# Patient Record
Sex: Male | Born: 1960 | Race: White | Hispanic: No | Marital: Married | Smoking: Former smoker
Health system: Southern US, Community
[De-identification: ages and names within clinical notes are randomized; demographics above are authoritative.]

## PROBLEM LIST (undated history)

## (undated) DIAGNOSIS — C3490 Malignant neoplasm of unspecified part of unspecified bronchus or lung: Secondary | ICD-10-CM

## (undated) DIAGNOSIS — J449 Chronic obstructive pulmonary disease, unspecified: Secondary | ICD-10-CM

## (undated) DIAGNOSIS — E785 Hyperlipidemia, unspecified: Secondary | ICD-10-CM

## (undated) DIAGNOSIS — M2550 Pain in unspecified joint: Secondary | ICD-10-CM

## (undated) DIAGNOSIS — E119 Type 2 diabetes mellitus without complications: Secondary | ICD-10-CM

## (undated) DIAGNOSIS — G4730 Sleep apnea, unspecified: Secondary | ICD-10-CM

## (undated) DIAGNOSIS — Z8709 Personal history of other diseases of the respiratory system: Secondary | ICD-10-CM

## (undated) HISTORY — PX: WISDOM TOOTH EXTRACTION: SHX21

## (undated) HISTORY — DX: Type 2 diabetes mellitus without complications: E11.9

## (undated) HISTORY — PX: BRONCHOSCOPY: SUR163

## (undated) HISTORY — DX: Chronic obstructive pulmonary disease, unspecified: J44.9

---
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
## 1999-08-24 HISTORY — PX: VASECTOMY: SHX75

## 2014-11-27 ENCOUNTER — Ambulatory Visit
Admit: 2014-11-27 | Disposition: A | Discharge status: Home / Self Care | Payer: Self-pay | Attending: Unknown Physician Specialty | Admitting: Unknown Physician Specialty

## 2014-12-20 ENCOUNTER — Ambulatory Visit
Admit: 2014-12-20 | Disposition: A | Discharge status: Home / Self Care | Payer: Self-pay | Attending: Unknown Physician Specialty | Admitting: Unknown Physician Specialty

## 2014-12-26 ENCOUNTER — Inpatient Hospital Stay
Payer: No Typology Code available for payment source | Attending: Cardiothoracic Surgery | Admitting: Cardiothoracic Surgery

## 2014-12-26 ENCOUNTER — Encounter (INDEPENDENT_AMBULATORY_CARE_PROVIDER_SITE_OTHER): Payer: Self-pay

## 2014-12-26 ENCOUNTER — Encounter: Payer: Self-pay | Admitting: Cardiothoracic Surgery

## 2014-12-26 VITALS — BP 138/82 | HR 63 | Temp 97.8°F | Resp 18 | Ht 71.0 in | Wt 208.3 lb

## 2014-12-26 DIAGNOSIS — R918 Other nonspecific abnormal finding of lung field: Secondary | ICD-10-CM | POA: Diagnosis not present

## 2014-12-26 NOTE — Progress Notes (Signed)
Patient ID: Timothy Benitez, male   DOB: [birth year], 54 y.o.   MRN: [id number]  Chief Complaint  Patient presents with  . PT Initial Evaluation    Abnormal Chest CT    HPI Location, Quality, Duration, Severity, Timing, Context, Modifying Factors, Associated Signs and Symptoms.  Timothy Benitez is a 54 y.o. male.  He presents today for evaluation of a left lung mass. The patient's primary care physician is Dr. Janae Benitez and miss Timothy Benitez. Patient carries a diagnosis of COPD for the last several years. He states that he saw Timothy Benitez for evaluation of a cough. The cause of his history of COPD a chest x-ray was obtained. The chest x-ray was abnormal which led to a CT scan. The CT scan showed a left upper lobe mass with atelectasis of the left upper lobe. The patient is sent here for further evaluation. This has been going on for the last several weeks. There are no associated symptoms. He denied any significant hemoptysis weight loss fever or chills. He denies any chest pain. The cough has now abated.  He does have a history of smoking 1-2 packs cigarettes a day. He quit smoking about a year ago but currently smokes cigars. He drinks 1-2 alcoholic drinks per week. He is a Administrator. HPI  Past Medical History  Diagnosis Date  . COPD (chronic obstructive pulmonary disease)   . Heart murmur     Past Surgical History  Procedure Laterality Date  . Tonsilectomy 1969    . Wisdom teeth extraction 1989      Family History  Problem Relation Age of Onset  . Leukemia Father     Social History History  Substance Use Topics  . Smoking status: Former Smoker -- 1.50 packs/day for 34 years    Types: Cigarettes    Quit date: 09/27/2013  . Smokeless tobacco: Never Used  . Alcohol Use: 0.6 oz/week    1 Cans of beer per week     Comment: 2-3 every couple weeks    No Known Allergies  Current Outpatient Prescriptions  Medication Sig Dispense Refill  . atorvastatin (LIPITOR) 20 MG tablet  Take 20 mg by mouth daily.    . budesonide-formoterol (SYMBICORT) 160-4.5 MCG/ACT inhaler Inhale 2 puffs into the lungs 2 (two) times daily.     No current facility-administered medications for this visit.      Review of Systems A 10 point review of systems was asked and was negative except for the following positive findings cough, wheezing. All other review of systems were negative  Blood pressure 138/82, pulse 63, temperature 97.8 F (36.6 C), temperature source Oral, resp. rate 18, height '5\' 11"'$  (1.803 m), weight 208 lb 5.4 oz (94.5 kg), SpO2 98 %.  Physical Exam CONSTITUTIONAL:  Pleasant, well-developed, well-nourished, and in no acute distress. EYES: Pupils equal and reactive to light, Sclera non-icteric EARS, NOSE, MOUTH AND THROAT:  The oropharynx was clear.  Dentition is poor repair.  Oral mucosa pink and moist. LYMPH NODES:  Lymph nodes in the neck and axillae were normal RESPIRATORY:  Lungs were clear with the exception of rhonchi present on the left posteriorly.  Normal respiratory effort without pathologic use of accessory muscles of respiration CARDIOVASCULAR: Heart was regular without murmurs.  There were no carotid bruits. GI: The abdomen was soft, nontender, and nondistended. There were no palpable masses. There was no hepatosplenomegaly. There were normal bowel sounds in all quadrants. GU:  Rectal deferred.   MUSCULOSKELETAL:  Normal muscle strength  and tone.  No clubbing or cyanosis.   SKIN:  There were no pathologic skin lesions.  There were no nodules on palpation with the exception of a 2-3 cm sebaceous cyst just off the midline at approximately the third intercostal space on the right. NEUROLOGIC:  Sensation is normal.  Cranial nerves are grossly intact. PSYCH:  Oriented to person, place and time.  Mood and affect are normal.  Data Reviewed I have personally reviewed the patient's imaging.    Assessment    I have independently reviewed the patient's chest CT.  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    Plan    At the present time I would like to obtain a bronchoscopy by Dr. Louie Bun. I would also like to get a complete set of pulmonary function studies. The patient will return in one week to discuss these findings.        [name] 12/26/2014, 12:50 PM

## 2014-12-26 NOTE — Progress Notes (Signed)
Met with patient at thoracic surgery consultation. Reviewed plan of care and introduced navigation program. Will assist in coordination of care, including arranging for bronchoscopy vs EBUS by Dr. Mortimer Fries.

## 2014-12-30 ENCOUNTER — Other Ambulatory Visit: Payer: No Typology Code available for payment source

## 2014-12-30 ENCOUNTER — Ambulatory Visit: Payer: No Typology Code available for payment source | Attending: Cardiothoracic Surgery

## 2014-12-30 ENCOUNTER — Telehealth: Payer: Self-pay | Admitting: *Deleted

## 2014-12-30 DIAGNOSIS — R918 Other nonspecific abnormal finding of lung field: Secondary | ICD-10-CM | POA: Diagnosis not present

## 2014-12-30 NOTE — Telephone Encounter (Signed)
Notified of preadmission call planned for 5/11 from 1-5pm and procedure on 5/12. Verbalized understanding.

## 2015-01-01 ENCOUNTER — Inpatient Hospital Stay: Admission: RE | Admit: 2015-01-01 | Payer: No Typology Code available for payment source | Source: Ambulatory Visit

## 2015-01-01 ENCOUNTER — Encounter: Payer: Self-pay | Admitting: *Deleted

## 2015-01-01 DIAGNOSIS — D3A090 Benign carcinoid tumor of the bronchus and lung: Secondary | ICD-10-CM | POA: Diagnosis not present

## 2015-01-01 DIAGNOSIS — Z79899 Other long term (current) drug therapy: Secondary | ICD-10-CM | POA: Diagnosis not present

## 2015-01-01 DIAGNOSIS — R918 Other nonspecific abnormal finding of lung field: Secondary | ICD-10-CM | POA: Diagnosis present

## 2015-01-01 DIAGNOSIS — Z9889 Other specified postprocedural states: Secondary | ICD-10-CM | POA: Diagnosis not present

## 2015-01-01 DIAGNOSIS — J449 Chronic obstructive pulmonary disease, unspecified: Secondary | ICD-10-CM | POA: Diagnosis not present

## 2015-01-01 DIAGNOSIS — Z806 Family history of leukemia: Secondary | ICD-10-CM | POA: Diagnosis not present

## 2015-01-01 DIAGNOSIS — F17290 Nicotine dependence, other tobacco product, uncomplicated: Secondary | ICD-10-CM | POA: Diagnosis not present

## 2015-01-01 DIAGNOSIS — R011 Cardiac murmur, unspecified: Secondary | ICD-10-CM | POA: Diagnosis not present

## 2015-01-01 NOTE — Patient Instructions (Signed)
  Your procedure is scheduled on: 01-01-15 Report to [facility] 2nd floor To find out your arrival time please call [phone] between 1PM - 3PM on  01-01-15  Remember: Instructions that are not followed completely may result in serious medical risk, up to and including death, or upon the discretion of your surgeon and anesthesiologist your surgery may need to be rescheduled.    __x__ 1. Do not eat food or drink liquids after midnight. No gum chewing or hard candies.     __x__ 2. No Alcohol for 24 hours before or after surgery.   ____ 3. Bring all medications with you on the day of surgery if instructed.    ____ 4. Notify your doctor if there is any change in your medical condition     (cold, fever, infections).     Do not wear jewelry, make-up, hairpins, clips or nail polish.  Do not wear lotions, powders, or perfumes. You may wear deodorant.  Do not shave 48 hours prior to surgery. Men may shave face and neck.  Do not bring valuables to the hospital.    [facility] is not responsible for any belongings or valuables.               Contacts, dentures or bridgework may not be worn into surgery.  Leave your suitcase in the car. After surgery it may be brought to your room.  For patients admitted to the hospital, discharge time is determined by your treatment team.   Patients discharged the day of surgery will not be allowed to drive home.   Please read over the following fact sheets that you were given:     __x__ Take these medicines the morning of surgery with A SIP OF WATER:    1.Symbicort  2.   3.   4.  5.  6.  ____ Fleet Enema (as directed)   ____ Use CHG Soap as directed  ____ Use inhalers on the day of surgery  ____ Stop metformin 2 days prior to surgery    ____ Take 1/2 of usual insulin dose the night before surgery and none on the morning of surgery.   ____ Stop Coumadin/Plavix/aspirin on   ____ Stop Anti-inflammatories on    ____ Stop  supplements until after surgery.    ____ Bring C-Pap to the hospital.

## 2015-01-02 ENCOUNTER — Ambulatory Visit
Admission: RE | Admit: 2015-01-02 | Discharge: 2015-01-02 | Disposition: A | Discharge status: Home / Self Care | Payer: No Typology Code available for payment source | Source: Ambulatory Visit | Attending: Internal Medicine | Admitting: Internal Medicine

## 2015-01-02 ENCOUNTER — Inpatient Hospital Stay: Payer: No Typology Code available for payment source | Admitting: Anesthesiology

## 2015-01-02 ENCOUNTER — Encounter: Payer: Self-pay | Admitting: *Deleted

## 2015-01-02 ENCOUNTER — Encounter
Admission: RE | Disposition: A | Discharge status: Home / Self Care | Payer: Self-pay | Source: Ambulatory Visit | Attending: Internal Medicine

## 2015-01-02 ENCOUNTER — Ambulatory Visit: Payer: Self-pay | Admitting: Cardiothoracic Surgery

## 2015-01-02 DIAGNOSIS — R011 Cardiac murmur, unspecified: Secondary | ICD-10-CM | POA: Insufficient documentation

## 2015-01-02 DIAGNOSIS — J449 Chronic obstructive pulmonary disease, unspecified: Secondary | ICD-10-CM | POA: Insufficient documentation

## 2015-01-02 DIAGNOSIS — J432 Centrilobular emphysema: Secondary | ICD-10-CM

## 2015-01-02 DIAGNOSIS — Z79899 Other long term (current) drug therapy: Secondary | ICD-10-CM | POA: Insufficient documentation

## 2015-01-02 DIAGNOSIS — D3A090 Benign carcinoid tumor of the bronchus and lung: Secondary | ICD-10-CM | POA: Diagnosis not present

## 2015-01-02 DIAGNOSIS — Z806 Family history of leukemia: Secondary | ICD-10-CM | POA: Insufficient documentation

## 2015-01-02 DIAGNOSIS — Z9889 Other specified postprocedural states: Secondary | ICD-10-CM | POA: Insufficient documentation

## 2015-01-02 DIAGNOSIS — F17290 Nicotine dependence, other tobacco product, uncomplicated: Secondary | ICD-10-CM | POA: Insufficient documentation

## 2015-01-02 DIAGNOSIS — R918 Other nonspecific abnormal finding of lung field: Secondary | ICD-10-CM

## 2015-01-02 HISTORY — DX: Sleep apnea, unspecified: G47.30

## 2015-01-02 HISTORY — PX: ENDOBRONCHIAL ULTRASOUND: SHX5096

## 2015-01-02 SURGERY — ENDOBRONCHIAL ULTRASOUND (EBUS)
Anesthesia: General

## 2015-01-02 MED ORDER — FENTANYL CITRATE (PF) 100 MCG/2ML IJ SOLN: 25.0000 ug | INTRAMUSCULAR | Status: DC | PRN

## 2015-01-02 MED ORDER — FAMOTIDINE 20 MG PO TABS
20.0000 mg | ORAL_TABLET | Freq: Once | ORAL | Status: AC
  Administered 2015-01-02: 20 mg via ORAL

## 2015-01-02 MED ORDER — FAMOTIDINE 20 MG PO TABS
ORAL_TABLET | ORAL | Status: AC
  Administered 2015-01-02: 20 mg via ORAL
  Filled 2015-01-02: qty 1

## 2015-01-02 MED ORDER — LACTATED RINGERS IV SOLN: INTRAVENOUS | Status: DC

## 2015-01-02 MED ORDER — LIDOCAINE HCL (CARDIAC) 20 MG/ML IV SOLN
INTRAVENOUS | Status: DC | PRN
  Administered 2015-01-02 (×2): 60 mg via INTRAVENOUS

## 2015-01-02 MED ORDER — SUCCINYLCHOLINE CHLORIDE 20 MG/ML IJ SOLN
INTRAMUSCULAR | Status: DC | PRN
  Administered 2015-01-02: 100 mg via INTRAVENOUS

## 2015-01-02 MED ORDER — LACTATED RINGERS IV SOLN
INTRAVENOUS | Status: DC
  Administered 2015-01-02: 12:00:00 via INTRAVENOUS

## 2015-01-02 MED ORDER — FAMOTIDINE 20 MG PO TABS: 20.0000 mg | ORAL_TABLET | Freq: Once | ORAL | Status: DC

## 2015-01-02 MED ORDER — MIDAZOLAM HCL 2 MG/2ML IJ SOLN
INTRAMUSCULAR | Status: DC | PRN
  Administered 2015-01-02: 2 mg via INTRAVENOUS

## 2015-01-02 MED ORDER — FENTANYL CITRATE (PF) 100 MCG/2ML IJ SOLN
INTRAMUSCULAR | Status: DC | PRN
  Administered 2015-01-02: 50 ug via INTRAVENOUS
  Administered 2015-01-02: 100 ug via INTRAVENOUS

## 2015-01-02 MED ORDER — ONDANSETRON HCL 4 MG/2ML IJ SOLN
INTRAMUSCULAR | Status: DC | PRN
  Administered 2015-01-02: 4 mg via INTRAVENOUS

## 2015-01-02 MED ORDER — ONDANSETRON HCL 4 MG/2ML IJ SOLN: 4.0000 mg | Freq: Once | INTRAMUSCULAR | Status: DC | PRN

## 2015-01-02 MED ORDER — ROCURONIUM BROMIDE 100 MG/10ML IV SOLN
INTRAVENOUS | Status: DC | PRN
  Administered 2015-01-02: 10 mg via INTRAVENOUS

## 2015-01-02 MED ORDER — PROPOFOL 10 MG/ML IV BOLUS
INTRAVENOUS | Status: DC | PRN
  Administered 2015-01-02: 200 mg via INTRAVENOUS

## 2015-01-02 NOTE — Anesthesia Postprocedure Evaluation (Signed)
  Anesthesia Post-op Note  Patient: Timothy Benitez  Procedure(s) Performed: Procedure(s): ENDOBRONCHIAL ULTRASOUND (N/A)  Anesthesia type:General  Patient location: PACU  Post pain: Pain level controlled  Post assessment: Post-op Vital signs reviewed, Patient's Cardiovascular Status Stable, Respiratory Function Stable, Patent Airway and No signs of Nausea or vomiting  Post vital signs: Reviewed and stable  Last Vitals:  Filed Vitals:   01/02/15 1354  BP:   Pulse:   Temp: 36.5 C  Resp:     Level of consciousness: awake, alert  and patient cooperative  Complications: No apparent anesthesia complications

## 2015-01-02 NOTE — Transfer of Care (Signed)
Immediate Anesthesia Transfer of Care Note  Patient: Timothy Benitez  Procedure(s) Performed: Procedure(s): ENDOBRONCHIAL ULTRASOUND (N/A)  Patient Location: PACU  Anesthesia Type:General  Level of Consciousness: awake, alert  and oriented  Airway & Oxygen Therapy: Patient Spontanous Breathing and Patient connected to face mask oxygen  Post-op Assessment: Report given to RN, Post -op Vital signs reviewed and stable and Patient moving all extremities X 4  Post vital signs: Reviewed and stable  Last Vitals:  Filed Vitals:   01/02/15 1102  BP: 128/72  Pulse: 69  Temp: 36.3 C  Resp: 16    Complications: No apparent anesthesia complications

## 2015-01-02 NOTE — Anesthesia Preprocedure Evaluation (Signed)
Anesthesia Evaluation  Patient identified by MRN, date of birth, ID band Patient awake    Reviewed: Allergy & Precautions, NPO status , Patient's Chart, lab work & pertinent test results, reviewed documented beta blocker date and time   Airway Mallampati: III  TM Distance: >3 FB Neck ROM: Full    Dental  (+) Chipped, Missing   Pulmonary sleep apnea , COPDformer smoker,          Cardiovascular + Valvular Problems/Murmurs     Neuro/Psych    GI/Hepatic   Endo/Other    Renal/GU      Musculoskeletal   Abdominal   Peds  Hematology   Anesthesia Other Findings Very poor teeth  Reproductive/Obstetrics                             Anesthesia Physical Anesthesia Plan  ASA: III  Anesthesia Plan: General   Post-op Pain Management:    Induction: Intravenous  Airway Management Planned: LMA and Oral ETT  Additional Equipment:   Intra-op Plan:   Post-operative Plan:   Informed Consent: I have reviewed the patients History and Physical, chart, labs and discussed the procedure including the risks, benefits and alternatives for the proposed anesthesia with the patient or authorized representative who has indicated his/her understanding and acceptance.     Plan Discussed with: CRNA and Surgeon  Anesthesia Plan Comments:         Anesthesia Quick Evaluation

## 2015-01-02 NOTE — Discharge Instructions (Signed)
Flexible Bronchoscopy, Care After Refer to this sheet in the next few weeks. These instructions provide you with information on caring for yourself after your procedure. Your health care provider may also give you more specific instructions. Your treatment has been planned according to current medical practices, but problems sometimes occur. Call your health care provider if you have any problems or questions after your procedure.  WHAT TO EXPECT AFTER THE PROCEDURE It is normal to have the following symptoms for 24-48 hours after the procedure:   Increased cough.  Low-grade fever.  Sore throat or hoarse voice.  Small streaks of blood in your thick spit (sputum) if tissue samples were taken (biopsy). HOME CARE INSTRUCTIONS   Do not eat or drink anything for 2 hours after your procedure. Your nose and throat were numbed by medicine. If you try to eat or drink before the medicine wears off, food or drink could go into your lungs or you could burn yourself. After the numbness is gone and your cough and gag reflexes have returned, you may eat soft food and drink liquids slowly.   The day after the procedure, you can go back to your normal diet.   You may resume normal activities.   Keep all follow-up visits as directed by your health care provider. It is important to keep all your appointments, especially if tissue samples were taken for testing (biopsy). SEEK IMMEDIATE MEDICAL CARE IF:   You have increasing shortness of breath.   You become light-headed or faint.   You have chest pain.   You have any new concerning symptoms.  You cough up more than a small amount of blood.  The amount of blood you cough up increases. MAKE SURE YOU:  Understand these instructions.  Will watch your condition.  Will get help right away if you are not doing well or get worse. Document Released: 02/26/2005 Document Revised: 12/24/2013 Document Reviewed: 04/13/2013 [facility] Patient Information  2015 [place], [place]. This information is not intended to replace advice given to you by your health care provider. Make sure you discuss any questions you have with your health care provider. Flexible Bronchoscopy, Care After Refer to this sheet in the next few weeks. These instructions provide you with information on caring for yourself after your procedure. Your health care provider may also give you more specific instructions. Your treatment has been planned according to current medical practices, but problems sometimes occur. Call your health care provider if you have any problems or questions after your procedure.  WHAT TO EXPECT AFTER THE PROCEDURE It is normal to have the following symptoms for 24-48 hours after the procedure:   Increased cough.  Low-grade fever.  Sore throat or hoarse voice.  Small streaks of blood in your thick spit (sputum) if tissue samples were taken (biopsy). HOME CARE INSTRUCTIONS   Do not eat or drink anything for 2 hours after your procedure. Your nose and throat were numbed by medicine. If you try to eat or drink before the medicine wears off, food or drink could go into your lungs or you could burn yourself. After the numbness is gone and your cough and gag reflexes have returned, you may eat soft food and drink liquids slowly.   The day after the procedure, you can go back to your normal diet.   You may resume normal activities.   Keep all follow-up visits as directed by your health care provider. It is important to keep all your appointments, especially if tissue  samples were taken for testing (biopsy). SEEK IMMEDIATE MEDICAL CARE IF:   You have increasing shortness of breath.   You become light-headed or faint.   You have chest pain.   You have any new concerning symptoms.  You cough up more than a small amount of blood.  The amount of blood you cough up increases. MAKE SURE YOU:  Understand these instructions.  Will watch your  condition.  Will get help right away if you are not doing well or get worse. Document Released: 02/26/2005 Document Revised: 12/24/2013 Document Reviewed: 04/13/2013 [facility] Patient Information 2015 [place], [place]. This information is not intended to replace advice given to you by your health care provider. Make sure you discuss any questions you have with your health care provider. General Anesthesia, Care After Refer to this sheet in the next few weeks. These instructions provide you with information on caring for yourself after your procedure. Your health care provider may also give you more specific instructions. Your treatment has been planned according to current medical practices, but problems sometimes occur. Call your health care provider if you have any problems or questions after your procedure. WHAT TO EXPECT AFTER THE PROCEDURE After the procedure, it is typical to experience:  Sleepiness.  Nausea and vomiting. HOME CARE INSTRUCTIONS  For the first 24 hours after general anesthesia:  Have a responsible person with you.  Do not drive a car. If you are alone, do not take public transportation.  Do not drink alcohol.  Do not take medicine that has not been prescribed by your health care provider.  Do not sign important papers or make important decisions.  You may resume a normal diet and activities as directed by your health care provider.  Change bandages (dressings) as directed.  If you have questions or problems that seem related to general anesthesia, call the hospital and ask for the anesthetist or anesthesiologist on call. SEEK MEDICAL CARE IF:  You have nausea and vomiting that continue the day after anesthesia.  You develop a rash. SEEK IMMEDIATE MEDICAL CARE IF:   You have difficulty breathing.  You have chest pain.  You have any allergic problems. Document Released: 11/15/2000 Document Revised: 08/14/2013 Document Reviewed: 02/22/2013 [facility]  Patient Information 2015 [place], [place]. This information is not intended to replace advice given to you by your health care provider. Make sure you discuss any questions you have with your health care provider.

## 2015-01-02 NOTE — H&P (View-Only) (Signed)
Patient ID: Timothy Benitez, male   DOB: 1961/03/07, 54 y.o.   MRN: [id number]  Chief Complaint  Patient presents with  . PT Initial Evaluation    Abnormal Chest CT    HPI Location, Quality, Duration, Severity, Timing, Context, Modifying Factors, Associated Signs and Symptoms.  Timothy Benitez is a 54 y.o. male.  He presents today for evaluation of a left lung mass. The patient's primary care physician is Dr. Janae Bridgeman and miss [name]. Patient carries a diagnosis of COPD for the last several years. He states that he saw [name] for evaluation of a cough. The cause of his history of COPD a chest x-ray was obtained. The chest x-ray was abnormal which led to a CT scan. The CT scan showed a left upper lobe mass with atelectasis of the left upper lobe. The patient is sent here for further evaluation. This has been going on for the last several weeks. There are no associated symptoms. He denied any significant hemoptysis weight loss fever or chills. He denies any chest pain. The cough has now abated.  He does have a history of smoking 1-2 packs cigarettes a day. He quit smoking about a year ago but currently smokes cigars. He drinks 1-2 alcoholic drinks per week. He is a Administrator. HPI  Past Medical History  Diagnosis Date  . COPD (chronic obstructive pulmonary disease)   . Heart murmur     Past Surgical History  Procedure Laterality Date  . Tonsilectomy 1969    . Wisdom teeth extraction 1989      Family History  Problem Relation Age of Onset  . Leukemia Father     Social History History  Substance Use Topics  . Smoking status: Former Smoker -- 1.50 packs/day for 34 years    Types: Cigarettes    Quit date: 09/27/2013  . Smokeless tobacco: Never Used  . Alcohol Use: 0.6 oz/week    1 Cans of beer per week     Comment: 2-3 every couple weeks    No Known Allergies  Current Outpatient Prescriptions  Medication Sig Dispense Refill  . atorvastatin (LIPITOR) 20 MG tablet  Take 20 mg by mouth daily.    . budesonide-formoterol (SYMBICORT) 160-4.5 MCG/ACT inhaler Inhale 2 puffs into the lungs 2 (two) times daily.     No current facility-administered medications for this visit.      Review of Systems A 10 point review of systems was asked and was negative except for the following positive findings cough, wheezing. All other review of systems were negative  Blood pressure 138/82, pulse 63, temperature 97.8 F (36.6 C), temperature source Oral, resp. rate 18, height '5\' 11"'$  (1.803 m), weight 208 lb 5.4 oz (94.5 kg), SpO2 98 %.  Physical Exam CONSTITUTIONAL:  Pleasant, well-developed, well-nourished, and in no acute distress. EYES: Pupils equal and reactive to light, Sclera non-icteric EARS, NOSE, MOUTH AND THROAT:  The oropharynx was clear.  Dentition is poor repair.  Oral mucosa pink and moist. LYMPH NODES:  Lymph nodes in the neck and axillae were normal RESPIRATORY:  Lungs were clear with the exception of rhonchi present on the left posteriorly.  Normal respiratory effort without pathologic use of accessory muscles of respiration CARDIOVASCULAR: Heart was regular without murmurs.  There were no carotid bruits. GI: The abdomen was soft, nontender, and nondistended. There were no palpable masses. There was no hepatosplenomegaly. There were normal bowel sounds in all quadrants. GU:  Rectal deferred.   MUSCULOSKELETAL:  Normal muscle strength  and tone.  No clubbing or cyanosis.   SKIN:  There were no pathologic skin lesions.  There were no nodules on palpation with the exception of a 2-3 cm sebaceous cyst just off the midline at approximately the third intercostal space on the right. NEUROLOGIC:  Sensation is normal.  Cranial nerves are grossly intact. PSYCH:  Oriented to person, place and time.  Mood and affect are normal.  Data Reviewed I have personally reviewed the patient's imaging.    Assessment    I have independently reviewed the patient's chest CT.  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    Plan    At the present time I would like to obtain a bronchoscopy by Dr. Louie Bun. I would also like to get a complete set of pulmonary function studies. The patient will return in one week to discuss these findings.        Timothy Benitez 12/26/2014, 12:50 PM

## 2015-01-02 NOTE — Interval H&P Note (Signed)
History and Physical Interval Note:  01/02/2015 11:36 AM  Timothy Benitez  has presented today for surgery, with the diagnosis of LEFT LUNG MASS  The various methods of treatment have been discussed with the patient. After consideration of risks, benefits and other options for treatment, the patient has consented to  Procedure(s): ENDOBRONCHIAL ULTRASOUND (N/A)/Bronchoscopy  as a surgical intervention .  The patient's history has been reviewed, patient examined, no change in status, stable for surgery.  I have reviewed the patient's chart and labs.  Questions were answered to the patient's satisfaction.     Flora Lipps

## 2015-01-02 NOTE — Anesthesia Procedure Notes (Signed)
Procedure Name: Intubation Date/Time: 01/02/2015 12:32 PM Performed by: Delaney Meigs Pre-anesthesia Checklist: Patient identified, Emergency Drugs available, Suction available, Patient being monitored and Timeout performed Patient Re-evaluated:Patient Re-evaluated prior to inductionOxygen Delivery Method: Circle system utilized Preoxygenation: Pre-oxygenation with 100% oxygen Intubation Type: IV induction Ventilation: Mask ventilation without difficulty Laryngoscope Size: Mac and 3 Grade View: Grade II Tube type: Oral Tube size: 8.5 mm Number of attempts: 1 Placement Confirmation: ETT inserted through vocal cords under direct vision,  positive ETCO2 and breath sounds checked- equal and bilateral Secured at: 23 cm Tube secured with: Tape Dental Injury: Teeth and Oropharynx as per pre-operative assessment  Comments: Teeth in extremely poor conditions

## 2015-01-02 NOTE — Op Note (Signed)
  PROCEDURE: BRONCHOSCOPY Therapeutic Aspiration of Tracheobronchial Tree  PROCEDURE DATE: 01/02/2015    NAME:  Timothy Benitez  DOB:09-05-1960  MRN: [id number] LOC:  ARPO/None    HOSP DAY: '@LENGTHOFSTAYDAYS'$ @ CODE STATUS:        Indications/Preliminary Diagnosis:   Consent: (Place X beside choice/s below)  The benefits, risks and possible complications of the procedure were        explained to:  _x__ patient  ___ patient's family  ___ other:___________  who verbalized understanding and gave:  ___ verbal  ___ written  _x__ verbal and written  ___ telephone  ___ other:________ consent.      Unable to obtain consent; procedure performed on emergent basis.     Other:       PRESEDATION ASSESSMENT: History and Physical has been performed. Patient meds and allergies have been reviewed. Presedation airway examination has been performed and documented. Baseline vital signs, sedation score, oxygenation status, and cardiac rhythm were reviewed. Patient was deemed to be in satisfactory condition to undergo the procedure.   PREMEDICATIONS: SEE ANESTHESIOLOGY RECORDS  Sedative/Narcotic Amt Dose   Versed  mg   Fentanyl  mcg  Diprivan  mg        Airway Prep (Place X beside choice below)   1% Transtracheal Lidocaine Anesthetization 7 cc   Patient prepped per Bronchoscopy Lab Policy       Insertion Route (Place X beside choice below)   Nasal   Oral   Endotracheal Tube   Tracheostomy   INTRAPROCEDURE MEDICATIONS:  Sedative/Narcotic Amt Dose   Versed  mg   Fentanyl  mcg  Diprivan  mg       Medication Amt Dose  Medication Amt Dose  Lidocaine 1%  cc  Epinephrine 1:10,000 sol  cc  Xylocaine 4%  cc  Cocaine  cc   TECHNICAL PROCEDURES: (Place X beside choice below)   Procedures  Description    None     Electrocautery     Cryotherapy     Balloon Dilatation     Bronchography     Stent Placement     Therapeutic Aspiration     Laser/Argon Plasma    Brachytherapy Catheter  Placement    Foreign Body Removal     PROCEDURE DETAILS: Timeout performed and correct patient, name, & ID confirmed. Following prep per Pulmonary policy, appropriate sedation was administered. The Bronchoscope was inserted in to endotracheal tube. The Airway was inspected(Rt lung and Left Lung). There Was a large Endobronchial Tumor/mass located at proximal orifice of Left upper Lobe with t 100% occlusion of airway. The lesion was friable and bleeds easily. Airway exam proceeded with findings, technical procedures, and specimen collection as noted below. At the end of exam the scope was withdrawn without incident. Impression and Plan as noted below.       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 Pulmonary & [place] Director Intensive Care Unit

## 2015-01-06 LAB — SURGICAL PATHOLOGY

## 2015-01-09 ENCOUNTER — Encounter: Payer: Self-pay | Admitting: Cardiothoracic Surgery

## 2015-01-09 ENCOUNTER — Inpatient Hospital Stay: Payer: No Typology Code available for payment source | Admitting: Cardiothoracic Surgery

## 2015-01-09 VITALS — BP 136/78 | HR 71 | Temp 97.0°F | Resp 20 | Ht 71.0 in | Wt 205.0 lb

## 2015-01-09 DIAGNOSIS — R918 Other nonspecific abnormal finding of lung field: Secondary | ICD-10-CM | POA: Diagnosis not present

## 2015-01-09 DIAGNOSIS — C3490 Malignant neoplasm of unspecified part of unspecified bronchus or lung: Secondary | ICD-10-CM

## 2015-01-09 DIAGNOSIS — C3412 Malignant neoplasm of upper lobe, left bronchus or lung: Secondary | ICD-10-CM

## 2015-01-09 NOTE — Addendum Note (Signed)
Addended by: Nestor Lewandowsky E on: 01/09/2015 10:54 AM   Modules accepted: Orders

## 2015-01-09 NOTE — Progress Notes (Signed)
Patient ID: Timothy Benitez, male   DOB: [birth year], 54 y.o.   MRN: [id number]  HISTORY: He states that he feels better today and he has a long time. He feels better after his bronchoscopy with more respiratory reserve. He was able to perform activities that he's not been able to perform in quite a while. Denied any fevers or chills or hemoptysis.   PERTINENT REVIEW OF SYSTEMS: ROS He does have more functional capacity and he has in the past. He denied any fevers chills. He does have some shortness of breath but it is not as severe. He has had no cough. He does describe a smooth strange feeling anteriorly along his left anterior chest wall after the bronchoscopy with this too has improved.  Filed Vitals:   01/09/15 0829  BP: 136/78  Pulse: 71  Temp: 97 F (36.1 C)  Resp: 20   Wt Readings from Last 3 Encounters:  01/09/15 92.987 kg (205 lb)  01/02/15 95.255 kg (210 lb)  12/26/14 94.5 kg (208 lb 5.4 oz)    EXAM: Head: Normocephalic and atraumatic.  Eyes:  Conjunctivae are normal. Pupils are equal, round, and reactive to light. No scleral icterus.  Neck:  Normal range of motion. Neck supple. No tracheal deviation present. No thyromegaly present.  Resp: Lungs are clear bilaterally with bibasilar rhonchi.  No respiratory distress, normal effort. Heart:  Regular without murmurs Abd:  Abdomen is soft, non distended and non tender. No masses are palpable.  There is no rebound and no guarding.  Neurological: Alert and oriented to person, place, and time. Coordination normal.  Skin: Skin is warm and dry. No rash noted. No diaphoretic. No erythema. No pallor.  Psychiatric: Normal mood and affect. Normal behavior. Judgment and thought content normal.      ASSESSMENT: I had a long discussion with the patient today. I did share with him the bronchoscopy results. 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 I was present for his bronchoscopy last week. The tumor is just within the  orifice of the left upper lobe. We attempted to pass a subsequent round the tumor to see if it was attached more distally in the bronchus but we could not do so. This would imply that the tumor is actually right at the orifice to the left upper lobe. It may not be possible to perform a standard lobectomy and may require a sleeve resection and/or a pneumonectomy. I told the patient that I would like to refer him to Dr. Ceasar Mons at [facility]. I explained to the patient that they have excellent facilities at [facility]. I did review with him the options. I would like to continue his follow-up care here at [facility]. We discussed the operative morbidity and mortality of the various options. I told him that Dr. Servando Snare would need to determine some of these intraoperatively as to how he will proceed. I also discussed with him the nonsurgical options which are poor.  PLAN:   We will obtain a PET scan today. We will also refer the patient to Dr. Ceasar Mons for surgical consultation. I would like to continue his follow-up. [facility] postoperatively if Dr. Servando Snare is agreeable.    Nestor Lewandowsky, [place] at [facility] Follow Up Visit

## 2015-01-10 ENCOUNTER — Telehealth: Payer: Self-pay | Admitting: *Deleted

## 2015-01-10 NOTE — Telephone Encounter (Signed)
Oncology Nurse Navigator Documentation  Oncology Nurse Navigator Flowsheets 01/10/2015  Navigator Encounter Type Telephone  Patient Visit Type Surgery  Time Spent with Patient 30   Per patient request discussed plan of care with sister.

## 2015-01-14 ENCOUNTER — Other Ambulatory Visit: Payer: Self-pay | Admitting: Cardiothoracic Surgery

## 2015-01-14 ENCOUNTER — Ambulatory Visit
Admission: RE | Admit: 2015-01-14 | Discharge: 2015-01-14 | Disposition: A | Discharge status: Home / Self Care | Payer: No Typology Code available for payment source | Source: Ambulatory Visit | Attending: Cardiothoracic Surgery | Admitting: Cardiothoracic Surgery

## 2015-01-14 ENCOUNTER — Other Ambulatory Visit: Payer: Self-pay | Admitting: *Deleted

## 2015-01-14 DIAGNOSIS — C3490 Malignant neoplasm of unspecified part of unspecified bronchus or lung: Secondary | ICD-10-CM | POA: Diagnosis not present

## 2015-01-14 DIAGNOSIS — R918 Other nonspecific abnormal finding of lung field: Secondary | ICD-10-CM

## 2015-01-14 DIAGNOSIS — M5134 Other intervertebral disc degeneration, thoracic region: Secondary | ICD-10-CM | POA: Diagnosis not present

## 2015-01-14 DIAGNOSIS — M5136 Other intervertebral disc degeneration, lumbar region: Secondary | ICD-10-CM | POA: Diagnosis not present

## 2015-01-14 DIAGNOSIS — C3412 Malignant neoplasm of upper lobe, left bronchus or lung: Secondary | ICD-10-CM

## 2015-01-14 DIAGNOSIS — J9811 Atelectasis: Secondary | ICD-10-CM | POA: Insufficient documentation

## 2015-01-14 LAB — CYTOLOGY - NON PAP

## 2015-01-14 LAB — GLUCOSE, CAPILLARY: Glucose-Capillary: 106 mg/dL — ABNORMAL HIGH (ref 65–99)

## 2015-01-14 MED ORDER — FLUDEOXYGLUCOSE F - 18 (FDG) INJECTION
12.3800 | Freq: Once | INTRAVENOUS | Status: AC | PRN
  Administered 2015-01-14: 12.38 via INTRAVENOUS

## 2015-01-17 ENCOUNTER — Encounter: Payer: Self-pay | Admitting: Cardiothoracic Surgery

## 2015-01-17 ENCOUNTER — Institutional Professional Consult (permissible substitution) (INDEPENDENT_AMBULATORY_CARE_PROVIDER_SITE_OTHER): Payer: No Typology Code available for payment source | Admitting: Cardiothoracic Surgery

## 2015-01-17 DIAGNOSIS — R918 Other nonspecific abnormal finding of lung field: Secondary | ICD-10-CM | POA: Diagnosis not present

## 2015-01-17 DIAGNOSIS — J984 Other disorders of lung: Secondary | ICD-10-CM

## 2015-01-17 NOTE — Progress Notes (Signed)
[address]       [place] [address]             [phone]                    Inocencio F Munyan [place] Medical Record #[id number] Date of Birth: Feb 03, 1961  Referring: Nestor Lewandowsky, MD Primary Care: [name], NP  Chief Complaint:    Chief Complaint  Patient presents with  . Lung Mass    Surgical evel, PET Scan 01/13/18, Chest CT 12/20/14, PFT's 12/30/14    History of Present Illness:    Timothy Benitez 54 y.o. male is seen in the office  today for recent diagnosis of carcinoid  of the left upper lobe. He was seen by primary care  for evaluation of a cough.  cause  A chest x-ray was obtained. The chest x-ray was abnormal which led to a CT scan. The CT scan showed a left upper lobe mass with atelectasis of the left upper lobe. The patient is sent here for further evaluation.  There are no associated symptoms. He denied any significant hemoptysis weight loss fever or chills. He denies any chest pain. The cough has now abated. He does have a history of smoking 1-2 packs cigarettes a day. He quit smoking  a year ago but currently smokes cigars.  1-2 alcoholic drinks per week. 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 Recent bronchoscopy done   From Bronch:  No images in EPIC :The Bronchoscope was inserted in to endotracheal tube. The Airway was inspected(Rt lung and Left Lung). There Was a large Endobronchial Tumor/mass located at proximal orifice of Left upper Lobe with t 100% occlusion of airway. The lesion was friable and bleeds easily. Airway exam proceeded with findings, technical procedures, and specimen collection as noted below. At the end of exam the scope was withdrawn without incident. Impression and Plan as noted below.       From [facility] at [place]:  A. LUNG, LEFT UPPER LOBE; BRONCHOSCOPY WITH BIOPSY:  - CARCINOID TUMOR.   Comment:  The neoplastic cells  demonstrate diffuse and strong membranous staining  with CD 56. Ki-67 labels less than 5% of neoplastic cells. Less than 2  mitoses identified in 2 mm area. Necrosis is not identified.   Current Activity/ Functional Status:  Patient is independent with mobility/ambulation, transfers, ADL's, IADL's.   Zubrod Score: At the time of surgery this patient's most appropriate activity status/level should be described as: []     0    Normal activity, no symptoms []     1    Restricted in physical strenuous activity but ambulatory, able to do out light work []     2    Ambulatory and capable of self care, unable to do work activities, up and about               >50 % of waking hours                              []     3    Only limited self care, in bed greater than 50% of waking hours []     4    Completely disabled, no self care, confined to bed or chair []     5    Moribund  Past Medical History  Diagnosis Date  . COPD (chronic obstructive pulmonary disease)   . Heart murmur   . Sleep apnea     "mild" never needed CPAP  . Carcinoid tumor of lung   . Cancer     Lung Cancer    Past Surgical History  Procedure Laterality Date  . Tonsilectomy 1969    . Wisdom teeth extraction 1989    . Vasectomy      Family History  Problem Relation Age of Onset  . Leukemia Father       History  Smoking status  . Former Smoker -- 1.50 packs/day for 34 years  . Types: Cigarettes  . Quit date: 09/27/2013  Smokeless tobacco  . Never Used    History  Alcohol Use  . 0.6 oz/week  . 1 Cans of beer per week    Comment: 2-3 every couple weeks     No Known Allergies  Current Outpatient Prescriptions  Medication Sig Dispense Refill  . atorvastatin (LIPITOR) 20 MG tablet Take 20 mg by mouth daily.    Marland Kitchen b complex vitamins tablet Take 1 tablet by mouth daily.    . budesonide-formoterol (SYMBICORT) 160-4.5 MCG/ACT inhaler Inhale 2 puffs into the lungs 2 (two) times daily.    . vitamin A 10000  UNIT capsule Take 10,000 Units by mouth daily.     No current facility-administered medications for this visit.     Review of Systems:     Cardiac Review of Systems: Y or N  Chest Pain [  N  ]  Resting SOB [ N  ] Exertional SOB  [Y  ]  Orthopnea Aqua.Slicker  ]   Pedal Edema [ N  ]    Palpitations [ N ] Syncope  [ N ]   Presyncope [N   ]  General Review of Systems: [Y] = yes [  ]=no Constitional: recent weight change [ N ];  Wt loss over the last 3 months [   ] anorexia [  ]; fatigue [  ]; nausea [  ]; night sweats Aqua.Slicker  ]; fever [  ]; or chills [  ];          Dental: poor dentition[ [place] ]; Last Dentist visit: does not know   Eye : blurred vision Aqua.Slicker  ]; diplopia [   ]; vision changes [  ];  Amaurosis fugax[  ]; Resp: cough [Y  ];  wheezing[N  ];  hemoptysis[ N ]; shortness of breath[ N ]; paroxysmal nocturnal dyspnea[  ]; dyspnea on exertion[  ]; or orthopnea[  ];  GI:  gallstones[  ], vomiting[  ];  dysphagia[  ]; melena[  ];  hematochezia [  ]; heartburn[  ];   Hx of  Colonoscopy[  N]; GU: kidney stones [  ]; hematuria[  ];   dysuria [  ];  nocturia[  ];  history of     obstruction [ N ]; urinary frequency [N  ]             Skin: rash, swelling[  ];, hair loss[  ];  peripheral edema[  ];  or itching[  ]; Musculosketetal: myalgias[  ];  joint swelling[  ];  joint erythema[  ];  joint pain[  ];  back pain[  ];  Heme/Lymph: bruising[  ];  bleeding[  ];  anemia[  ];  Neuro: TIA[  ];  headaches[  ];  stroke[  ];  vertigo[  ];  seizures[ N ];   paresthesias[  ];  difficulty walking[ N ];  Psych:depression[  ]; anxiety[  ];  Endocrine: diabetes[N  ];  thyroid dysfunction[ N ];  Immunizations: Flu up to date Aqua.Slicker  ]; Pneumococcal up to date [facility]  ];  Other:  Physical Exam: BP 135/72 mmHg  Pulse 68  Resp 20  Ht 5' 11"  (1.803 m)  Wt 207 lb (93.895 kg)  BMI 28.88 kg/m2  SpO2 93%  PHYSICAL EXAMINATION: General appearance: alert, cooperative, appears stated age and no distress Head:  Normocephalic, without obvious abnormality, atraumatic Neck: no adenopathy, no carotid bruit, no JVD, supple, symmetrical, trachea midline and thyroid not enlarged, symmetric, no tenderness/mass/nodules Lymph nodes: Cervical, supraclavicular, and axillary nodes normal. Resp: clear to auscultation bilaterally Back: symmetric, no curvature. ROM normal. No CVA tenderness. Cardio: regular rate and rhythm, S1, S2 normal, no murmur, click, rub or gallop, History of heart murmur, but I do not hear one GI: soft, non-tender; bowel sounds normal; no masses,  no organomegaly Extremities: extremities normal, atraumatic, no cyanosis or edema Neurologic: Grossly normal palpable dp and pt pulses Almost all teeth are broken or loose, several lower teeth are very loose and would be risk of dislodging with intubation and post op respiratory care.   Diagnostic Studies & Laboratory data:     Recent Radiology Findings:   Ct Chest W Contrast  12/20/2014   CLINICAL DATA:  Left lung infiltrate.  EXAM: CT CHEST WITH CONTRAST  TECHNIQUE: Multidetector CT imaging of the chest was performed during intravenous contrast administration.  CONTRAST:  75 mL of Omnipaque 350 intravenously.  COMPARISON:  None.  FINDINGS: No pneumothorax or pleural effusion is noted. There is no evidence of thoracic aortic dissection or aneurysm. Visualized portion of upper abdomen appears normal. No significant osseous abnormality is noted.  4.4 x 2.8 cm subcutaneous cyst is seen in right breast region. There is postobstructive atelectasis of the left upper lobe secondary to enhancing probable endobronchial mass at the origin of the left upper lobe bronchus which measures 1.8 x 1.5 x 1.2 cm. No other significant mediastinal mass or adenopathy is noted.  IMPRESSION: Postobstructive atelectasis of left upper lobe secondary to 1.8 cm probable endobronchial mass at the origin of the left upper lobe bronchus. This is highly concerning for neoplasm or  malignancy. Bronchoscopy is recommended for further evaluation. These results will be called to the ordering clinician or representative by the Radiologist Assistant, and communication documented in the PACS or zVision Dashboard.   Electronically Signed   By: Marijo Conception, M.D.   On: 12/20/2014 09:56   Nm Pet Image Initial (pi) Skull Base To Thigh  01/14/2015   CLINICAL DATA:  Initial treatment strategy for Lung cancer.  EXAM: NUCLEAR MEDICINE PET SKULL BASE TO THIGH  TECHNIQUE: 12.38 mCi F-18 FDG was injected intravenously. Full-ring PET imaging was performed from the skull base to thigh after the radiotracer. CT data was obtained and used for attenuation correction and anatomic localization.  FASTING BLOOD GLUCOSE:  Value: 106 mg/dl  COMPARISON:  12/20/2014  FINDINGS: NECK  No hypermetabolic lymph nodes in the neck.  CHEST  There is expected low level FDG uptake associated with the left upper lobe, biopsy proved endobronchial carcinoid. The SUV max is equal to 2.9. Associated postobstructive pneumonitis/atelectasis involving the left upper lobe is again noted.  ABDOMEN/PELVIS  No abnormal hypermetabolic activity within the liver, pancreas, adrenal glands, or spleen. No hypermetabolic lymph nodes in the abdomen or pelvis.Aortic atherosclerosis  noted.  SKELETON  There is a sclerotic lesion within the left iliac wing measuring 11 mm, image 219 of series 3. No corresponding FDG uptake noted on the PET images. Degenerative disc disease noted within the thoracic and lumbar spine.  IMPRESSION: 1. Expected low level FDG uptake is associated with the left upper lobe endobronchial carcinoid. 2. Postobstructive atelectasis involves the left upper lobe. 3. No evidence for hypermetabolic metastasis.   Electronically Signed   By: Kerby Moors M.D.   On: 01/14/2015 13:19    I have independently reviewed the above radiology studies  and reviewed the findings with the patient.    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  I have discussed with the patient the need for resection , without will result in continued obstruction of left upper lobe and potentially lower lobe.  Patient has had his questions answered and is will to proceed with resection.  The patient dental condition makes him at high risk to aspirated loose broken teeth with manipulations of airway intraop and post op and st increased risk of pulmonary infection postop. I have referred him to Dr Enrique Sack to evaluate his dental situation and proceed with necessary extractions to make pulmonary resection safer. Will proceed with lung resection in the next 2 weeks depending on dental treatment      I  spent 45 minutes counseling the patient face to face and 50% or more the  time was spent in counseling and coordination of care. The total time spent in the appointment was 60 minutes.  [name] MD      [address] ,[place] [address] Office [phone]   Beeper [phone]  01/17/2015 12:57 PM

## 2015-01-22 ENCOUNTER — Ambulatory Visit (HOSPITAL_COMMUNITY): Payer: Self-pay | Admitting: Dentistry

## 2015-01-22 ENCOUNTER — Other Ambulatory Visit (HOSPITAL_COMMUNITY): Payer: Self-pay | Admitting: Dentistry

## 2015-01-22 ENCOUNTER — Encounter (HOSPITAL_COMMUNITY): Payer: Self-pay | Admitting: Dentistry

## 2015-01-22 VITALS — BP 132/52 | HR 63 | Temp 98.0°F

## 2015-01-22 DIAGNOSIS — K08409 Partial loss of teeth, unspecified cause, unspecified class: Secondary | ICD-10-CM

## 2015-01-22 DIAGNOSIS — K045 Chronic apical periodontitis: Secondary | ICD-10-CM | POA: Insufficient documentation

## 2015-01-22 DIAGNOSIS — K029 Dental caries, unspecified: Secondary | ICD-10-CM | POA: Insufficient documentation

## 2015-01-22 DIAGNOSIS — K083 Retained dental root: Secondary | ICD-10-CM | POA: Insufficient documentation

## 2015-01-22 DIAGNOSIS — K0530 Chronic periodontitis, unspecified: Secondary | ICD-10-CM

## 2015-01-22 DIAGNOSIS — D3A090 Benign carcinoid tumor of the bronchus and lung: Secondary | ICD-10-CM | POA: Insufficient documentation

## 2015-01-22 DIAGNOSIS — Z85118 Personal history of other malignant neoplasm of bronchus and lung: Secondary | ICD-10-CM | POA: Insufficient documentation

## 2015-01-22 DIAGNOSIS — Z01818 Encounter for other preprocedural examination: Secondary | ICD-10-CM | POA: Diagnosis not present

## 2015-01-22 DIAGNOSIS — K036 Deposits [accretions] on teeth: Secondary | ICD-10-CM

## 2015-01-22 DIAGNOSIS — K0889 Other specified disorders of teeth and supporting structures: Secondary | ICD-10-CM | POA: Insufficient documentation

## 2015-01-22 DIAGNOSIS — D1430 Benign neoplasm of unspecified bronchus and lung: Secondary | ICD-10-CM

## 2015-01-22 DIAGNOSIS — M264 Malocclusion, unspecified: Secondary | ICD-10-CM

## 2015-01-22 DIAGNOSIS — IMO0002 Reserved for concepts with insufficient information to code with codable children: Secondary | ICD-10-CM

## 2015-01-22 NOTE — Progress Notes (Signed)
DENTAL CONSULTATION  Date of Consultation:  01/22/2015 Patient Name:   Timothy Benitez Date of Birth:   November 19, 1960 Medical Record Number: [id number]  VITALS: BP 132/52 mmHg  Pulse 63  Temp(Src) 98 F (36.7 C) (Oral)  CHIEF COMPLAINT: Patient referred by Dr. Servando Snare for a medically necessary pre-lung surgery dental protocol examination.  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 Patient with anticipated surgical resection with Dr. Servando Snare. Patient referred by Dr. Servando Snare for a pre-lung surgery dental protocol examination of poor dentition to rule out dental infection that may affect the patient's systemic health and the anticipated lung surgery.  The patient currently denies acute toothaches, swellings, or abscesses. Patient indicates that multiple teeth have hurt off and on for many years. Patient has not seen a dentist since approximately 1992. Patient had a "checkup" at that time. Patient did have two lower impacted wisdom teeth that were extracted by an oral surgeon. Patient denies significant complications from those extractions. Patient does not seek regular dental care. Patient is interested in having all teeth removed at this time.  PROBLEM LIST: Patient Active Problem List   Diagnosis Date Noted  . Carcinoid tumor of lung 01/22/2015    Priority: Medium  . Chronic apical periodontitis 01/22/2015  . Retained dental root 01/22/2015  . Dental caries 01/22/2015  . Chronic periodontitis 01/22/2015  . Loose, teeth 01/22/2015  . COPD (chronic obstructive pulmonary disease) 01/02/2015  . Lung mass 01/02/2015    PMH: Past Medical History  Diagnosis Date  . COPD (chronic obstructive pulmonary disease)   . Heart murmur   . Sleep apnea     "mild" never needed CPAP  . Carcinoid tumor of lung   . Cancer     Lung Cancer    PSH: Past Surgical History  Procedure Laterality Date  . Tonsilectomy 1969    . Wisdom teeth  extraction 1989    . Vasectomy      ALLERGIES: No Known Allergies  MEDICATIONS: Current Outpatient Prescriptions  Medication Sig Dispense Refill  . albuterol (PROVENTIL HFA;VENTOLIN HFA) 108 (90 BASE) MCG/ACT inhaler Inhale 1-2 puffs into the lungs every 6 (six) hours as needed for wheezing or shortness of breath.    Marland Kitchen atorvastatin (LIPITOR) 20 MG tablet Take 20 mg by mouth daily.    Marland Kitchen b complex vitamins tablet Take 1 tablet by mouth daily.    . budesonide-formoterol (SYMBICORT) 160-4.5 MCG/ACT inhaler Inhale 2 puffs into the lungs 2 (two) times daily.    . Cholecalciferol (VITAMIN D) 2000 UNITS CAPS Take 1 capsule by mouth daily.     No current facility-administered medications for this visit.    LABS: No results found for: WBC, HGB, HCT, MCV, PLT No results found for: NA, K, CL, CO2, GLUCOSE, BUN, CREATININE, CALCIUM, GFRNONAA, GFRAA No results found for: INR, PROTIME No results found for: PTT  SOCIAL HISTORY: History   Social History  . Marital Status: Married    Spouse Name: N/A  . Number of Children: 5  . Years of Education: N/A   Occupational History  . Not on file.   Social History Main Topics  . Smoking status: Former Smoker -- 1.50 packs/day for 34 years    Types: Cigarettes    Quit date: 09/27/2013  . Smokeless tobacco: Never Used  . Alcohol Use: 0.6 oz/week    1 Cans of beer per week     Comment: 2-3 beers every couple weeks  .  Drug Use: No     Comment: in 1980's and 1990's   . Sexual Activity: Not on file   Other Topics Concern  . Not on file   Social History Narrative    FAMILY HISTORY: Family History  Problem Relation Age of Onset  . Leukemia Father     REVIEW OF SYSTEMS: Reviewed with the patient and is included in the dental record.  DENTAL HISTORY: CHIEF COMPLAINT: Patient referred by Dr. Servando Snare for a medically necessary pre-lung surgery dental protocol examination.  HPI: Timothy Benitez is a 54 year old male recently diagnosed  with carcinoid tumor of the left lung. Patient with anticipated surgical resection with Dr. Servando Snare. Patient referred by Dr. Servando Snare for a pre-lung surgery dental protocol examination of poor dentition to rule out dental infection that may affect the patient's systemic health and the anticipated lung surgery.  The patient currently denies acute toothaches, swellings, or abscesses. Patient indicates that multiple teeth have hurt off and on for many years. Patient has not seen a dentist since approximately 1992. Patient had a "checkup" at that time. Patient did have two lower impacted wisdom teeth that were extracted by an oral surgeon. Patient denies significant complications from those extractions. Patient does not seek regular dental care. Patient is interested in having all teeth removed at this time.   DENTAL EXAMINATION: GENERAL: The patient is a well-developed, well-nourished male in no acute distress. HEAD AND NECK: There is no submandibular lymphadenopathy. The patient denies acute TMJ symptoms. INTRAORAL EXAM: The patient has normal saliva. There is no evidence of oral abscess formation. The patient has mandibular right lingual exostoses in the area of the premolars and area #32. DENTITION: The patient is missing tooth numbers 1, 16, 17, 19, 24, 31, and 32. There are retained roots in the area of tooth numbers 6, 7, 8, 10, 11, 14, 15, 18, 20, 29, and 30. PERIODONTAL: The patient has chronic periodontitis with plaque and calculus accumulations, generalized gingival recession, and generalized tooth mobility as per dental charting form. DENTAL CARIES/SUBOPTIMAL RESTORATIONS: Patient has rampant dental caries affecting the remaining dentition as per dental charting form. ENDODONTIC: Patient currently denies acute pulpitis symptoms. The patient does have periapical pathology associated with tooth numbers 2, 7, 15, CROWN AND BRIDGE: There are no crown or bridge restorations noted. PROSTHODONTIC:  Patient denies having partial dentures. OCCLUSION: Patient has a poor occlusal scheme secondary to multiple missing teeth, multiple retained root segments, supra-eruption and drifting of the unopposed teeth into the edentulous areas, and lack replacement of missing teeth with dental prostheses.  RADIOGRAPHIC INTERPRETATION: Orthopantogram was taken and supplemented with a full series of dental radiographs. There are multiple missing teeth. There are multiple retained root segments. There are multiple areas of periapical pathology and radiolucency. There is moderate to severe bone loss. There is supra-eruption and drifting of the unopposed teeth into the edentulous areas. There are multiple dental caries noted.  ASSESSMENTS: 1. Carcinoid tumor of the left long 2. Pre-lung surgery dental protocol examination  3. Chronic apical periodontitis 4. Rampant dental caries 5. Multiple retained root segments 6. Chronic periodontitis with bone loss 7. Gingival recession 8. Tooth mobility 9. Accretions 10. Multiple missing teeth 11. Supra-eruption and drifting of the unopposed teeth into the edentulous areas 12. Malocclusion   PLAN/RECOMMENDATIONS: 1. I discussed the risks, benefits, and complications of various treatment options with the patient in relationship to his medical and dental conditions, anticipated lung surgery, and risk for infection. We discussed various treatment options to  include no treatment, multiple extractions with alveoloplasty, pre-prosthetic surgery as indicated, periodontal therapy, dental restorations, root canal therapy, crown and bridge therapy, implant therapy, and replacement of missing teeth as indicated. The patient currently wishes to proceed with extraction of remaining teeth with alveoloplasty and pre-prosthetic surgery as needed in the operating room with general anesthesia. This has been scheduled for 01/29/2015 at 8:30 AM at [facility]. The patient will then follow-up  with the dentist of his choice for fabrication of upper lower complete dentures after adequate healing and once medically stable from the anticipated lung surgery with Dr. Servando Snare. Patient will proceed with lung surgery with Dr. Servando Snare per his discretion after the dental extractions. The patient is aware of the potential complications for bleeding, bruising, swelling, infection, nerve damage, soft tissue damage, mandible fracture, sinus involvement, and complications of general anesthesia. Patient is aware of the other potential complications not necessarily mentioned above.  2. Discussion of findings with medical team and coordination of future medical and dental care as needed.  I spent in excess of  120 minutes during the conduct of this consultation and >50% of this time involved direct face-to-face encounter for counseling and/or coordination of the patient's care.    Lenn Cal, DDS

## 2015-01-22 NOTE — Patient Instructions (Signed)
Patient is scheduled for surgery next Wednesday, 01/29/2015 8:30 AM at [facility]. Patient to call if he has problems before then. Patient to follow the dentist of his choice for fabrication of upper and lower complete dentures after adequate healing and once medically stable from anticipated lung surgery. Dr. Enrique Sack

## 2015-01-28 MED ORDER — CEFAZOLIN SODIUM-DEXTROSE 2-3 GM-% IV SOLR
2.0000 g | INTRAVENOUS | Status: AC
  Administered 2015-01-29: 2 g via INTRAVENOUS
  Filled 2015-01-28: qty 50

## 2015-01-29 ENCOUNTER — Ambulatory Visit (HOSPITAL_COMMUNITY)
Admission: RE | Admit: 2015-01-29 | Discharge: 2015-01-29 | Disposition: A | Discharge status: Home / Self Care | Payer: No Typology Code available for payment source | Source: Ambulatory Visit | Attending: Dentistry | Admitting: Dentistry

## 2015-01-29 ENCOUNTER — Ambulatory Visit (HOSPITAL_COMMUNITY): Payer: No Typology Code available for payment source | Admitting: Anesthesiology

## 2015-01-29 ENCOUNTER — Encounter (HOSPITAL_COMMUNITY)
Admission: RE | Disposition: A | Discharge status: Home / Self Care | Payer: Self-pay | Source: Ambulatory Visit | Attending: Dentistry

## 2015-01-29 DIAGNOSIS — K083 Retained dental root: Secondary | ICD-10-CM | POA: Insufficient documentation

## 2015-01-29 DIAGNOSIS — Z7951 Long term (current) use of inhaled steroids: Secondary | ICD-10-CM | POA: Insufficient documentation

## 2015-01-29 DIAGNOSIS — K029 Dental caries, unspecified: Secondary | ICD-10-CM | POA: Diagnosis not present

## 2015-01-29 DIAGNOSIS — I4510 Unspecified right bundle-branch block: Secondary | ICD-10-CM | POA: Insufficient documentation

## 2015-01-29 DIAGNOSIS — Z87891 Personal history of nicotine dependence: Secondary | ICD-10-CM | POA: Insufficient documentation

## 2015-01-29 DIAGNOSIS — C7A090 Malignant carcinoid tumor of the bronchus and lung: Secondary | ICD-10-CM | POA: Diagnosis present

## 2015-01-29 DIAGNOSIS — K088 Other specified disorders of teeth and supporting structures: Secondary | ICD-10-CM | POA: Diagnosis not present

## 2015-01-29 DIAGNOSIS — K045 Chronic apical periodontitis: Secondary | ICD-10-CM | POA: Insufficient documentation

## 2015-01-29 DIAGNOSIS — R9431 Abnormal electrocardiogram [ECG] [EKG]: Secondary | ICD-10-CM | POA: Insufficient documentation

## 2015-01-29 DIAGNOSIS — G4730 Sleep apnea, unspecified: Secondary | ICD-10-CM | POA: Insufficient documentation

## 2015-01-29 DIAGNOSIS — K0530 Chronic periodontitis, unspecified: Secondary | ICD-10-CM

## 2015-01-29 DIAGNOSIS — Z79899 Other long term (current) drug therapy: Secondary | ICD-10-CM | POA: Insufficient documentation

## 2015-01-29 DIAGNOSIS — D1430 Benign neoplasm of unspecified bronchus and lung: Secondary | ICD-10-CM

## 2015-01-29 DIAGNOSIS — M278 Other specified diseases of jaws: Secondary | ICD-10-CM | POA: Insufficient documentation

## 2015-01-29 DIAGNOSIS — J449 Chronic obstructive pulmonary disease, unspecified: Secondary | ICD-10-CM | POA: Insufficient documentation

## 2015-01-29 DIAGNOSIS — K0889 Other specified disorders of teeth and supporting structures: Secondary | ICD-10-CM

## 2015-01-29 HISTORY — PX: MULTIPLE EXTRACTIONS WITH ALVEOLOPLASTY: SHX5342

## 2015-01-29 LAB — POCT I-STAT, CHEM 8
BUN: 15 mg/dL (ref 6–20)
CALCIUM ION: 1.23 mmol/L (ref 1.12–1.23)
Chloride: 103 mmol/L (ref 101–111)
Creatinine, Ser: 0.9 mg/dL (ref 0.61–1.24)
GLUCOSE: 131 mg/dL — AB (ref 65–99)
HCT: 49 % (ref 39.0–52.0)
Hemoglobin: 16.7 g/dL (ref 13.0–17.0)
Potassium: 4.3 mmol/L (ref 3.5–5.1)
SODIUM: 140 mmol/L (ref 135–145)
TCO2: 23 mmol/L (ref 0–100)

## 2015-01-29 LAB — PLATELET COUNT: Platelets: 186 10*3/uL (ref 150–400)

## 2015-01-29 SURGERY — MULTIPLE EXTRACTION WITH ALVEOLOPLASTY
Anesthesia: General | Site: Mouth

## 2015-01-29 MED ORDER — MIDAZOLAM HCL 2 MG/2ML IJ SOLN
INTRAMUSCULAR | Status: AC
  Filled 2015-01-29: qty 2

## 2015-01-29 MED ORDER — HYDROMORPHONE HCL 1 MG/ML IJ SOLN
INTRAMUSCULAR | Status: AC
  Filled 2015-01-29: qty 1

## 2015-01-29 MED ORDER — FENTANYL CITRATE (PF) 250 MCG/5ML IJ SOLN
INTRAMUSCULAR | Status: AC
  Filled 2015-01-29: qty 5

## 2015-01-29 MED ORDER — BUPIVACAINE-EPINEPHRINE 0.5% -1:200000 IJ SOLN
INTRAMUSCULAR | Status: DC | PRN
  Administered 2015-01-29: 3.6 mL

## 2015-01-29 MED ORDER — ONDANSETRON HCL 4 MG/2ML IJ SOLN
INTRAMUSCULAR | Status: AC
  Filled 2015-01-29: qty 2

## 2015-01-29 MED ORDER — NEOSTIGMINE METHYLSULFATE 10 MG/10ML IV SOLN
INTRAVENOUS | Status: AC
  Filled 2015-01-29: qty 1

## 2015-01-29 MED ORDER — OXYMETAZOLINE HCL 0.05 % NA SOLN
NASAL | Status: DC | PRN
  Administered 2015-01-29: 1 via TOPICAL

## 2015-01-29 MED ORDER — PROMETHAZINE HCL 25 MG/ML IJ SOLN: 6.2500 mg | INTRAMUSCULAR | Status: DC | PRN

## 2015-01-29 MED ORDER — LIDOCAINE HCL (CARDIAC) 20 MG/ML IV SOLN
INTRAVENOUS | Status: AC
  Filled 2015-01-29: qty 5

## 2015-01-29 MED ORDER — MIDAZOLAM HCL 5 MG/5ML IJ SOLN
INTRAMUSCULAR | Status: DC | PRN
Start: 2015-01-29 — End: 2015-01-29
  Administered 2015-01-29: 2 mg via INTRAVENOUS

## 2015-01-29 MED ORDER — GLYCOPYRROLATE 0.2 MG/ML IJ SOLN
INTRAMUSCULAR | Status: DC | PRN
  Administered 2015-01-29: 0.4 mg via INTRAVENOUS

## 2015-01-29 MED ORDER — ROCURONIUM BROMIDE 50 MG/5ML IV SOLN
INTRAVENOUS | Status: AC
  Filled 2015-01-29: qty 1

## 2015-01-29 MED ORDER — PROPOFOL 10 MG/ML IV BOLUS
INTRAVENOUS | Status: DC | PRN
  Administered 2015-01-29: 200 mg via INTRAVENOUS

## 2015-01-29 MED ORDER — HYDROMORPHONE HCL 1 MG/ML IJ SOLN
0.2500 mg | INTRAMUSCULAR | Status: DC | PRN
  Administered 2015-01-29 (×2): 0.5 mg via INTRAVENOUS

## 2015-01-29 MED ORDER — PROPOFOL 10 MG/ML IV BOLUS
INTRAVENOUS | Status: AC
  Filled 2015-01-29: qty 20

## 2015-01-29 MED ORDER — BUPIVACAINE-EPINEPHRINE (PF) 0.5% -1:200000 IJ SOLN
INTRAMUSCULAR | Status: AC
  Filled 2015-01-29: qty 3.6

## 2015-01-29 MED ORDER — NEOSTIGMINE METHYLSULFATE 10 MG/10ML IV SOLN
INTRAVENOUS | Status: DC | PRN
  Administered 2015-01-29: 3 mg via INTRAVENOUS

## 2015-01-29 MED ORDER — SODIUM CHLORIDE 0.9 % IJ SOLN
INTRAMUSCULAR | Status: AC
  Filled 2015-01-29: qty 10

## 2015-01-29 MED ORDER — FENTANYL CITRATE (PF) 100 MCG/2ML IJ SOLN
INTRAMUSCULAR | Status: DC | PRN
  Administered 2015-01-29: 50 ug via INTRAVENOUS
  Administered 2015-01-29: 100 ug via INTRAVENOUS
  Administered 2015-01-29 (×2): 50 ug via INTRAVENOUS

## 2015-01-29 MED ORDER — LACTATED RINGERS IV SOLN
INTRAVENOUS | Status: DC | PRN
  Administered 2015-01-29: 08:00:00 via INTRAVENOUS

## 2015-01-29 MED ORDER — OXYMETAZOLINE HCL 0.05 % NA SOLN
NASAL | Status: AC
  Filled 2015-01-29: qty 15

## 2015-01-29 MED ORDER — OXYCODONE-ACETAMINOPHEN 5-325 MG PO TABS: ORAL_TABLET | ORAL | Status: DC

## 2015-01-29 MED ORDER — SUCCINYLCHOLINE CHLORIDE 20 MG/ML IJ SOLN
INTRAMUSCULAR | Status: AC
  Filled 2015-01-29: qty 1

## 2015-01-29 MED ORDER — EPHEDRINE SULFATE 50 MG/ML IJ SOLN
INTRAMUSCULAR | Status: AC
  Filled 2015-01-29: qty 1

## 2015-01-29 MED ORDER — ONDANSETRON HCL 4 MG/2ML IJ SOLN
INTRAMUSCULAR | Status: DC | PRN
  Administered 2015-01-29: 4 mg via INTRAVENOUS

## 2015-01-29 MED ORDER — ROCURONIUM BROMIDE 100 MG/10ML IV SOLN
INTRAVENOUS | Status: DC | PRN
  Administered 2015-01-29: 40 mg via INTRAVENOUS

## 2015-01-29 MED ORDER — LIDOCAINE-EPINEPHRINE 2 %-1:100000 IJ SOLN
INTRAMUSCULAR | Status: DC | PRN
  Administered 2015-01-29: 10.8 mL

## 2015-01-29 MED ORDER — MEPERIDINE HCL 25 MG/ML IJ SOLN: 6.2500 mg | INTRAMUSCULAR | Status: DC | PRN

## 2015-01-29 MED ORDER — 0.9 % SODIUM CHLORIDE (POUR BTL) OPTIME
TOPICAL | Status: DC | PRN
  Administered 2015-01-29: 1000 mL

## 2015-01-29 MED ORDER — GLYCOPYRROLATE 0.2 MG/ML IJ SOLN
INTRAMUSCULAR | Status: AC
  Filled 2015-01-29: qty 2

## 2015-01-29 MED ORDER — LIDOCAINE-EPINEPHRINE 2 %-1:100000 IJ SOLN
INTRAMUSCULAR | Status: AC
  Filled 2015-01-29: qty 10.2

## 2015-01-29 MED ORDER — LIDOCAINE HCL (CARDIAC) 20 MG/ML IV SOLN
INTRAVENOUS | Status: DC | PRN
  Administered 2015-01-29: 60 mg via INTRAVENOUS

## 2015-01-29 SURGICAL SUPPLY — 33 items
ALCOHOL 70% 16 OZ (MISCELLANEOUS) ×2 IMPLANT
ATTRACTOMAT 16X20 MAGNETIC DRP (DRAPES) ×2 IMPLANT
BLADE SURG 15 STRL LF DISP TIS (BLADE) ×2 IMPLANT
BLADE SURG 15 STRL SS (BLADE) ×2
COVER SURGICAL LIGHT HANDLE (MISCELLANEOUS) ×2 IMPLANT
GAUZE PACKING FOLDED 2  STR (GAUZE/BANDAGES/DRESSINGS) ×1
GAUZE PACKING FOLDED 2 STR (GAUZE/BANDAGES/DRESSINGS) ×1 IMPLANT
GAUZE SPONGE 4X4 16PLY XRAY LF (GAUZE/BANDAGES/DRESSINGS) ×4 IMPLANT
GLOVE BIOGEL PI IND STRL 6 (GLOVE) ×1 IMPLANT
GLOVE BIOGEL PI INDICATOR 6 (GLOVE) ×1
GLOVE SURG ORTHO 8.0 STRL STRW (GLOVE) ×2 IMPLANT
GLOVE SURG SS PI 6.0 STRL IVOR (GLOVE) ×2 IMPLANT
GOWN STRL REUS W/ TWL LRG LVL3 (GOWN DISPOSABLE) ×1 IMPLANT
GOWN STRL REUS W/TWL 2XL LVL3 (GOWN DISPOSABLE) ×2 IMPLANT
GOWN STRL REUS W/TWL LRG LVL3 (GOWN DISPOSABLE) ×1
HEMOSTAT SURGICEL 2X14 (HEMOSTASIS) ×2 IMPLANT
KIT BASIN OR (CUSTOM PROCEDURE TRAY) ×2 IMPLANT
KIT ROOM TURNOVER OR (KITS) ×2 IMPLANT
MANIFOLD NEPTUNE WASTE (CANNULA) ×2 IMPLANT
NEEDLE BLUNT 16X1.5 OR ONLY (NEEDLE) ×2 IMPLANT
NS IRRIG 1000ML POUR BTL (IV SOLUTION) ×2 IMPLANT
PACK EENT II TURBAN DRAPE (CUSTOM PROCEDURE TRAY) ×2 IMPLANT
PAD ARMBOARD 7.5X6 YLW CONV (MISCELLANEOUS) ×4 IMPLANT
SPONGE GAUZE 4X4 12PLY STER LF (GAUZE/BANDAGES/DRESSINGS) ×2 IMPLANT
SPONGE SURGIFOAM ABS GEL 100 (HEMOSTASIS) ×2 IMPLANT
SPONGE SURGIFOAM ABS GEL 12-7 (HEMOSTASIS) IMPLANT
SPONGE SURGIFOAM ABS GEL SZ50 (HEMOSTASIS) IMPLANT
SUCTION FRAZIER TIP 10 FR DISP (SUCTIONS) ×2 IMPLANT
SUT CHROMIC 3 0 PS 2 (SUTURE) ×8 IMPLANT
SYR 50ML SLIP (SYRINGE) ×2 IMPLANT
TOWEL OR 17X26 10 PK STRL BLUE (TOWEL DISPOSABLE) ×2 IMPLANT
TUBE CONNECTING 12X1/4 (SUCTIONS) ×2 IMPLANT
YANKAUER SUCT BULB TIP NO VENT (SUCTIONS) ×2 IMPLANT

## 2015-01-29 NOTE — Op Note (Signed)
OPERATIVE REPORT  Patient:            Timothy Benitez Date of Birth:  [birth year] MRN:                [id number]   DATE OF PROCEDURE:  01/29/2015  PREOPERATIVE DIAGNOSES: 1. Carcinoid tumor of the lung 2. Pre-lung surgery dental protocol 3. Chronic apical periodontitis 4. Chronic periodontitis 5. Dental caries 6. Multiple retained root segments 7. Loose teeth  POSTOPERATIVE DIAGNOSES: 1. Carcinoid tumor of the lung 2. Pre-lung surgery dental protocol 3. Chronic apical periodontitis 4. Chronic periodontitis 5. Dental caries 6. Multiple retained root segments 7. Loose teeth  OPERATIONS: 1. Multiple extraction of tooth numbers 2, 3, 4, 5, 6, 7, 8, 9, 10, 11, 12, 13, 14, 15, 18, 20, 21, 22, 23, 25, 26, 27, 28, 29, and 30 2. 4 Quadrants of alveoloplasty 3. Mandibular right lateral exostoses reductions   SURGEON: Lenn Cal, DDS  ASSISTANT: Camie Patience, (dental assistant)  ANESTHESIA: General anesthesia via nasoendotracheal tube.  MEDICATIONS: 1. Ancef 2 g IV prior to invasive dental procedures. 2. Local anesthesia with a total utilization of 6 carpules each containing 34 mg of lidocaine with 0.017 mg of epinephrine as well as 2 carpules each containing 9 mg of bupivacaine with 0.009 mg of epinephrine.  SPECIMENS: There are 25 teeth that were discarded.  DRAINS: None  CULTURES: None  COMPLICATIONS: None   ESTIMATED BLOOD LOSS: 150 mLs.  INTRAVENOUS FLUIDS: 800 mLs of Lactated ringers solution.  INDICATIONS: The patient was recently diagnosed with carcinoid tumor of the lung.  A medically necessary dental consultation was then requested to evaluate poor dentition and to rule out etiology for aspiration of teeth and risk for dental infection.  The patient was examined and treatment planned for extraction of all remaining teeth with alveoloplasty and pre-prosthetic surgery as indicated in the operating room with general anesthesia.  This treatment plan was  formulated to decrease the risks and complications associated with dental infection from affecting the patient's systemic health and the anticipated lung surgery.  OPERATIVE FINDINGS: Patient was examined operating room number 11.  The teeth were identified for extraction. The patient was noted be affected by chronic periodontitis, chronic apical periodontitis, multiple retained root segments, dental caries, and loose teeth.   DESCRIPTION OF PROCEDURE: Patient was brought to the main operating room number 11. Patient was then placed in the supine position on the operating table. General anesthesia was then induced per the anesthesia team. The patient was then prepped and draped in the usual manner for dental medicine procedure. A timeout was performed. The patient was identified and procedures were verified. A throat pack was placed at this time. The oral cavity was then thoroughly examined with the findings noted above. The patient was then ready for dental medicine procedure as follows:  Local anesthesia was then administered sequentially with a total utilization of 6 carpules each containing 34 mg of lidocaine with 0.017 mg of epinephrine as well as 2 carpules  each containing 9 mg bupivacaine with 0.009 mg of epinephrine.  The Maxillary left and right quadrants first approached. Anesthesia was then delivered utilizing infiltration with lidocaine with epinephrine. A #15 blade incision was then made from the maxillary right tuberosity and extended to the maxillary left tuberosity.  A  surgical flap was then carefully reflected. Appropriate amounts of buccal and interseptal bone were then removed utilizing a surgical handpiece and bur and copious amounts of sterile water.  The maxillary  teeth were then subluxated with a series of straight elevators. Tooth numbers 2, 3, 4, 5, 6, 7, 8, 9, 10, 11, 12, 13, 14, 15, were then removed with a 150 forceps without complications. Alveoloplasty was then performed  utilizing a ronguers and bone file. Excessive granulation tissue was removed from the area of #7 as needed. The surgical site was then irrigated with copious amounts of sterile saline. The tissues were approximated and trimmed appropriately. A piece of Surgifoam was then placed in the extraction sockets as needed. The surgical site was then closed from the maxillary right tuberosity and extended the mesial #8 utilizing 3-0 chromic gut suture in a continuous interrupted suture technique 1. The maxillary left surgical site was then closed from the maxillary left tuberosity and extended the mesial #9 utilizing 3-0 chromic gut suture in a continuous interrupted suture technique 1.  At this point time, the mandibular quadrants were approached. The patient was given bilateral inferior alveolar nerve blocks and long buccal nerve blocks utilizing the bupivacaine with epinephrine. Further infiltration was then achieved utilizing the lidocaine with epinephrine. A 15 blade incision was then made from the distal of number of #17 and extended to the distal of #32.  A surgical flap was then carefully reflected. The lower teeth were then subluxated with a series of straight elevators.  Tooth numbers 18, 20, 21, 22, 23, 25, 26, 27, 28, 29, and 30 were then removed with a 151 forceps and rongeurs as indicated.  Alveoloplasty was then performed utilizing a rongeurs and bone file. The mandibular right quadrant then had the lingual flap further reflected to expose the lateral exostoses. The exostoses were then reduced utilizing a surgical handpiece and bur and copious amounts sterile saline. Further alveoloplasty was then performed utilizing a rongeur and bone file. The tissues were approximated and trimmed appropriately. The surgical sites were then irrigated with copious amounts of sterile saline. A piece of Surgifoam was then placed in the extraction socket as needed. The mandibular left surgical site was then closed from the  distal of  17 and extended to the mesial of #24.  The mandibular right surgical site was then closed from the distal of 32 and extended to the mesial of #25 utilizing 3-0 chromic gut suture in a continuous interrupted suture technique x1.   At this point time, the entire mouth was irrigated with copious amounts of sterile saline. The patient was examined for complications, seeing none, the dental medicine procedure was deemed to be complete. The throat pack was removed at this time. An oral airway was then placed at the request of the anesthesia team. A series of 4 x 4 gauze were placed in the mouth to aid hemostasis. The patient was then handed over to the anesthesia team for final disposition. After an appropriate amount of time, the patient was extubated and taken to the postanesthsia care unit condition. All counts were correct for the dental medicine procedure. The patient was then given a prescription for Percocet 5/325. Patient is to use one to 2 tablets every 4-6 hours as needed for pain. Patient is to return to dental medicine in 7-10 days for evaluation for suture removal. Patient will be seen by Dr. Servando Snare next week and scheduled for his lung surgery as indicated at that time.   Lenn Cal, DDS.

## 2015-01-29 NOTE — Anesthesia Postprocedure Evaluation (Signed)
Anesthesia Post Note  Patient: Timothy Benitez  Procedure(s) Performed: Procedure(s) (LRB): Extraction of tooth #'s 2,3,4,5,6,7,8,9,10,11,12,13,14,15,18,20,21,22,23, 25,26,27,28,29,30 with alveoloplasty and mandibular right lateral exostoses reductions (N/A)  Anesthesia type: General  Patient location: PACU  Post pain: Pain level controlled  Post assessment: Post-op Vital signs reviewed  Last Vitals: BP 141/67 mmHg  Pulse 54  Temp(Src) 36.8 C  Resp 12  SpO2 98%  Post vital signs: Reviewed  Level of consciousness: sedated  Complications: No apparent anesthesia complications

## 2015-01-29 NOTE — Transfer of Care (Signed)
Immediate Anesthesia Transfer of Care Note  Patient: Timothy Benitez  Procedure(s) Performed: Procedure(s): Extraction of tooth #'s 2,3,4,5,6,7,8,9,10,11,12,13,14,15,18,20,21,22,23, 25,26,27,28,29,30 with alveoloplasty and mandibular right lateral exostoses reductions (N/A)  Patient Location: PACU  Anesthesia Type:General  Level of Consciousness: awake, alert  and oriented  Airway & Oxygen Therapy: Patient Spontanous Breathing and Patient connected to face mask oxygen  Post-op Assessment: Report given to RN, Post -op Vital signs reviewed and stable and Patient moving all extremities X 4  Post vital signs: Reviewed and stable  Last Vitals:  Filed Vitals:   01/29/15 0818  BP: 139/72  Pulse: 67  Temp: 36.7 C  Resp: 20    Complications: No apparent anesthesia complications

## 2015-01-29 NOTE — H&P (Signed)
01/29/2015   Patient Name:   Timothy Benitez Date of Birth:   [birth year] Medical Record Number: [id number]   [name] is a 54 year old male with carcinoid tumor of the lung. Patient presents for extraction of remaining teeth with alveoloplasty and pre-prosthetic surgery as indicated in the operating with general anesthesia as part of a pre-lung surgery dental protocol.  Patient denies any acute medical or dental changes. Please use note from Dr. Servando Snare for use as the H&P with operating room procedure today.  Lenn Cal, DDS  Progress Notes    Expand All Collapse All       [address]  [place] [address]  [phone]   [place] F Pritchard [employer] Medical Record #[id number] Date of Birth: 12/17/1960  Referring: Nestor Lewandowsky, MD Primary Care: [name], NP  Chief Complaint:  Chief Complaint  Patient presents with  . Lung Mass    Surgical evel, PET Scan 01/13/18, Chest CT 12/20/14, PFT's 12/30/14    History of Present Illness:  Timothy Benitez 54 y.o. male is seen in the office today for recent diagnosis of carcinoid of the left upper lobe. He was seen by primary care for evaluation of a cough. cause A chest x-ray was obtained. The chest x-ray was abnormal which led to a CT scan. The CT scan showed a left upper lobe mass with atelectasis of the left upper lobe. The patient is sent here for further evaluation. There are no associated symptoms. He denied any significant hemoptysis weight loss fever or chills. He denies any chest pain. The cough has now abated. He does have a history of smoking 1-2 packs cigarettes a day. He quit smoking a year ago but currently smokes cigars. 1-2 alcoholic drinks per week. 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 Recent bronchoscopy done  From  Bronch: No images in EPIC :The Bronchoscope was inserted in to endotracheal tube. The Airway was inspected(Rt lung and Left Lung). There Was a large Endobronchial Tumor/mass located at proximal orifice of Left upper Lobe with t 100% occlusion of airway. The lesion was friable and bleeds easily. Airway exam proceeded with findings, technical procedures, and specimen collection as noted below. At the end of exam the scope was withdrawn without incident. Impression and Plan as noted below.       From [facility]:  A. LUNG, LEFT UPPER LOBE; BRONCHOSCOPY WITH BIOPSY:  - CARCINOID TUMOR.   Comment:  The neoplastic cells demonstrate diffuse and strong membranous staining  with CD 56. Ki-67 labels less than 5% of neoplastic cells. Less than 2  mitoses identified in 2 mm area. Necrosis is not identified.   Current Activity/ Functional Status:  Patient is independent with mobility/ambulation, transfers, ADL's, IADL's.   Zubrod Score: At the time of surgery this patient's most appropriate activity status/level should be described as: [ ]  0 Normal activity, no symptoms [ ]  1 Restricted in physical strenuous activity but ambulatory, able to do out light work [ ]  2 Ambulatory and capable of self care, unable to do work activities, up and about  >50 % of waking hours  [ ]  3 Only limited self care, in bed greater than 50% of waking hours [ ]  4 Completely disabled, no self care, confined to bed or chair [ ]  5 Moribund   Past Medical History  Diagnosis Date  . COPD (chronic obstructive pulmonary  disease)   . Heart murmur   . Sleep apnea     "mild" never needed CPAP  . Carcinoid tumor of lung   . Cancer     Lung Cancer    Past Surgical History  Procedure Laterality Date  . Tonsilectomy 1969    . Wisdom teeth extraction 1989    . Vasectomy       Family History  Problem Relation Age of Onset  . Leukemia Father       History  Smoking status  . Former Smoker -- 1.50 packs/day for 34 years  . Types: Cigarettes  . Quit date: 09/27/2013  Smokeless tobacco  . Never Used    History  Alcohol Use  . 0.6 oz/week  . 1 Cans of beer per week    Comment: 2-3 every couple weeks     No Known Allergies  Current Outpatient Prescriptions  Medication Sig Dispense Refill  . atorvastatin (LIPITOR) 20 MG tablet Take 20 mg by mouth daily.    Marland Kitchen b complex vitamins tablet Take 1 tablet by mouth daily.    . budesonide-formoterol (SYMBICORT) 160-4.5 MCG/ACT inhaler Inhale 2 puffs into the lungs 2 (two) times daily.    . vitamin A 10000 UNIT capsule Take 10,000 Units by mouth daily.     No current facility-administered medications for this visit.     Review of Systems:   Cardiac Review of Systems: Y or N Chest Pain [ N ] Resting SOB [ N ]Exertional SOB [Y ] Orthopnea Aqua.Slicker ] Pedal Edema [ N ] Palpitations [ N ]Syncope [ N ] Presyncope [N ] General Review of Systems: [Y] = yes [ ] =no Constitional: recent weight change [ N ]; Wt loss over the last 3 months [ ]  anorexia [ ] ; fatigue [ ] ; nausea [ ] ; night sweats [N ]; fever [ ] ; or chills [ ] ;  Dental: poor dentition[ VERY POOR WITH LOOSE TEETH ]; Last Dentist visit: does not know  Eye : blurred vision [N ]; diplopia [ ] ; vision changes [ ] ; Amaurosis fugax[ ] ; Resp: cough [Y ]; wheezing[N ]; hemoptysis[ N ]; shortness of breath[ N ]; paroxysmal nocturnal dyspnea[ ] ; dyspnea on exertion[ ] ; or orthopnea[ ] ;  GI: gallstones[ ] , vomiting[ ] ; dysphagia[ ] ; melena[ ] ; hematochezia [ ] ; heartburn[ ] ; Hx of Colonoscopy[ N]; GU: kidney stones [ ] ; hematuria[ ] ; dysuria [ ] ;  nocturia[ ] ; history of obstruction [ N ]; urinary frequency [N ]  Skin: rash, swelling[ ] ;, hair loss[ ] ; peripheral edema[ ] ; or itching[ ] ; Musculosketetal: myalgias[ ] ; joint swelling[ ] ; joint erythema[ ] ; joint pain[ ] ; back pain[ ] ; Heme/Lymph: bruising[ ] ; bleeding[ ] ; anemia[ ] ;  Neuro: TIA[ ] ; headaches[ ] ; stroke[ ] ; vertigo[ ] ; seizures[ N ]; paresthesias[ ] ; difficulty walking[ N ]; Psych:depression[ ] ; anxiety[ ] ; Endocrine: diabetes[N ]; thyroid dysfunction[ N ]; Immunizations: Flu up to date Aqua.Slicker ]; Pneumococcal up to date [facility] ]; Other:  Physical Exam: BP 135/72 mmHg  Pulse 68  Resp 20  Ht 5' 11"  (1.803 m)  Wt 207 lb (93.895 kg)  BMI 28.88 kg/m2  SpO2 93%  PHYSICAL EXAMINATION: General appearance: alert, cooperative, appears stated age and no distress Head: Normocephalic, without obvious abnormality, atraumatic Neck: no adenopathy, no carotid bruit, no JVD, supple, symmetrical, trachea midline and thyroid not enlarged, symmetric, no tenderness/mass/nodules Lymph nodes: Cervical, supraclavicular, and axillary nodes normal. Resp: clear to auscultation bilaterally Back: symmetric, no curvature. ROM normal. No CVA tenderness. Cardio: regular rate and rhythm, S1,  S2 normal, no murmur, click, rub or gallop, History of heart murmur, but I do not hear one GI: soft, non-tender; bowel sounds normal; no masses, no organomegaly Extremities: extremities normal, atraumatic, no cyanosis or edema Neurologic: Grossly normal palpable dp and pt pulses Almost all teeth are broken or loose, several lower teeth are very loose and would be risk of dislodging with intubation and post op respiratory care.   Diagnostic Studies & Laboratory data:   Recent Radiology Findings:   Imaging Results    Ct Chest W Contrast  12/20/2014 CLINICAL DATA: Left lung infiltrate. EXAM:  CT CHEST WITH CONTRAST TECHNIQUE: Multidetector CT imaging of the chest was performed during intravenous contrast administration. CONTRAST: 75 mL of Omnipaque 350 intravenously. COMPARISON: None. FINDINGS: No pneumothorax or pleural effusion is noted. There is no evidence of thoracic aortic dissection or aneurysm. Visualized portion of upper abdomen appears normal. No significant osseous abnormality is noted. 4.4 x 2.8 cm subcutaneous cyst is seen in right breast region. There is postobstructive atelectasis of the left upper lobe secondary to enhancing probable endobronchial mass at the origin of the left upper lobe bronchus which measures 1.8 x 1.5 x 1.2 cm. No other significant mediastinal mass or adenopathy is noted. IMPRESSION: Postobstructive atelectasis of left upper lobe secondary to 1.8 cm probable endobronchial mass at the origin of the left upper lobe bronchus. This is highly concerning for neoplasm or malignancy. Bronchoscopy is recommended for further evaluation. These results will be called to the ordering clinician or representative by the Radiologist Assistant, and communication documented in the PACS or zVision Dashboard. Electronically Signed By: Marijo Conception, M.D. On: 12/20/2014 09:56   Nm Pet Image Initial (pi) Skull Base To Thigh  01/14/2015 CLINICAL DATA: Initial treatment strategy for Lung cancer. EXAM: NUCLEAR MEDICINE PET SKULL BASE TO THIGH TECHNIQUE: 12.38 mCi F-18 FDG was injected intravenously. Full-ring PET imaging was performed from the skull base to thigh after the radiotracer. CT data was obtained and used for attenuation correction and anatomic localization. FASTING BLOOD GLUCOSE: Value: 106 mg/dl COMPARISON: 12/20/2014 FINDINGS: NECK No hypermetabolic lymph nodes in the neck. CHEST There is expected low level FDG uptake associated with the left upper lobe, biopsy proved endobronchial carcinoid. The SUV max is equal to 2.9. Associated postobstructive  pneumonitis/atelectasis involving the left upper lobe is again noted. ABDOMEN/PELVIS No abnormal hypermetabolic activity within the liver, pancreas, adrenal glands, or spleen. No hypermetabolic lymph nodes in the abdomen or pelvis.Aortic atherosclerosis noted. SKELETON There is a sclerotic lesion within the left iliac wing measuring 11 mm, image 219 of series 3. No corresponding FDG uptake noted on the PET images. Degenerative disc disease noted within the thoracic and lumbar spine. IMPRESSION: 1. Expected low level FDG uptake is associated with the left upper lobe endobronchial carcinoid. 2. Postobstructive atelectasis involves the left upper lobe. 3. No evidence for hypermetabolic metastasis. Electronically Signed By: Kerby Moors M.D. On: 01/14/2015 13:19    I have independently reviewed the above radiology studies and reviewed the findings with the patient.    Recent Lab Findings:  Recent Labs    No results found for: WBC, HGB, HCT, PLT, GLUCOSE, CHOL, TRIG, HDL, LDLDIRECT, LDLCALC, ALT, AST, NA, K, CL, CREATININE, BUN, CO2, TSH, INR, GLUF, HGBA1C    PFT's:12/30/2014 FEV1 2.16 58 % DLCO 27.9 96%  Assessment / Plan:  Carcinoid tumor of the left upper lobe, likely will require sleeve resection or poss pneumonectomy. I have discussed with the patient the need for resection , without  will result in continued obstruction of left upper lobe and potentially lower lobe. Patient has had his questions answered and is will to proceed with resection.  The patient dental condition makes him at high risk to aspirated loose broken teeth with manipulations of airway intraop and post op and st increased risk of pulmonary infection postop. I have referred him to Dr Enrique Sack to evaluate his dental situation and proceed with necessary extractions to make pulmonary resection safer. Will proceed with lung resection in the next 2 weeks depending on dental treatment     I spent 45 minutes  counseling the patient face to face and 50% or more the time was spent in counseling and coordination of care. The total time spent in the appointment was 60 minutes.  [name] MD     [address] [place] [address] Office [phone] Beeper [phone]  01/17/2015 12:57 PM

## 2015-01-29 NOTE — Anesthesia Procedure Notes (Signed)
Procedure Name: Intubation Date/Time: 01/29/2015 8:39 AM Performed by: Kyung Rudd Pre-anesthesia Checklist: Patient identified, Emergency Drugs available, Suction available, Patient being monitored and Timeout performed Patient Re-evaluated:Patient Re-evaluated prior to inductionOxygen Delivery Method: Circle system utilized Preoxygenation: Pre-oxygenation with 100% oxygen Intubation Type: IV induction Ventilation: Mask ventilation without difficulty and Oral airway inserted - appropriate to patient size Laryngoscope Size: Mac and 4 Grade View: Grade I Nasal Tubes: Right, Nasal prep performed, Magill forceps- large, utilized and Nasal Rae Tube size: 7.0 mm Number of attempts: 1 Placement Confirmation: ETT inserted through vocal cords under direct vision,  positive ETCO2 and breath sounds checked- equal and bilateral Tube secured with: Tape Dental Injury: Teeth and Oropharynx as per pre-operative assessment

## 2015-01-29 NOTE — Progress Notes (Signed)
PRE-OPERATIVE NOTE:  01/29/2015   Timothy Benitez [id number]    Timothy Benitez presents for multiple dental extractions with alveoloplasty and pre-prosthetic surgery as indicated in the operating room with general anesthesia.    SUBJECTIVE: The patient denies any acute medical or dental changes and agrees to proceed with treatment as planned.  EXAM: No sign of acute dental changes.  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   PLAN: Patient agrees to proceed with treatment as planned in the operating room as previously discussed and accepts the risks, benefits, and complications of the proposed treatment. Patient is aware of the risk for bleeding, bruising, swelling, infection, pain, nerve damage, soft tissue damage, sinus involvement, root tip fracture, mandible fracture, and the risks of complications associated with the anesthesia. Patient is aware of the potential for complications up to and including death due to his overall medical compromise and respiratory compromise. Patient also is aware of the potential for other complications not mentioned above.   Lenn Cal, DDS

## 2015-01-29 NOTE — Anesthesia Preprocedure Evaluation (Addendum)
Anesthesia Evaluation  Patient identified by MRN, date of birth, ID band Patient awake    Reviewed: Allergy & Precautions, NPO status , Patient's Chart, lab work & pertinent test results, reviewed documented beta blocker date and time   Airway Mallampati: II  TM Distance: >3 FB Neck ROM: Full    Dental  (+) Chipped, Missing, Poor Dentition   Pulmonary sleep apnea , COPDformer smoker,  - rhonchi  + decreased breath sounds      Cardiovascular + Valvular Problems/Murmurs Rhythm:Regular     Neuro/Psych negative neurological ROS  negative psych ROS   GI/Hepatic negative GI ROS, Neg liver ROS,   Endo/Other  negative endocrine ROS  Renal/GU negative Renal ROS     Musculoskeletal negative musculoskeletal ROS (+)   Abdominal   Peds  Hematology negative hematology ROS (+)   Anesthesia Other Findings Very poor teeth  Reproductive/Obstetrics negative OB ROS                           Anesthesia Physical  Anesthesia Plan  ASA: III  Anesthesia Plan: General   Post-op Pain Management:    Induction: Intravenous  Airway Management Planned: Oral ETT  Additional Equipment:   Intra-op Plan:   Post-operative Plan: Extubation in OR  Informed Consent: I have reviewed the patients History and Physical, chart, labs and discussed the procedure including the risks, benefits and alternatives for the proposed anesthesia with the patient or authorized representative who has indicated his/her understanding and acceptance.   Dental advisory given  Plan Discussed with: CRNA  Anesthesia Plan Comments:         Anesthesia Quick Evaluation

## 2015-01-29 NOTE — Discharge Instructions (Signed)

## 2015-01-30 ENCOUNTER — Encounter (HOSPITAL_COMMUNITY): Payer: Self-pay | Admitting: Dentistry

## 2015-02-10 ENCOUNTER — Encounter (HOSPITAL_COMMUNITY): Payer: Self-pay | Admitting: Dentistry

## 2015-02-10 ENCOUNTER — Ambulatory Visit (INDEPENDENT_AMBULATORY_CARE_PROVIDER_SITE_OTHER): Payer: No Typology Code available for payment source | Admitting: Cardiothoracic Surgery

## 2015-02-10 ENCOUNTER — Ambulatory Visit (HOSPITAL_COMMUNITY): Payer: Self-pay | Admitting: Dentistry

## 2015-02-10 ENCOUNTER — Encounter: Payer: Self-pay | Admitting: Cardiothoracic Surgery

## 2015-02-10 ENCOUNTER — Other Ambulatory Visit: Payer: Self-pay | Admitting: *Deleted

## 2015-02-10 VITALS — BP 130/62 | HR 64 | Temp 98.1°F

## 2015-02-10 VITALS — BP 136/70 | HR 65 | Resp 20 | Ht 71.0 in | Wt 205.0 lb

## 2015-02-10 DIAGNOSIS — R918 Other nonspecific abnormal finding of lung field: Secondary | ICD-10-CM

## 2015-02-10 DIAGNOSIS — J984 Other disorders of lung: Secondary | ICD-10-CM

## 2015-02-10 DIAGNOSIS — K08109 Complete loss of teeth, unspecified cause, unspecified class: Secondary | ICD-10-CM

## 2015-02-10 DIAGNOSIS — T148XXD Other injury of unspecified body region, subsequent encounter: Secondary | ICD-10-CM

## 2015-02-10 DIAGNOSIS — D1430 Benign neoplasm of unspecified bronchus and lung: Secondary | ICD-10-CM

## 2015-02-10 DIAGNOSIS — Z01818 Encounter for other preprocedural examination: Secondary | ICD-10-CM

## 2015-02-10 DIAGNOSIS — K0820 Unspecified atrophy of edentulous alveolar ridge: Secondary | ICD-10-CM

## 2015-02-10 MED ORDER — CHLORHEXIDINE GLUCONATE 0.12 % MT SOLN: OROMUCOSAL | Status: DC

## 2015-02-10 NOTE — Progress Notes (Signed)
[address]       [place] [address]             [phone]                    [name] [facility] Medical Record #[id number] Date of Birth: 10/27/1960  Referring: [name], NP Primary Care: [name], NP  Chief Complaint:    Chief Complaint  Patient presents with  . Lung Mass    Further discuss surgery    History of Present Illness:    Timothy Benitez 54 y.o. male is seen in the office last week for recent diagnosis of carcinoid  of the left upper lobe. He was seen by primary care  for evaluation of a cough.  cause  A chest x-ray was obtained. The chest x-ray was abnormal which led to a CT scan. The CT scan showed a left upper lobe mass with atelectasis of the left upper lobe. The patient is sent here for further evaluation.  There are no associated symptoms. He denied any significant hemoptysis weight loss fever or chills. He denies any chest pain. The cough has now abated. He does have a history of smoking 1-2 packs cigarettes a day. He quit smoking  a year ago but currently smokes cigars.  1-2 alcoholic drinks per week. 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 Recent bronchoscopy done   From Bronch:  No images in EPIC :The Bronchoscope was inserted in to endotracheal tube. The Airway was inspected(Rt lung and Left Lung). There Was a large Endobronchial Tumor/mass located at proximal orifice of Left upper Lobe with t 100% occlusion of airway. The lesion was friable and bleeds easily. Airway exam proceeded with findings, technical procedures, and specimen collection as noted below. At the end of exam the scope was withdrawn without incident. Impression and Plan as noted below.       From [facility] at [facility]:  A. LUNG, LEFT UPPER LOBE; BRONCHOSCOPY WITH BIOPSY:  - CARCINOID TUMOR.   Comment:  The neoplastic cells demonstrate diffuse and strong  membranous staining  with CD 56. Ki-67 labels less than 5% of neoplastic cells. Less than 2  mitoses identified in 2 mm area. Necrosis is not identified.   Patient returns today after dental extraction. Dr Enrique Sack reports gums healing well.   Current Activity/ Functional Status:  Patient is independent with mobility/ambulation, transfers, ADL's, IADL's.   Zubrod Score: At the time of surgery this patient's most appropriate activity status/level should be described as: [x]     0    Normal activity, no symptoms []     1    Restricted in physical strenuous activity but ambulatory, able to do out light work []     2    Ambulatory and capable of self care, unable to do work activities, up and about               >50 % of waking hours                              []     3    Only limited self care, in bed greater than 50% of waking hours []     4    Completely disabled, no self care, confined to bed or chair []   5    Moribund   Past Medical History  Diagnosis Date  . COPD (chronic obstructive pulmonary disease)   . Heart murmur   . Sleep apnea     "mild" never needed CPAP  . Carcinoid tumor of lung   . Cancer     Lung Cancer    Past Surgical History  Procedure Laterality Date  . Tonsilectomy 1969    . Wisdom teeth extraction 1989    . Vasectomy    . Multiple extractions with alveoloplasty N/A 01/29/2015    Procedure: Extraction of tooth #'s 2,3,4,5,6,7,8,9,10,11,12,13,14,15,18,20,21,22,23, 25,26,27,28,29,30 with alveoloplasty and mandibular right lateral exostoses reductions;  Surgeon: Lenn Cal, DDS;  Location: [facility];  Service: Oral Surgery;  Laterality: N/A;    Family History  Problem Relation Age of Onset  . Leukemia Father       History  Smoking status  . Former Smoker -- 1.50 packs/day for 34 years  . Types: Cigarettes  . Quit date: 09/27/2013  Smokeless tobacco  . Never Used    History  Alcohol Use  . 0.6 oz/week  . 1 Cans of beer per week    Comment:  2-3 beers every couple weeks     No Known Allergies  Current Outpatient Prescriptions  Medication Sig Dispense Refill  . albuterol (PROVENTIL HFA;VENTOLIN HFA) 108 (90 BASE) MCG/ACT inhaler Inhale 1-2 puffs into the lungs every 6 (six) hours as needed for wheezing or shortness of breath.    Marland Kitchen atorvastatin (LIPITOR) 20 MG tablet Take 20 mg by mouth daily.    Marland Kitchen b complex vitamins tablet Take 1 tablet by mouth daily.    . budesonide-formoterol (SYMBICORT) 160-4.5 MCG/ACT inhaler Inhale 2 puffs into the lungs 2 (two) times daily.    . chlorhexidine (PERIDEX) 0.12 % solution Rinse with 15 mls twice daily for 30 seconds. Use after breakfast and at bedtime. Spit out excess. Do not swallow. 480 mL 6  . Cholecalciferol (VITAMIN D) 2000 UNITS CAPS Take 1 capsule by mouth daily.    Marland Kitchen oxyCODONE-acetaminophen (PERCOCET) 5-325 MG per tablet Take one or two tablets by mouth every 4-6 hours as needed for pain. 40 tablet 0   No current facility-administered medications for this visit.     Review of Systems:     Cardiac Review of Systems: Y or N  Chest Pain [  N  ]  Resting SOB [ N  ] Exertional SOB  [Y  ]  Orthopnea Aqua.Slicker  ]   Pedal Edema [ N  ]    Palpitations [ N ] Syncope  [ N ]   Presyncope Aqua.Slicker   ]  General Review of Systems: [Y] = yes [  ]=no Constitional: recent weight change [ N ];  Wt loss over the last 3 months [   ] anorexia [  ]; fatigue [  ]; nausea [  ]; night sweats Aqua.Slicker  ]; fever [  ]; or chills [  ];          Dental: poor dentition[] ; Last Dentist visit: this mourning   Eye : blurred vision Aqua.Slicker  ]; diplopia [   ]; vision changes [  ];  Amaurosis fugax[  ]; Resp: cough [Y  ];  wheezing[N  ];  hemoptysis[ N ]; shortness of breath[ N ]; paroxysmal nocturnal dyspnea[  ]; dyspnea on exertion[  ]; or orthopnea[  ];  GI:  gallstones[  ], vomiting[  ];  dysphagia[  ]; melena[  ];  hematochezia [  ];  heartburn[  ];   Hx of  Colonoscopy[  N]; GU: kidney stones [  ]; hematuria[  ];   dysuria [  ];   nocturia[  ];  history of     obstruction [ N ]; urinary frequency [N  ]             Skin: rash, swelling[  ];, hair loss[  ];  peripheral edema[  ];  or itching[  ]; Musculosketetal: myalgias[  ];  joint swelling[  ];  joint erythema[  ];  joint pain[  ];  back pain[  ];  Heme/Lymph: bruising[  ];  bleeding[  ];  anemia[  ];  Neuro: TIA[  ];  headaches[  ];  stroke[  ];  vertigo[  ];  seizures[ N ];   paresthesias[  ];  difficulty walking[ N ];  Psych:depression[  ]; anxiety[  ];  Endocrine: diabetes[N  ];  thyroid dysfunction[ N ];  Immunizations: Flu up to date Aqua.Slicker  ]; Pneumococcal up to date [facility]  ];  Other:  Physical Exam: BP 136/70 mmHg  Pulse 65  Resp 20  Ht 5' 11"  (1.803 m)  Wt 205 lb (92.987 kg)  BMI 28.60 kg/m2  SpO2 98%  PHYSICAL EXAMINATION: General appearance: alert, cooperative, appears stated age and no distress Head: Normocephalic, without obvious abnormality, atraumatic Neck: no adenopathy, no carotid bruit, no JVD, supple, symmetrical, trachea midline and thyroid not enlarged, symmetric, no tenderness/mass/nodules Lymph nodes: Cervical, supraclavicular, and axillary nodes normal. Resp: clear to auscultation bilaterally Back: symmetric, no curvature. ROM normal. No CVA tenderness. Cardio: regular rate and rhythm, S1, S2 normal, no murmur, click, rub or gallop, History of heart murmur, but I do not hear one GI: soft, non-tender; bowel sounds normal; no masses,  no organomegaly Extremities: extremities normal, atraumatic, no cyanosis or edema Neurologic: Grossly normal palpable dp and pt pulses Mouth appears healing well.   Diagnostic Studies & Laboratory data:     Recent Radiology Findings:   Nm Pet Image Initial (pi) Skull Base To Thigh  01/14/2015   CLINICAL DATA:  Initial treatment strategy for Lung cancer.  EXAM: NUCLEAR MEDICINE PET SKULL BASE TO THIGH  TECHNIQUE: 12.38 mCi F-18 FDG was injected intravenously. Full-ring PET imaging was performed from the skull  base to thigh after the radiotracer. CT data was obtained and used for attenuation correction and anatomic localization.  FASTING BLOOD GLUCOSE:  Value: 106 mg/dl  COMPARISON:  12/20/2014  FINDINGS: NECK  No hypermetabolic lymph nodes in the neck.  CHEST  There is expected low level FDG uptake associated with the left upper lobe, biopsy proved endobronchial carcinoid. The SUV max is equal to 2.9. Associated postobstructive pneumonitis/atelectasis involving the left upper lobe is again noted.  ABDOMEN/PELVIS  No abnormal hypermetabolic activity within the liver, pancreas, adrenal glands, or spleen. No hypermetabolic lymph nodes in the abdomen or pelvis.Aortic atherosclerosis noted.  SKELETON  There is a sclerotic lesion within the left iliac wing measuring 11 mm, image 219 of series 3. No corresponding FDG uptake noted on the PET images. Degenerative disc disease noted within the thoracic and lumbar spine.  IMPRESSION: 1. Expected low level FDG uptake is associated with the left upper lobe endobronchial carcinoid. 2. Postobstructive atelectasis involves the left upper lobe. 3. No evidence for hypermetabolic metastasis.   Electronically Signed   By: Kerby Moors M.D.   On: 01/14/2015 13:19    I have independently reviewed the above radiology studies  and reviewed the findings  with the patient.    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  I have discussed with the patient the need for resection , without will result in continued obstruction of left upper lobe and potentially lower lobe.  Patient has had his  questions answered and is will to proceed with resection.  I discussed with the patient further details of planned surgery. Plan to proceed Monday 6/27  I  spent 15 minutes counseling the patient face to face and 50% or more the  time was spent in counseling and coordination of care. The total time spent in the appointment was 20 minutes.  Timothy Isaac MD      [address] ,[place] [address] Office [phone]   Beeper [phone]  02/10/2015 9:14 PM

## 2015-02-10 NOTE — Progress Notes (Addendum)
POST OPERATIVE NOTE:  02/10/2015   Timothy Benitez [id number]  VITALS: BP 130/62 mmHg  Pulse 64  Temp(Src) 98.1 F (36.7 C) (Oral)  LABS:  Lab Results  Component Value Date   HGB 16.7 01/29/2015   HCT 49.0 01/29/2015   PLT 186 01/29/2015   BMET    Component Value Date/Time   NA 140 01/29/2015 0813   K 4.3 01/29/2015 0813   CL 103 01/29/2015 0813   GLUCOSE 131* 01/29/2015 0813   BUN 15 01/29/2015 0813   CREATININE 0.90 01/29/2015 0813    No results found for: INR, PROTIME No results found for: PTT   Timothy Benitez is status post extraction of remaining teeth with alveoloplasty and pre-prosthetic surgery as needed in the operating room with general anesthesia on 01/29/2015.   SUBJECTIVE: Patient with minimal oral discomfort. Patient indicates that stitches are still present.  EXAM: There is no sign of infection, heme, or ooze. Sutures are loosely intact. Patient is now completely edentulous. 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 There is a 8 mm x 4 mm area of exposed bone in the retromolar area on the lower left lingual aspect. We will proceed with partial ostectomy today.  PROCEDURE: The patient was given a chlorhexidine gluconate rinse for 30 seconds. Sutures were then removed without complication. Topical local and aesthetic was applied to the lower left lingual exposed bone and soft tissue. Partial ostectomy was performed with a rongeur and bone file. The area was irrigated with copious amounts sterile saline. Patient was then given a postop rinse with chlorhexidine gluconate. Patient tolerated the procedure well.  ASSESSMENT: Post operative course is consistent with dental procedures performed in the OR. Patient is edentulous. 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 There is a 4 x 8 mm area of exposed bone on the lower left lingual aspect retromolar area.  Plan: 1. Use chlorhexidine rinses after breakfast and at bedtime as  prescribed. 2. Use salt water rinses as needed to aid healing in between the chlorhexidine rinses. 3. Brush tongue daily. 4. Advance diet as tolerated. 5. Return to clinic in 1 month for reevaluation of healing of lower left lingual. 6. Follow-up with Dentist of his choice for fabrication of upper and lower complete dentures after adequate healing and once medically stable from the anticipated lung surgery.   Lenn Cal, DDS

## 2015-02-10 NOTE — Patient Instructions (Addendum)
1. Use chlorhexidine rinses after breakfast and at bedtime as prescribed. 2. Use salt water rinses as needed to aid healing in between the chlorhexidine rinses. 3. Brush tongue daily. 4. Advance diet as tolerated. 5. Return to clinic in 1 month for reevaluation of healing. 6. Follow-up with Dentist of his choice for fabrication of upper and lower complete dentures after adequate healing and once medically stable from the anticipated lung surgery.  Dr. Enrique Sack

## 2015-02-12 NOTE — Pre-Procedure Instructions (Signed)
KIT MOLLETT  02/12/2015      [facility] - [place], [place] - [address] [place] [address] Phone: [phone] Fax: [place], [place] [address] Phone: [phone] Fax: [phone]    Your procedure is scheduled on Mon, June 27 @ 7:30 AM  Report to [facility] Admitting at 5:30 AM  Call this number if you have problems the morning of surgery:  [phone]   Remember:  Do not eat food or drink liquids after midnight.  Take these medicines the morning of surgery with A SIP OF WATER:Albuterol<Bring Your Inhaler With You> and Symbicort(Budesonide)              No Goody's,BC's,Aleve,Aspirin,Ibuprofen,Fish Oil,or any Herbal Medications.    Do not wear jewelry.  Do not wear lotions, powders, or perfumes.  You may wear deodorant.  Do not shave 48 hours prior to surgery.  Men may shave face and neck.  Do not bring valuables to the hospital.  [facility] is not responsible for any belongings or valuables.  Contacts, dentures or bridgework may not be worn into surgery.  Leave your suitcase in the car.  After surgery it may be brought to your room.  For patients admitted to the hospital, discharge time will be determined by your treatment team.  Patients discharged the day of surgery will not be allowed to drive home.    Special instructions:  [facility] - Preparing for Surgery  Before surgery, you can play an important role.  Because skin is not sterile, your skin needs to be as free of germs as possible.  You can reduce the number of germs on you skin by washing with CHG (chlorahexidine gluconate) soap before surgery.  CHG is an antiseptic cleaner which kills germs and bonds with the skin to continue killing germs even after washing.  Please DO NOT use if you have an allergy to CHG or antibacterial soaps.  If your skin becomes  reddened/irritated stop using the CHG and inform your nurse when you arrive at Short Stay.  Do not shave (including legs and underarms) for at least 48 hours prior to the first CHG shower.  You may shave your face.  Please follow these instructions carefully:   1.  Shower with CHG Soap the night before surgery and the                                morning of Surgery.  2.  If you choose to wash your hair, wash your hair first as usual with your       normal shampoo.  3.  After you shampoo, rinse your hair and body thoroughly to remove the                      Shampoo.  4.  Use CHG as you would any other liquid soap.  You can apply chg directly       to the skin and wash gently with scrungie or a clean washcloth.  5.  Apply the CHG Soap to your body ONLY FROM THE NECK DOWN.        Do not use on open wounds or open sores.  Avoid contact with your eyes,  ears, mouth and genitals (private parts).  Wash genitals (private parts)       with your normal soap.  6.  Wash thoroughly, paying special attention to the area where your surgery        will be performed.  7.  Thoroughly rinse your body with warm water from the neck down.  8.  DO NOT shower/wash with your normal soap after using and rinsing off       the CHG Soap.  9.  Pat yourself dry with a clean towel.            10.  Wear clean pajamas.            11.  Place clean sheets on your bed the night of your first shower and do not        sleep with pets.  Day of Surgery  Do not apply any lotions/deoderants the morning of surgery.  Please wear clean clothes to the hospital/surgery center.    Please read over the following fact sheets that you were given. Pain Booklet, Coughing and Deep Breathing, Blood Transfusion Information, MRSA Information and Surgical Site Infection Prevention

## 2015-02-13 ENCOUNTER — Encounter (HOSPITAL_COMMUNITY)
Admission: RE | Admit: 2015-02-13 | Discharge: 2015-02-13 | Disposition: A | Discharge status: Home / Self Care | Payer: No Typology Code available for payment source | Source: Ambulatory Visit | Attending: Cardiothoracic Surgery | Admitting: Cardiothoracic Surgery

## 2015-02-13 ENCOUNTER — Encounter (HOSPITAL_COMMUNITY): Payer: Self-pay

## 2015-02-13 VITALS — BP 119/54 | HR 64 | Temp 97.6°F | Resp 20 | Ht 71.0 in | Wt 204.0 lb

## 2015-02-13 DIAGNOSIS — I4510 Unspecified right bundle-branch block: Secondary | ICD-10-CM | POA: Insufficient documentation

## 2015-02-13 DIAGNOSIS — Z87891 Personal history of nicotine dependence: Secondary | ICD-10-CM | POA: Insufficient documentation

## 2015-02-13 DIAGNOSIS — E785 Hyperlipidemia, unspecified: Secondary | ICD-10-CM | POA: Diagnosis not present

## 2015-02-13 DIAGNOSIS — J449 Chronic obstructive pulmonary disease, unspecified: Secondary | ICD-10-CM | POA: Diagnosis not present

## 2015-02-13 DIAGNOSIS — Z01818 Encounter for other preprocedural examination: Secondary | ICD-10-CM | POA: Diagnosis not present

## 2015-02-13 DIAGNOSIS — Z0183 Encounter for blood typing: Secondary | ICD-10-CM | POA: Insufficient documentation

## 2015-02-13 DIAGNOSIS — Z01812 Encounter for preprocedural laboratory examination: Secondary | ICD-10-CM | POA: Insufficient documentation

## 2015-02-13 DIAGNOSIS — G4733 Obstructive sleep apnea (adult) (pediatric): Secondary | ICD-10-CM | POA: Insufficient documentation

## 2015-02-13 DIAGNOSIS — R918 Other nonspecific abnormal finding of lung field: Secondary | ICD-10-CM | POA: Insufficient documentation

## 2015-02-13 DIAGNOSIS — Z79899 Other long term (current) drug therapy: Secondary | ICD-10-CM | POA: Diagnosis not present

## 2015-02-13 HISTORY — DX: Hyperlipidemia, unspecified: E78.5

## 2015-02-13 HISTORY — DX: Pain in unspecified joint: M25.50

## 2015-02-13 HISTORY — DX: Personal history of other diseases of the respiratory system: Z87.09

## 2015-02-13 LAB — COMPREHENSIVE METABOLIC PANEL
ALT: 34 U/L (ref 17–63)
AST: 25 U/L (ref 15–41)
Albumin: 3.7 g/dL (ref 3.5–5.0)
Alkaline Phosphatase: 61 U/L (ref 38–126)
Anion gap: 11 (ref 5–15)
BUN: 13 mg/dL (ref 6–20)
CO2: 24 mmol/L (ref 22–32)
Calcium: 8.9 mg/dL (ref 8.9–10.3)
Chloride: 104 mmol/L (ref 101–111)
Creatinine, Ser: 0.83 mg/dL (ref 0.61–1.24)
GFR calc Af Amer: >60 mL/min (ref >60)
GFR calc non Af Amer: >60 mL/min (ref >60)
Glucose, Bld: 211 mg/dL — ABNORMAL HIGH (ref 65–99)
Potassium: 3.7 mmol/L (ref 3.5–5.1)
Sodium: 139 mmol/L (ref 135–145)
Total Bilirubin: 0.9 mg/dL (ref 0.3–1.2)
Total Protein: 6.1 g/dL — ABNORMAL LOW (ref 6.5–8.1)

## 2015-02-13 LAB — URINALYSIS, ROUTINE W REFLEX MICROSCOPIC
Bilirubin Urine: NEGATIVE
Glucose, UA: >1000 mg/dL — AB
Hgb urine dipstick: NEGATIVE
Ketones, ur: NEGATIVE mg/dL
Leukocytes, UA: NEGATIVE
Nitrite: NEGATIVE
Protein, ur: NEGATIVE mg/dL
Specific Gravity, Urine: 1.022 (ref 1.005–1.030)
Urobilinogen, UA: 0.2 mg/dL (ref 0.0–1.0)
pH: 5 (ref 5.0–8.0)

## 2015-02-13 LAB — CBC
HCT: 40.4 % (ref 39.0–52.0)
Hemoglobin: 14 g/dL (ref 13.0–17.0)
MCH: 29 pg (ref 26.0–34.0)
MCHC: 34.7 g/dL (ref 30.0–36.0)
MCV: 83.6 fL (ref 78.0–100.0)
Platelets: 225 10*3/uL (ref 150–400)
RBC: 4.83 MIL/uL (ref 4.22–5.81)
RDW: 13.5 % (ref 11.5–15.5)
WBC: 7.8 10*3/uL (ref 4.0–10.5)

## 2015-02-13 LAB — SURGICAL PCR SCREEN
MRSA, PCR: NEGATIVE
Staphylococcus aureus: NEGATIVE

## 2015-02-13 LAB — BLOOD GAS, ARTERIAL
Acid-base deficit: 0.3 mmol/L (ref 0.0–2.0)
Bicarbonate: 23.2 mEq/L (ref 20.0–24.0)
Drawn by: 428831
FIO2: 0.21 %
O2 Saturation: 98.9 %
Patient temperature: 98.6
TCO2: 24.2 mmol/L (ref 0–100)
pCO2 arterial: 33.6 mmHg — ABNORMAL LOW (ref 35.0–45.0)
pH, Arterial: 7.453 — ABNORMAL HIGH (ref 7.350–7.450)
pO2, Arterial: 144 mmHg — ABNORMAL HIGH (ref 80.0–100.0)

## 2015-02-13 LAB — TYPE AND SCREEN
ABO/RH(D): O POS
Antibody Screen: NEGATIVE

## 2015-02-13 LAB — ABO/RH: ABO/RH(D): O POS

## 2015-02-13 LAB — PROTIME-INR
INR: 0.94 (ref 0.00–1.49)
Prothrombin Time: 12.8 seconds (ref 11.6–15.2)

## 2015-02-13 LAB — APTT: aPTT: 28 seconds (ref 24–37)

## 2015-02-13 LAB — URINE MICROSCOPIC-ADD ON

## 2015-02-13 NOTE — Progress Notes (Addendum)
Cardiologist denies having one  [facility] in [place]  EKG in epic from 01-29-15  Stress test denies ever having one  Echo denies ever having one  Heart cath denies ever having one

## 2015-02-13 NOTE — Progress Notes (Signed)
Anesthesia Chart Review:  Pt is 54 year old male scheduled for video bronchoscopy, VATS, L upper lobectomy on 02/17/2015 with Dr. Servando Snare.   PMH includes: COPD, lung cancer, OSA, hyperlipidemia. Former smoker. BMI 28.5. S/p multiple dental extractions 01/29/15. S/p endobronchial ultrasound 01/02/15.   Medications include: albuterol, lipitor, symbicort.   Preoperative labs reviewed.  Glucose 211, >'1000mg'$ /dL glucose in urine on UA. Notified Ryan in Dr. Everrett Coombe office.   EKG 01/29/2015: NSR. Left axis deviation. Incomplete right bundle branch block  PFTs 12/30/2014: -moderate obstructive airway disease.  -FVC 3.87, FEV1 2.16, FEV1/FVC 56%.  If no changes, I anticipate pt can proceed with surgery as scheduled.   Willeen Cass, FNP-BC [facility]/Anesthesiology Phone: [phone] 02/13/2015 2:13 PM

## 2015-02-17 ENCOUNTER — Inpatient Hospital Stay (HOSPITAL_COMMUNITY): Payer: No Typology Code available for payment source | Admitting: Emergency Medicine

## 2015-02-17 ENCOUNTER — Encounter (HOSPITAL_COMMUNITY)
Admission: RE | Disposition: A | Discharge status: Home / Self Care | Payer: Self-pay | Source: Ambulatory Visit | Attending: Cardiothoracic Surgery

## 2015-02-17 ENCOUNTER — Inpatient Hospital Stay (HOSPITAL_COMMUNITY): Payer: No Typology Code available for payment source

## 2015-02-17 ENCOUNTER — Inpatient Hospital Stay (HOSPITAL_COMMUNITY)
Admission: RE | Admit: 2015-02-17 | Discharge: 2015-02-21 | DRG: 164 | Disposition: A | Discharge status: Home / Self Care | Payer: No Typology Code available for payment source | Source: Ambulatory Visit | Attending: Cardiothoracic Surgery | Admitting: Cardiothoracic Surgery

## 2015-02-17 ENCOUNTER — Inpatient Hospital Stay (HOSPITAL_COMMUNITY): Payer: No Typology Code available for payment source | Admitting: Certified Registered Nurse Anesthetist

## 2015-02-17 ENCOUNTER — Encounter (HOSPITAL_COMMUNITY): Payer: Self-pay | Admitting: *Deleted

## 2015-02-17 DIAGNOSIS — C7A090 Malignant carcinoid tumor of the bronchus and lung: Secondary | ICD-10-CM | POA: Diagnosis present

## 2015-02-17 DIAGNOSIS — G4730 Sleep apnea, unspecified: Secondary | ICD-10-CM | POA: Diagnosis present

## 2015-02-17 DIAGNOSIS — J449 Chronic obstructive pulmonary disease, unspecified: Secondary | ICD-10-CM | POA: Diagnosis present

## 2015-02-17 DIAGNOSIS — R339 Retention of urine, unspecified: Secondary | ICD-10-CM | POA: Diagnosis not present

## 2015-02-17 DIAGNOSIS — J9811 Atelectasis: Secondary | ICD-10-CM | POA: Diagnosis present

## 2015-02-17 DIAGNOSIS — R918 Other nonspecific abnormal finding of lung field: Secondary | ICD-10-CM

## 2015-02-17 DIAGNOSIS — C3412 Malignant neoplasm of upper lobe, left bronchus or lung: Secondary | ICD-10-CM | POA: Diagnosis not present

## 2015-02-17 DIAGNOSIS — J939 Pneumothorax, unspecified: Secondary | ICD-10-CM

## 2015-02-17 DIAGNOSIS — F17290 Nicotine dependence, other tobacco product, uncomplicated: Secondary | ICD-10-CM | POA: Diagnosis present

## 2015-02-17 DIAGNOSIS — Z9689 Presence of other specified functional implants: Secondary | ICD-10-CM

## 2015-02-17 HISTORY — PX: VIDEO BRONCHOSCOPY: SHX5072

## 2015-02-17 HISTORY — PX: VIDEO ASSISTED THORACOSCOPY (VATS)/ LOBECTOMY: SHX6169

## 2015-02-17 HISTORY — PX: THORACOTOMY/LOBECTOMY: SHX6116

## 2015-02-17 HISTORY — DX: Malignant neoplasm of unspecified part of unspecified bronchus or lung: C34.90

## 2015-02-17 LAB — GLUCOSE, CAPILLARY
Glucose-Capillary: 131 mg/dL — ABNORMAL HIGH (ref 65–99)
Glucose-Capillary: 169 mg/dL — ABNORMAL HIGH (ref 65–99)

## 2015-02-17 SURGERY — BRONCHOSCOPY, VIDEO-ASSISTED
Anesthesia: General | Site: Chest

## 2015-02-17 MED ORDER — STERILE WATER FOR INJECTION IJ SOLN
INTRAMUSCULAR | Status: AC
  Filled 2015-02-17: qty 10

## 2015-02-17 MED ORDER — DEXAMETHASONE SODIUM PHOSPHATE 4 MG/ML IJ SOLN
INTRAMUSCULAR | Status: DC | PRN
  Administered 2015-02-17: 8 mg via INTRAVENOUS

## 2015-02-17 MED ORDER — ROCURONIUM BROMIDE 50 MG/5ML IV SOLN
INTRAVENOUS | Status: AC
  Filled 2015-02-17: qty 1

## 2015-02-17 MED ORDER — SUCCINYLCHOLINE CHLORIDE 20 MG/ML IJ SOLN
INTRAMUSCULAR | Status: AC
  Filled 2015-02-17: qty 1

## 2015-02-17 MED ORDER — DEXTROSE 5 % IV SOLN
INTRAVENOUS | Status: AC
  Administered 2015-02-17: 1.5 g via INTRAVENOUS
  Filled 2015-02-17: qty 1.5

## 2015-02-17 MED ORDER — EPHEDRINE SULFATE 50 MG/ML IJ SOLN
INTRAMUSCULAR | Status: AC
  Filled 2015-02-17: qty 1

## 2015-02-17 MED ORDER — PHENYLEPHRINE HCL 10 MG/ML IJ SOLN
INTRAMUSCULAR | Status: DC | PRN
  Administered 2015-02-17: 80 ug via INTRAVENOUS

## 2015-02-17 MED ORDER — DEXTROSE 5 % IV SOLN
1.5000 g | INTRAVENOUS | Status: AC
  Administered 2015-02-17 (×2): 1.5 g via INTRAVENOUS

## 2015-02-17 MED ORDER — TRAMADOL HCL 50 MG PO TABS
50.0000 mg | ORAL_TABLET | Freq: Four times a day (QID) | ORAL | Status: DC | PRN
  Administered 2015-02-18: 100 mg via ORAL
  Filled 2015-02-17: qty 2

## 2015-02-17 MED ORDER — BUPIVACAINE 0.5 % ON-Q PUMP SINGLE CATH 400 ML
400.0000 mL | INJECTION | Status: DC
Start: 2015-02-17 — End: 2015-02-17
  Administered 2015-02-17: 400 mL
  Filled 2015-02-17: qty 400

## 2015-02-17 MED ORDER — MIDAZOLAM HCL 2 MG/2ML IJ SOLN
INTRAMUSCULAR | Status: AC
  Filled 2015-02-17: qty 2

## 2015-02-17 MED ORDER — FENTANYL CITRATE (PF) 250 MCG/5ML IJ SOLN
INTRAMUSCULAR | Status: AC
  Filled 2015-02-17: qty 5

## 2015-02-17 MED ORDER — DIPHENHYDRAMINE HCL 12.5 MG/5ML PO ELIX
12.5000 mg | ORAL_SOLUTION | Freq: Four times a day (QID) | ORAL | Status: DC | PRN
  Filled 2015-02-17: qty 5

## 2015-02-17 MED ORDER — NEOSTIGMINE METHYLSULFATE 10 MG/10ML IV SOLN
INTRAVENOUS | Status: AC
Start: 2015-02-17 — End: 2015-02-17
  Filled 2015-02-17: qty 1

## 2015-02-17 MED ORDER — LUNG SURGERY BOOK
Freq: Once | Status: AC
  Administered 2015-02-17: 20:00:00
  Filled 2015-02-17: qty 1

## 2015-02-17 MED ORDER — ROCURONIUM BROMIDE 50 MG/5ML IV SOLN
INTRAVENOUS | Status: AC
  Filled 2015-02-17: qty 5

## 2015-02-17 MED ORDER — PROPOFOL 10 MG/ML IV BOLUS
INTRAVENOUS | Status: AC
  Filled 2015-02-17: qty 20

## 2015-02-17 MED ORDER — 0.9 % SODIUM CHLORIDE (POUR BTL) OPTIME
TOPICAL | Status: DC | PRN
  Administered 2015-02-17: 2000 mL

## 2015-02-17 MED ORDER — LIDOCAINE HCL (CARDIAC) 20 MG/ML IV SOLN
INTRAVENOUS | Status: DC | PRN
  Administered 2015-02-17: 60 mg via INTRAVENOUS

## 2015-02-17 MED ORDER — ACETAMINOPHEN 500 MG PO TABS
1000.0000 mg | ORAL_TABLET | Freq: Four times a day (QID) | ORAL | Status: DC
  Administered 2015-02-17 – 2015-02-21 (×14): 1000 mg via ORAL
  Filled 2015-02-17 (×20): qty 2

## 2015-02-17 MED ORDER — GLYCOPYRROLATE 0.2 MG/ML IJ SOLN
INTRAMUSCULAR | Status: DC | PRN
  Administered 2015-02-17: 0.2 mg via INTRAVENOUS
  Administered 2015-02-17: .8 mg via INTRAVENOUS

## 2015-02-17 MED ORDER — SODIUM CHLORIDE 0.9 % IV SOLN: INTRAVENOUS | Status: DC

## 2015-02-17 MED ORDER — BUDESONIDE-FORMOTEROL FUMARATE 160-4.5 MCG/ACT IN AERO
2.0000 | INHALATION_SPRAY | Freq: Two times a day (BID) | RESPIRATORY_TRACT | Status: DC
  Administered 2015-02-17 – 2015-02-21 (×8): 2 via RESPIRATORY_TRACT
  Filled 2015-02-17: qty 6

## 2015-02-17 MED ORDER — HYDROMORPHONE HCL 1 MG/ML IJ SOLN
INTRAMUSCULAR | Status: AC
  Filled 2015-02-17: qty 2

## 2015-02-17 MED ORDER — ALBUTEROL SULFATE (2.5 MG/3ML) 0.083% IN NEBU
2.5000 mg | INHALATION_SOLUTION | RESPIRATORY_TRACT | Status: AC
  Administered 2015-02-17 – 2015-02-18 (×5): 2.5 mg via RESPIRATORY_TRACT
  Filled 2015-02-17 (×5): qty 3

## 2015-02-17 MED ORDER — SENNOSIDES-DOCUSATE SODIUM 8.6-50 MG PO TABS
1.0000 | ORAL_TABLET | Freq: Every day | ORAL | Status: DC
  Administered 2015-02-17 – 2015-02-20 (×4): 1 via ORAL
  Filled 2015-02-17 (×5): qty 1

## 2015-02-17 MED ORDER — PANTOPRAZOLE SODIUM 40 MG PO TBEC
40.0000 mg | DELAYED_RELEASE_TABLET | Freq: Every day | ORAL | Status: DC
Start: 2015-02-17 — End: 2015-02-21
  Administered 2015-02-17 – 2015-02-21 (×5): 40 mg via ORAL
  Filled 2015-02-17 (×5): qty 1

## 2015-02-17 MED ORDER — INSULIN ASPART 100 UNIT/ML ~~LOC~~ SOLN
0.0000 [IU] | Freq: Four times a day (QID) | SUBCUTANEOUS | Status: DC
  Administered 2015-02-17: 4 [IU] via SUBCUTANEOUS
  Administered 2015-02-18 (×2): 2 [IU] via SUBCUTANEOUS

## 2015-02-17 MED ORDER — ACETAMINOPHEN 160 MG/5ML PO SOLN
1000.0000 mg | Freq: Four times a day (QID) | ORAL | Status: DC
  Filled 2015-02-17: qty 40

## 2015-02-17 MED ORDER — BISACODYL 5 MG PO TBEC
10.0000 mg | DELAYED_RELEASE_TABLET | Freq: Every day | ORAL | Status: DC
  Administered 2015-02-18 – 2015-02-21 (×4): 10 mg via ORAL
  Filled 2015-02-17 (×4): qty 2

## 2015-02-17 MED ORDER — FENTANYL 10 MCG/ML IV SOLN
INTRAVENOUS | Status: DC
  Administered 2015-02-17: 16:00:00 via INTRAVENOUS
  Administered 2015-02-17: 120 ug via INTRAVENOUS
  Administered 2015-02-18: 23:00:00 via INTRAVENOUS
  Administered 2015-02-18: 135 ug via INTRAVENOUS
  Administered 2015-02-18: 75 ug via INTRAVENOUS
  Administered 2015-02-18: 165 ug via INTRAVENOUS
  Administered 2015-02-18: 04:00:00 via INTRAVENOUS
  Administered 2015-02-18: 105 ug via INTRAVENOUS
  Administered 2015-02-18: 90 ug via INTRAVENOUS
  Administered 2015-02-18 – 2015-02-19 (×2): 105 ug via INTRAVENOUS
  Administered 2015-02-19: 180 ug via INTRAVENOUS
  Administered 2015-02-19: 120 ug via INTRAVENOUS
  Administered 2015-02-19: 0 ug via INTRAVENOUS
  Administered 2015-02-19: 135 ug/h via INTRAVENOUS
  Administered 2015-02-20: 0 ug via INTRAVENOUS
  Filled 2015-02-17 (×3): qty 50

## 2015-02-17 MED ORDER — MIDAZOLAM HCL 5 MG/5ML IJ SOLN
INTRAMUSCULAR | Status: DC | PRN
  Administered 2015-02-17 (×2): 2 mg via INTRAVENOUS

## 2015-02-17 MED ORDER — NEOSTIGMINE METHYLSULFATE 10 MG/10ML IV SOLN
INTRAVENOUS | Status: DC | PRN
  Administered 2015-02-17: 5 mg via INTRAVENOUS

## 2015-02-17 MED ORDER — ROCURONIUM BROMIDE 100 MG/10ML IV SOLN
INTRAVENOUS | Status: DC | PRN
  Administered 2015-02-17 (×3): 50 mg via INTRAVENOUS
  Administered 2015-02-17: 25 mg via INTRAVENOUS
  Administered 2015-02-17: 50 mg via INTRAVENOUS
  Administered 2015-02-17: 25 mg via INTRAVENOUS

## 2015-02-17 MED ORDER — NALOXONE HCL 0.4 MG/ML IJ SOLN: 0.4000 mg | INTRAMUSCULAR | Status: DC | PRN

## 2015-02-17 MED ORDER — POTASSIUM CHLORIDE 10 MEQ/50ML IV SOLN
10.0000 meq | Freq: Every day | INTRAVENOUS | Status: DC | PRN
  Filled 2015-02-17: qty 50

## 2015-02-17 MED ORDER — PROPOFOL INFUSION 10 MG/ML OPTIME
INTRAVENOUS | Status: DC | PRN
  Administered 2015-02-17: 100 ug/kg/min via INTRAVENOUS

## 2015-02-17 MED ORDER — DEXTROSE 5 % IV SOLN
1.5000 g | Freq: Once | INTRAVENOUS | Status: DC
  Filled 2015-02-17: qty 1.5

## 2015-02-17 MED ORDER — BUPIVACAINE HCL (PF) 0.5 % IJ SOLN
INTRAMUSCULAR | Status: DC | PRN
  Administered 2015-02-17: 30 mL

## 2015-02-17 MED ORDER — PNEUMOCOCCAL VAC POLYVALENT 25 MCG/0.5ML IJ INJ
0.5000 mL | INJECTION | INTRAMUSCULAR | Status: AC
  Administered 2015-02-21: 0.5 mL via INTRAMUSCULAR
  Filled 2015-02-17: qty 0.5

## 2015-02-17 MED ORDER — LACTATED RINGERS IV SOLN
INTRAVENOUS | Status: DC | PRN
  Administered 2015-02-17 (×3): via INTRAVENOUS

## 2015-02-17 MED ORDER — ONDANSETRON HCL 4 MG/2ML IJ SOLN
INTRAMUSCULAR | Status: DC | PRN
  Administered 2015-02-17: 4 mg via INTRAVENOUS

## 2015-02-17 MED ORDER — CHLORHEXIDINE GLUCONATE 0.12 % MT SOLN: 15.0000 mL | Freq: Two times a day (BID) | OROMUCOSAL | Status: DC

## 2015-02-17 MED ORDER — OXYCODONE HCL 5 MG PO TABS: 5.0000 mg | ORAL_TABLET | ORAL | Status: DC | PRN

## 2015-02-17 MED ORDER — EPHEDRINE SULFATE 50 MG/ML IJ SOLN
INTRAMUSCULAR | Status: DC | PRN
  Administered 2015-02-17: 5 mg via INTRAVENOUS

## 2015-02-17 MED ORDER — DIPHENHYDRAMINE HCL 50 MG/ML IJ SOLN: 12.5000 mg | Freq: Four times a day (QID) | INTRAMUSCULAR | Status: DC | PRN

## 2015-02-17 MED ORDER — GLYCOPYRROLATE 0.2 MG/ML IJ SOLN
INTRAMUSCULAR | Status: AC
  Filled 2015-02-17: qty 4

## 2015-02-17 MED ORDER — HYDROMORPHONE HCL 1 MG/ML IJ SOLN
0.2500 mg | INTRAMUSCULAR | Status: DC | PRN
  Administered 2015-02-17: 0.5 mg via INTRAVENOUS

## 2015-02-17 MED ORDER — ONDANSETRON HCL 4 MG/2ML IJ SOLN: 4.0000 mg | Freq: Four times a day (QID) | INTRAMUSCULAR | Status: DC | PRN

## 2015-02-17 MED ORDER — PROPOFOL 10 MG/ML IV BOLUS
INTRAVENOUS | Status: DC | PRN
  Administered 2015-02-17: 50 mg via INTRAVENOUS
  Administered 2015-02-17: 200 mg via INTRAVENOUS

## 2015-02-17 MED ORDER — KCL IN DEXTROSE-NACL 20-5-0.9 MEQ/L-%-% IV SOLN
INTRAVENOUS | Status: DC
  Administered 2015-02-17 – 2015-02-19 (×3): via INTRAVENOUS
  Filled 2015-02-17 (×6): qty 1000

## 2015-02-17 MED ORDER — SODIUM CHLORIDE 0.9 % IJ SOLN: 9.0000 mL | INTRAMUSCULAR | Status: DC | PRN

## 2015-02-17 MED ORDER — PROMETHAZINE HCL 25 MG/ML IJ SOLN: 6.2500 mg | INTRAMUSCULAR | Status: DC | PRN

## 2015-02-17 MED ORDER — CETYLPYRIDINIUM CHLORIDE 0.05 % MT LIQD
7.0000 mL | Freq: Two times a day (BID) | OROMUCOSAL | Status: DC
  Administered 2015-02-17 – 2015-02-21 (×6): 7 mL via OROMUCOSAL

## 2015-02-17 MED ORDER — ATORVASTATIN CALCIUM 20 MG PO TABS
20.0000 mg | ORAL_TABLET | Freq: Every evening | ORAL | Status: DC
  Administered 2015-02-18 – 2015-02-20 (×3): 20 mg via ORAL
  Filled 2015-02-17 (×5): qty 1

## 2015-02-17 MED ORDER — DEXTROSE 50 % IV SOLN
25.0000 mL | Freq: Once | INTRAVENOUS | Status: DC
  Filled 2015-02-17: qty 25

## 2015-02-17 MED ORDER — FENTANYL CITRATE (PF) 100 MCG/2ML IJ SOLN
INTRAMUSCULAR | Status: DC | PRN
  Administered 2015-02-17 (×5): 100 ug via INTRAVENOUS
  Administered 2015-02-17 (×3): 50 ug via INTRAVENOUS
  Administered 2015-02-17: 100 ug via INTRAVENOUS
  Administered 2015-02-17 (×2): 50 ug via INTRAVENOUS
  Administered 2015-02-17: 100 ug via INTRAVENOUS
  Administered 2015-02-17: 50 ug via INTRAVENOUS

## 2015-02-17 MED ORDER — DEXTROSE 5 % IV SOLN
1.5000 g | Freq: Two times a day (BID) | INTRAVENOUS | Status: AC
  Administered 2015-02-17 – 2015-02-18 (×2): 1.5 g via INTRAVENOUS
  Filled 2015-02-17 (×2): qty 1.5

## 2015-02-17 SURGICAL SUPPLY — 89 items
APPLICATOR TIP EXT COSEAL (VASCULAR PRODUCTS) IMPLANT
BLADE SURG 11 STRL SS (BLADE) ×4 IMPLANT
BLADE SURG 15 STRL LF DISP TIS (BLADE) ×3 IMPLANT
BLADE SURG 15 STRL SS (BLADE) ×1
BRUSH CYTOL CELLEBRITY 1.5X140 (MISCELLANEOUS) IMPLANT
CANISTER SUCTION 2500CC (MISCELLANEOUS) ×4 IMPLANT
CATH KIT ON Q 5IN SLV (PAIN MANAGEMENT) IMPLANT
CATH KIT ON-Q SILVERSOAK 5IN (CATHETERS) ×4 IMPLANT
CATH THORACIC 28FR (CATHETERS) IMPLANT
CATH THORACIC 36FR (CATHETERS) IMPLANT
CATH THORACIC 36FR RT ANG (CATHETERS) IMPLANT
CLIP TI MEDIUM 6 (CLIP) IMPLANT
CONN ST 1/4X3/8  BEN (MISCELLANEOUS) ×3
CONN ST 1/4X3/8 BEN (MISCELLANEOUS) ×9 IMPLANT
CONN Y 3/8X3/8X3/8  BEN (MISCELLANEOUS)
CONN Y 3/8X3/8X3/8 BEN (MISCELLANEOUS) IMPLANT
CONT SPEC 4OZ CLIKSEAL STRL BL (MISCELLANEOUS) ×24 IMPLANT
COVER SURGICAL LIGHT HANDLE (MISCELLANEOUS) IMPLANT
COVER TABLE BACK 60X90 (DRAPES) ×4 IMPLANT
CUTTER ECHEON FLEX ENDO 45 340 (ENDOMECHANICALS) ×4 IMPLANT
DERMABOND ADVANCED (GAUZE/BANDAGES/DRESSINGS) ×1
DERMABOND ADVANCED .7 DNX12 (GAUZE/BANDAGES/DRESSINGS) ×3 IMPLANT
DRAIN CHANNEL 28F RND 3/8 FF (WOUND CARE) ×8 IMPLANT
DRAPE LAPAROSCOPIC ABDOMINAL (DRAPES) ×4 IMPLANT
DRAPE SLUSH/WARMER DISC (DRAPES) ×4 IMPLANT
DRAPE WARM FLUID 44X44 (DRAPE) IMPLANT
DRILL BIT 7/64X5 (BIT) IMPLANT
ELECT BLADE 4.0 EZ CLEAN MEGAD (MISCELLANEOUS) ×4
ELECT REM PT RETURN 9FT ADLT (ELECTROSURGICAL) ×4
ELECTRODE BLDE 4.0 EZ CLN MEGD (MISCELLANEOUS) ×3 IMPLANT
ELECTRODE REM PT RTRN 9FT ADLT (ELECTROSURGICAL) ×3 IMPLANT
FORCEPS BIOP RJ4 1.8 (CUTTING FORCEPS) IMPLANT
GAUZE SPONGE 4X4 12PLY STRL (GAUZE/BANDAGES/DRESSINGS) ×4 IMPLANT
GLOVE BIO SURGEON STRL SZ 6.5 (GLOVE) ×24 IMPLANT
GLOVE BIOGEL PI IND STRL 7.0 (GLOVE) ×12 IMPLANT
GLOVE BIOGEL PI INDICATOR 7.0 (GLOVE) ×4
GOWN STRL REUS W/ TWL LRG LVL3 (GOWN DISPOSABLE) ×12 IMPLANT
GOWN STRL REUS W/TWL LRG LVL3 (GOWN DISPOSABLE) ×4
KIT BASIN OR (CUSTOM PROCEDURE TRAY) ×4 IMPLANT
KIT CLEAN ENDO COMPLIANCE (KITS) ×4 IMPLANT
KIT ROOM TURNOVER OR (KITS) ×4 IMPLANT
MARKER SKIN DUAL TIP RULER LAB (MISCELLANEOUS) ×4 IMPLANT
NEEDLE BIOPSY TRANSBRONCH 21G (NEEDLE) IMPLANT
NS IRRIG 1000ML POUR BTL (IV SOLUTION) ×12 IMPLANT
OIL SILICONE PENTAX (PARTS (SERVICE/REPAIRS)) ×4 IMPLANT
PACK CHEST (CUSTOM PROCEDURE TRAY) ×4 IMPLANT
PAD ARMBOARD 7.5X6 YLW CONV (MISCELLANEOUS) ×8 IMPLANT
PASSER SUT SWANSON 36MM LOOP (INSTRUMENTS) ×4 IMPLANT
RELOAD GOLD ECHELON 45 (STAPLE) ×12 IMPLANT
RELOAD STAPLER LINE PROX 30 GR (STAPLE) ×3 IMPLANT
SEALANT PROGEL (MISCELLANEOUS) ×4 IMPLANT
SEALANT SURG COSEAL 4ML (VASCULAR PRODUCTS) IMPLANT
SEALANT SURG COSEAL 8ML (VASCULAR PRODUCTS) IMPLANT
SOLUTION ANTI FOG 6CC (MISCELLANEOUS) ×4 IMPLANT
SPONGE LAP 18X18 X RAY DECT (DISPOSABLE) ×4 IMPLANT
STAPLE RELOAD 2.5MM WHITE (STAPLE) ×20 IMPLANT
STAPLER RELOAD LINE PROX 30 GR (STAPLE) ×4
STAPLER VASCULAR ECHELON 35 (CUTTER) ×4 IMPLANT
SUT PDS AB 4-0 RB1 27 (SUTURE) ×40 IMPLANT
SUT PDS AB 4-0 SH 27 (SUTURE) ×4 IMPLANT
SUT PROLENE 3 0 SH DA (SUTURE) IMPLANT
SUT PROLENE 4 0 RB 1 (SUTURE)
SUT PROLENE 4-0 RB1 .5 CRCL 36 (SUTURE) IMPLANT
SUT SILK  1 MH (SUTURE) ×3
SUT SILK 1 MH (SUTURE) ×9 IMPLANT
SUT SILK 1 TIES 10X30 (SUTURE) IMPLANT
SUT SILK 2 0SH CR/8 30 (SUTURE) IMPLANT
SUT SILK 3 0 SH CR/8 (SUTURE) ×4 IMPLANT
SUT VIC AB 1 CTX 18 (SUTURE) IMPLANT
SUT VIC AB 1 CTX 36 (SUTURE)
SUT VIC AB 1 CTX36XBRD ANBCTR (SUTURE) IMPLANT
SUT VIC AB 2-0 CTX 36 (SUTURE) IMPLANT
SUT VIC AB 3-0 X1 27 (SUTURE) ×4 IMPLANT
SUT VICRYL 0 UR6 27IN ABS (SUTURE) IMPLANT
SUT VICRYL 2 TP 1 (SUTURE) ×4 IMPLANT
SYR 20ML ECCENTRIC (SYRINGE) ×4 IMPLANT
SYSTEM SAHARA CHEST DRAIN RE-I (WOUND CARE) ×4 IMPLANT
TAPE CLOTH 4X10 WHT NS (GAUZE/BANDAGES/DRESSINGS) ×4 IMPLANT
TAPE CLOTH SURG 4X10 WHT LF (GAUZE/BANDAGES/DRESSINGS) ×4 IMPLANT
TAPE UMBILICAL COTTON 1/8X30 (MISCELLANEOUS) IMPLANT
TIP APPLICATOR SPRAY EXTEND 16 (VASCULAR PRODUCTS) ×4 IMPLANT
TOWEL OR 17X24 6PK STRL BLUE (TOWEL DISPOSABLE) ×8 IMPLANT
TOWEL OR 17X26 10 PK STRL BLUE (TOWEL DISPOSABLE) ×8 IMPLANT
TRAP SPECIMEN MUCOUS 40CC (MISCELLANEOUS) ×4 IMPLANT
TRAY FOLEY CATH 16FRSI W/METER (SET/KITS/TRAYS/PACK) ×4 IMPLANT
TROCAR XCEL BLUNT TIP 100MML (ENDOMECHANICALS) IMPLANT
TUBE CONNECTING 20X1/4 (TUBING) ×8 IMPLANT
TUNNELER SHEATH ON-Q 11GX8 DSP (PAIN MANAGEMENT) ×4 IMPLANT
WATER STERILE IRR 1000ML POUR (IV SOLUTION) ×8 IMPLANT

## 2015-02-17 NOTE — Anesthesia Procedure Notes (Addendum)
Procedure Name: Intubation Date/Time: 02/17/2015 7:52 AM Performed by: Ollen Bowl Pre-anesthesia Checklist: Patient identified, Emergency Drugs available, Suction available, Patient being monitored and Timeout performed Patient Re-evaluated:Patient Re-evaluated prior to inductionOxygen Delivery Method: Circle system utilized and Simple face mask Preoxygenation: Pre-oxygenation with 100% oxygen Intubation Type: IV induction Ventilation: Mask ventilation without difficulty and Oral airway inserted - appropriate to patient size Laryngoscope Size: Sabra Heck and 3 Grade View: Grade I Tube type: Oral Tube size: 8.0 mm Number of attempts: 1 Airway Equipment and Method: Patient positioned with wedge pillow and Stylet Placement Confirmation: ETT inserted through vocal cords under direct vision,  positive ETCO2 and breath sounds checked- equal and bilateral Secured at: 23 cm Tube secured with: Tape Dental Injury: Teeth and Oropharynx as per pre-operative assessment  Comments: Placed for bronch then dc    Procedure Name: Intubation Date/Time: 02/17/2015 8:17 AM Performed by: Ollen Bowl Pre-anesthesia Checklist: Patient identified, Emergency Drugs available, Suction available, Patient being monitored and Timeout performed Patient Re-evaluated:Patient Re-evaluated prior to inductionOxygen Delivery Method: Circle system utilized and Simple face mask Intubation Type: Inhalational induction Laryngoscope Size: Miller and 3 Grade View: Grade I Endobronchial tube: Right and 41 Fr Number of attempts: 1 Airway Equipment and Method: Patient positioned with wedge pillow and Stylet Placement Confirmation: ETT inserted through vocal cords under direct vision,  positive ETCO2 and breath sounds checked- equal and bilateral Tube secured with: Tape Dental Injury: Teeth and Oropharynx as per pre-operative assessment     Procedure Name: Intubation Date/Time: 02/17/2015 9:07 AM Performed by:  Ollen Bowl Pre-anesthesia Checklist: Patient identified, Emergency Drugs available, Suction available and Patient being monitored Patient Re-evaluated:Patient Re-evaluated prior to inductionOxygen Delivery Method: Circle system utilized and Simple face mask Preoxygenation: Pre-oxygenation with 100% oxygen Intubation Type: Inhalational induction Laryngoscope Size: Mac and 3 Grade View: Grade I Endobronchial tube: Right, Double lumen EBT, EBT position confirmed by auscultation and EBT position confirmed by fiberoptic bronchoscope and 39 Fr Number of attempts: 1 Airway Equipment and Method: Patient positioned with wedge pillow and Stylet Placement Confirmation: ETT inserted through vocal cords under direct vision,  positive ETCO2 and breath sounds checked- equal and bilateral Tube secured with: Tape Dental Injury: Teeth and Oropharynx as per pre-operative assessment     Procedure Name: Intubation Date/Time: 02/17/2015 9:54 AM Performed by: Ollen Bowl Pre-anesthesia Checklist: Patient identified, Emergency Drugs available, Suction available and Patient being monitored Patient Re-evaluated:Patient Re-evaluated prior to inductionOxygen Delivery Method: Circle system utilized Preoxygenation: Pre-oxygenation with 100% oxygen Intubation Type: Inhalational induction Laryngoscope Size: Mac and 3 Grade View: Grade I Endobronchial tube: Left, Double lumen EBT, EBT position confirmed by auscultation and EBT position confirmed by fiberoptic bronchoscope and 39 Fr Number of attempts: 1 Airway Equipment and Method: Patient positioned with wedge pillow and Stylet Placement Confirmation: ETT inserted through vocal cords under direct vision,  positive ETCO2 and breath sounds checked- equal and bilateral Tube secured with: Tape Dental Injury: Teeth and Oropharynx as per pre-operative assessment

## 2015-02-17 NOTE — Anesthesia Postprocedure Evaluation (Signed)
  Anesthesia Post-op Note  Patient: Timothy Benitez  Procedure(s) Performed: Procedure(s): VIDEO BRONCHOSCOPY (N/A) THORACOTOMY WITH LEFT UPPER LOBECTOMY WITH LYMPH NODE DISSECTION, OPEN CLOSURE OF LEFT UPPER LOBE BRONCHUS, INTERCOSTAL MUSCLE FLAP COVERAGE OF LEFT UPPER LOBE BRONCHIAL STUMP, PLACEMENT OF ON-Q (Left)  Patient Location: PACU  Anesthesia Type:General  Level of Consciousness: awake  Airway and Oxygen Therapy: Patient Spontanous Breathing  Post-op Pain: mild  Post-op Assessment: Post-op Vital signs reviewed              Post-op Vital Signs: Reviewed  Last Vitals:  Filed Vitals:   02/17/15 1639  BP: 100/56  Pulse: 79  Temp: 36.6 C  Resp: 18    Complications: No apparent anesthesia complications

## 2015-02-17 NOTE — Anesthesia Preprocedure Evaluation (Signed)
Anesthesia Evaluation  Patient identified by MRN, date of birth, ID band Patient awake    Reviewed: Allergy & Precautions, NPO status , Patient's Chart, lab work & pertinent test results, reviewed documented beta blocker date and time   Airway Mallampati: II  TM Distance: >3 FB Neck ROM: Full    Dental  (+) Chipped, Missing, Poor Dentition   Pulmonary sleep apnea , COPDformer smoker,  - rhonchi  + decreased breath sounds      Cardiovascular negative cardio ROS  + Valvular Problems/Murmurs Rhythm:Regular Rate:Normal     Neuro/Psych negative neurological ROS  negative psych ROS   GI/Hepatic negative GI ROS, Neg liver ROS,   Endo/Other  negative endocrine ROS  Renal/GU      Musculoskeletal   Abdominal   Peds  Hematology negative hematology ROS (+)   Anesthesia Other Findings Very poor teeth  Reproductive/Obstetrics negative OB ROS                             Anesthesia Physical Anesthesia Plan  ASA: III  Anesthesia Plan: General   Post-op Pain Management:    Induction: Intravenous  Airway Management Planned: Double Lumen EBT  Additional Equipment: Arterial line  Intra-op Plan:   Post-operative Plan: Extubation in OR  Informed Consent: I have reviewed the patients History and Physical, chart, labs and discussed the procedure including the risks, benefits and alternatives for the proposed anesthesia with the patient or authorized representative who has indicated his/her understanding and acceptance.   Dental advisory given  Plan Discussed with: CRNA and Surgeon  Anesthesia Plan Comments:         Anesthesia Quick Evaluation

## 2015-02-17 NOTE — Progress Notes (Signed)
CT surgery p.m. Rounds  Status post left upper lobectomy, resting quietly Normal sinus rhythm with O2 saturation greater than 95% No airleak from chest tube and minimal drainage Patient doing well

## 2015-02-17 NOTE — H&P (Signed)
[address]       [place] [address]             [phone]                    Namish F Mcelroy [place] Medical Record #[id number] Date of Birth: 06/25/1961  Referring: Dr Faith Rogue Primary Care: [name], NP  Chief Complaint:    Left lung mass   History of Present Illness:    [name] 54 y.o. male is seen in the office last week for recent diagnosis of carcinoid  of the left upper lobe. He was seen by primary care  for evaluation of a cough.  cause  A chest x-ray was obtained. The chest x-ray was abnormal which led to a CT scan. The CT scan showed a left upper lobe mass with atelectasis of the left upper lobe. The patient is sent here for further evaluation.  There are no associated symptoms. He denied any significant hemoptysis weight loss fever or chills. He denies any chest pain. The cough has now abated. He does have a history of smoking 1-2 packs cigarettes a day. He quit smoking  a year ago but currently smokes cigars.  1-2 alcoholic drinks per week. 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 Recent bronchoscopy done   From Bronch:  No images in EPIC :The Bronchoscope was inserted in to endotracheal tube. The Airway was inspected(Rt lung and Left Lung). There Was a large Endobronchial Tumor/mass located at proximal orifice of Left upper Lobe with t 100% occlusion of airway. The lesion was friable and bleeds easily. Airway exam proceeded with findings, technical procedures, and specimen collection as noted below. At the end of exam the scope was withdrawn without incident. Impression and Plan as noted below.       [facility]:  A. LUNG, LEFT UPPER LOBE; BRONCHOSCOPY WITH BIOPSY:  - CARCINOID TUMOR.   Comment:  The neoplastic cells demonstrate diffuse and strong membranous staining  with CD 56. Ki-67 labels less than 5% of neoplastic cells.  Less than 2  mitoses identified in 2 mm area. Necrosis is not identified.   Patient returns today after dental extraction. Dr Enrique Sack reports gums healing well.   Current Activity/ Functional Status:  Patient is independent with mobility/ambulation, transfers, ADL's, IADL's.   Zubrod Score: At the time of surgery this patient's most appropriate activity status/level should be described as: [x]     0    Normal activity, no symptoms []     1    Restricted in physical strenuous activity but ambulatory, able to do out light work []     2    Ambulatory and capable of self care, unable to do work activities, up and about               >50 % of waking hours                              []     3    Only limited self care, in bed greater than 50% of waking hours []     4    Completely disabled, no self care, confined to bed or chair []     5    Moribund   Past Medical History  Diagnosis Date  .  Sleep apnea     "mild" never needed CPAP  . Cancer     Lung Cancer  . Hyperlipidemia     takes Atorvastatin daily  . COPD (chronic obstructive pulmonary disease)     Symbicort daily and Albuterol prn  . History of bronchitis 6 months ago  . Joint pain     Past Surgical History  Procedure Laterality Date  . Tonsilectomy 1969    . Wisdom teeth extraction 1989    . Vasectomy  2001  . Multiple extractions with alveoloplasty N/A 01/29/2015    Procedure: Extraction of tooth #'s 2,3,4,5,6,7,8,9,10,11,12,13,14,15,18,20,21,22,23, 25,26,27,28,29,30 with alveoloplasty and mandibular right lateral exostoses reductions;  Surgeon: Lenn Cal, DDS;  Location: [facility];  Service: Oral Surgery;  Laterality: N/A;  . Bronchoscopy      Family History  Problem Relation Age of Onset  . Leukemia Father       History  Smoking status  . Former Smoker  . Types: Cigarettes  . Quit date: 09/27/2013  Smokeless tobacco  . Never Used    Comment: quit smoking in 2015(cig) and cigars(01/29/15)    History    Alcohol Use  . 0.6 oz/week  . 1 Cans of beer per week    Comment: occasionally     No Known Allergies  Current Facility-Administered Medications  Medication Dose Route Frequency Provider Last Rate Last Dose  . cefUROXime (ZINACEF) 1.5 g in dextrose 5 % 50 mL IVPB  1.5 g Intravenous 60 min Pre-Op [name], MD      . dextrose 5 % with cefUROXime (ZINACEF) ADS Med            Facility-Administered Medications Ordered in Other Encounters  Medication Dose Route Frequency Provider Last Rate Last Dose  . fentaNYL (SUBLIMAZE) injection    Anesthesia Intra-op Ollen Bowl, CRNA   50 mcg at 02/17/15 [phone]  . lactated ringers infusion    Continuous PRN Rokoshi T Flowers, Immunologist      . midazolam (VERSED) 5 MG/5ML injection    Anesthesia Intra-op Ollen Bowl, CRNA   2 mg at 02/17/15 3716     Review of Systems:     Cardiac Review of Systems: Y or N  Chest Pain [  N  ]  Resting SOB [ N  ] Exertional SOB  [Y  ]  Orthopnea Aqua.Slicker  ]   Pedal Edema [ N  ]    Palpitations [ N ] Syncope  [ N ]   Presyncope [N   ]  General Review of Systems: [Y] = yes [  ]=no Constitional: recent weight change [ N ];  Wt loss over the last 3 months [   ] anorexia [  ]; fatigue [  ]; nausea [  ]; night sweats Aqua.Slicker  ]; fever [  ]; or chills [  ];          Dental: poor dentition[] ; Last Dentist visit: this mourning   Eye : blurred vision Aqua.Slicker  ]; diplopia [   ]; vision changes [  ];  Amaurosis fugax[  ]; Resp: cough [Y  ];  wheezing[N  ];  hemoptysis[ N ]; shortness of breath[ N ]; paroxysmal nocturnal dyspnea[  ]; dyspnea on exertion[  ]; or orthopnea[  ];  GI:  gallstones[  ], vomiting[  ];  dysphagia[  ]; melena[  ];  hematochezia [  ]; heartburn[  ];   Hx of  Colonoscopy[  N]; GU: kidney stones [  ];  hematuria[  ];   dysuria [  ];  nocturia[  ];  history of     obstruction [ N ]; urinary frequency [N  ]             Skin: rash, swelling[  ];, hair loss[  ];  peripheral edema[  ];  or itching[   ]; Musculosketetal: myalgias[  ];  joint swelling[  ];  joint erythema[  ];  joint pain[  ];  back pain[  ];  Heme/Lymph: bruising[  ];  bleeding[  ];  anemia[  ];  Neuro: TIA[  ];  headaches[  ];  stroke[  ];  vertigo[  ];  seizures[ N ];   paresthesias[  ];  difficulty walking[ N ];  Psych:depression[  ]; anxiety[  ];  Endocrine: diabetes[N  ];  thyroid dysfunction[ N ];  Immunizations: Flu up to date Aqua.Slicker  ]; Pneumococcal up to date [facility]  ];  Other:  Physical Exam: BP 129/78 mmHg  Pulse 67  Temp(Src) 97.7 F (36.5 C) (Oral)  Resp 20  Wt 204 lb (92.534 kg)  SpO2 99%  PHYSICAL EXAMINATION: General appearance: alert, cooperative, appears stated age and no distress Head: Normocephalic, without obvious abnormality, atraumatic, sites of dental extractions  healed  Neck: no adenopathy, no carotid bruit, no JVD, supple, symmetrical, trachea midline and thyroid not enlarged, symmetric, no tenderness/mass/nodules Lymph nodes: Cervical, supraclavicular, and axillary nodes normal. Resp: clear to auscultation bilaterally Back: symmetric, no curvature. ROM normal. No CVA tenderness. Cardio: regular rate and rhythm, S1, S2 normal, no murmur, click, rub or gallop, History of heart murmur, but I do not hear one GI: soft, non-tender; bowel sounds normal; no masses,  no organomegaly Extremities: extremities normal, atraumatic, no cyanosis or edema Neurologic: Grossly normal palpable dp and pt pulses Mouth appears healing well.   Diagnostic Studies & Laboratory data:     Recent Radiology Findings:   Dg Chest 2 View  02/17/2015   CLINICAL DATA:  Left lung mass .  EXAM: CHEST  2 VIEW  COMPARISON:  11/27/2014  FINDINGS: The left upper lobe postobstructive atelectasis has resolved. There continue to be prominent upper hilar contours on the left. The lungs are clear. There are no pleural effusions. Heart size is normal.  IMPRESSION: Resolved postobstructive left upper lobe atelectasis. Prominent left  hilar contours.   Electronically Signed   By: Andreas Newport M.D.   On: 02/17/2015 06:15    CLINICAL DATA: Left lung infiltrate.  EXAM: CT CHEST WITH CONTRAST  TECHNIQUE: Multidetector CT imaging of the chest was performed during intravenous contrast administration.  CONTRAST: 75 mL of Omnipaque 350 intravenously.  COMPARISON: None.  FINDINGS: No pneumothorax or pleural effusion is noted. There is no evidence of thoracic aortic dissection or aneurysm. Visualized portion of upper abdomen appears normal. No significant osseous abnormality is noted.  4.4 x 2.8 cm subcutaneous cyst is seen in right breast region. There is postobstructive atelectasis of the left upper lobe secondary to enhancing probable endobronchial mass at the origin of the left upper lobe bronchus which measures 1.8 x 1.5 x 1.2 cm. No other significant mediastinal mass or adenopathy is noted.  IMPRESSION: Postobstructive atelectasis of left upper lobe secondary to 1.8 cm probable endobronchial mass at the origin of the left upper lobe bronchus. This is highly concerning for neoplasm or malignancy. Bronchoscopy is recommended for further evaluation. These results will be called to the ordering clinician or representative by the Radiologist Assistant, and communication documented in  the PACS or zVision Dashboard.   Electronically Signed  By: Marijo Conception, M.D.  On: 12/20/2014 09:56  I have independently reviewed the above radiology studies  and reviewed the findings with the patient.    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  I have discussed with the patient the need for resection , without will result in continued obstruction of left upper lobe and potentially lower lobe.  Patient has had his questions answered and is will to proceed with resection. The goals risks and alternatives of the planned surgical procedure Bronchoscopy, left thoracotomy , Left lung resection poss sleeve resection  have been discussed with the patient in detail. The risks of the procedure including death, infection, stroke, myocardial infarction, bleeding, blood transfusion have all been discussed specifically.  I have quoted [name] a 5 % of perioperative mortality and a complication rate as high as 30 %. The patient's questions have been answered.ITHIEL LIEBLER is willing  to proceed with the planned procedure.   [name] MD      [address] [place] [address] Office [phone]   Beeper [phone]  02/17/2015 7:18 AM

## 2015-02-17 NOTE — Transfer of Care (Signed)
Immediate Anesthesia Transfer of Care Note  Patient: Timothy Benitez  Procedure(s) Performed: Procedure(s): VIDEO BRONCHOSCOPY (N/A) THORACOTOMY WITH LEFT UPPER LOBECTOMY WITH LYMPH NODE DISSECTION, OPEN CLOSURE OF LEFT UPPER LOBE BRONCHUS, INTERCOSTAL MUSCLE FLAP COVERAGE OF LEFT UPPER LOBE BRONCHIAL STUMP, PLACEMENT OF ON-Q (Left)  Patient Location: PACU  Anesthesia Type:General  Level of Consciousness: awake and alert   Airway & Oxygen Therapy: Patient Spontanous Breathing and Patient connected to face mask oxygen  Post-op Assessment: Report given to RN, Post -op Vital signs reviewed and stable and Patient moving all extremities X 4  Post vital signs: Reviewed and stable  Last Vitals:  Filed Vitals:   02/17/15 0552  BP: 129/78  Pulse: 67  Temp: 36.5 C  Resp: 20    Complications: No apparent anesthesia complications

## 2015-02-17 NOTE — Brief Op Note (Addendum)
      [address]       [place] [address]             [phone]      02/17/2015  3:34 PM  PATIENT:  Timothy Benitez  54 y.o. male  PRE-OPERATIVE DIAGNOSIS:  Left lung Mass- Carcinoid  POST-OPERATIVE DIAGNOSIS: same   PROCEDURE: VIDEO BRONCHOSCOPY, LEFT  THORACOTOMY WITH LEFT UPPER LOBECTOMY WITH LYMPH NODE DISSECTION, OPEN CLOSURE OF LEFT UPPER LOBE BRONCHUS, INTERCOSTAL MUSCLE FLAP COVERAGE OF LEFT UPPER LOBE BRONCHIAL STUMP, PLACEMENT OF ON-Q (Left)  SURGEON:  Surgeon(s) and Role:    * [name], MD - Primary  PHYSICIAN ASSISTANT: Lars Pinks PA-C  ANESTHESIA:   general  EBL:  Total I/O In: 3794 [I.V.:3200] Out: 1250 [Urine:1000; Blood:250]  BLOOD ADMINISTERED:none  DRAINS: 2 65 Blake drains placed in the left pleural space   LOCAL MEDICATIONS USED:  MARCAINE     SPECIMEN:  Source of Specimen:  LUL, second bronchial margin margin negative for carcinoid   DISPOSITION OF SPECIMEN:  PATHOLOGY  COUNTS CORRECT:  YES  DICTATION: .Dragon Dictation  PLAN OF CARE: Admit to inpatient   PATIENT DISPOSITION:  PACU - hemodynamically stable.   Delay start of Pharmacological VTE agent (>24hrs) due to surgical blood loss or risk of bleeding: yes

## 2015-02-18 ENCOUNTER — Encounter (HOSPITAL_COMMUNITY): Payer: Self-pay | Admitting: Cardiothoracic Surgery

## 2015-02-18 ENCOUNTER — Inpatient Hospital Stay (HOSPITAL_COMMUNITY): Payer: No Typology Code available for payment source

## 2015-02-18 LAB — BLOOD GAS, ARTERIAL
Acid-Base Excess: 1.9 mmol/L (ref 0.0–2.0)
BICARBONATE: 26.5 meq/L — AB (ref 20.0–24.0)
Drawn by: 364961
FIO2: 0.24 %
O2 Saturation: 96.8 %
PATIENT TEMPERATURE: 98.1
PH ART: 7.39 (ref 7.350–7.450)
PO2 ART: 80.2 mmHg (ref 80.0–100.0)
TCO2: 27.9 mmol/L (ref 0–100)
pCO2 arterial: 44.6 mmHg (ref 35.0–45.0)

## 2015-02-18 LAB — CBC
HCT: 36 % — ABNORMAL LOW (ref 39.0–52.0)
Hemoglobin: 12.4 g/dL — ABNORMAL LOW (ref 13.0–17.0)
MCH: 28.6 pg (ref 26.0–34.0)
MCHC: 34.4 g/dL (ref 30.0–36.0)
MCV: 83.1 fL (ref 78.0–100.0)
Platelets: 175 10*3/uL (ref 150–400)
RBC: 4.33 MIL/uL (ref 4.22–5.81)
RDW: 13.3 % (ref 11.5–15.5)
WBC: 13.3 10*3/uL — AB (ref 4.0–10.5)

## 2015-02-18 LAB — BASIC METABOLIC PANEL
ANION GAP: 5 (ref 5–15)
BUN: 13 mg/dL (ref 6–20)
CHLORIDE: 104 mmol/L (ref 101–111)
CO2: 27 mmol/L (ref 22–32)
Calcium: 8.2 mg/dL — ABNORMAL LOW (ref 8.9–10.3)
Creatinine, Ser: 0.84 mg/dL (ref 0.61–1.24)
GFR calc Af Amer: >60 mL/min (ref >60)
GFR calc non Af Amer: >60 mL/min (ref >60)
GLUCOSE: 138 mg/dL — AB (ref 65–99)
POTASSIUM: 4.3 mmol/L (ref 3.5–5.1)
SODIUM: 136 mmol/L (ref 135–145)

## 2015-02-18 LAB — GLUCOSE, CAPILLARY
Glucose-Capillary: 126 mg/dL — ABNORMAL HIGH (ref 65–99)
Glucose-Capillary: 109 mg/dL — ABNORMAL HIGH (ref 65–99)
Glucose-Capillary: 116 mg/dL — ABNORMAL HIGH (ref 65–99)
Glucose-Capillary: 120 mg/dL — ABNORMAL HIGH (ref 65–99)
Glucose-Capillary: 99 mg/dL (ref 65–99)

## 2015-02-18 MED ORDER — ENSURE ENLIVE PO LIQD
237.0000 mL | Freq: Two times a day (BID) | ORAL | Status: DC
  Administered 2015-02-18 – 2015-02-21 (×5): 237 mL via ORAL

## 2015-02-18 MED ORDER — ENOXAPARIN SODIUM 30 MG/0.3ML ~~LOC~~ SOLN
30.0000 mg | SUBCUTANEOUS | Status: DC
  Administered 2015-02-18 – 2015-02-20 (×3): 30 mg via SUBCUTANEOUS
  Filled 2015-02-18 (×4): qty 0.3

## 2015-02-18 MED ORDER — KETOROLAC TROMETHAMINE 15 MG/ML IJ SOLN
15.0000 mg | Freq: Three times a day (TID) | INTRAMUSCULAR | Status: AC
  Administered 2015-02-18 – 2015-02-20 (×9): 15 mg via INTRAVENOUS
  Filled 2015-02-18 (×9): qty 1

## 2015-02-18 NOTE — Progress Notes (Signed)
Patient ID: Timothy Benitez, male   DOB: 26-Sep-1960, 54 y.o.   MRN: [id number] TCTS DAILY ICU PROGRESS NOTE                   [address]            [place] [address]          [phone]   1 Day Post-Op Procedure(s) (LRB): VIDEO BRONCHOSCOPY (N/A) THORACOTOMY WITH LEFT UPPER LOBECTOMY WITH LYMPH NODE DISSECTION, OPEN CLOSURE OF LEFT UPPER LOBE BRONCHUS, INTERCOSTAL MUSCLE FLAP COVERAGE OF LEFT UPPER LOBE BRONCHIAL STUMP, PLACEMENT OF ON-Q (Left)  Total Length of Stay:  LOS: 1 day   Subjective: Feels well  Objective: Vital signs in last 24 hours: Temp:  [97.9 F (36.6 C)-98.5 F (36.9 C)] 98.5 F (36.9 C) (06/28 0400) Pulse Rate:  [59-85] 59 (06/28 0731) Cardiac Rhythm:  [-] Normal sinus rhythm (06/27 2000) Resp:  [10-23] 19 (06/28 0750) BP: (80-126)/(41-69) 93/47 mmHg (06/28 0731) SpO2:  [96 %-100 %] 98 % (06/28 0750) Arterial Line BP: (98-148)/(40-61) 114/42 mmHg (06/28 0600) Weight:  [202 lb 2.6 oz (91.7 kg)] 202 lb 2.6 oz (91.7 kg) (06/28 0600)  Filed Weights   02/17/15 0552 02/18/15 0600  Weight: 204 lb (92.534 kg) 202 lb 2.6 oz (91.7 kg)    Weight change: -1 lb 13.4 oz (-0.834 kg)   Hemodynamic parameters for last 24 hours:    Intake/Output from previous day: 06/27 0701 - 06/28 0700 In: 4596.7 [I.V.:4546.7; IV Piggyback:50] Out: 8527 [Urine:3250; Blood:250; Chest Tube:350]  Intake/Output this shift:    Current Meds: Scheduled Meds: . acetaminophen  1,000 mg Oral 4 times per day   Or  . acetaminophen (TYLENOL) oral liquid 160 mg/5 mL  1,000 mg Oral 4 times per day  . albuterol  2.5 mg Nebulization Q4H while awake  . antiseptic oral rinse  7 mL Mouth Rinse BID  . atorvastatin  20 mg Oral QPM  . bisacodyl  10 mg Oral Daily  . budesonide-formoterol  2 puff Inhalation BID  . cefUROXime (ZINACEF)  IV  1.5 g Intravenous Q12H  . fentaNYL   Intravenous 6 times per day  . insulin aspart  0-24 Units Subcutaneous 4 times per day  .  pantoprazole  40 mg Oral Daily  . pneumococcal 23 valent vaccine  0.5 mL Intramuscular Tomorrow-1000  . senna-docusate  1 tablet Oral QHS   Continuous Infusions: . dextrose 5 % and 0.9 % NaCl with KCl 20 mEq/L 100 mL/hr at 02/18/15 0700   PRN Meds:.diphenhydrAMINE **OR** diphenhydrAMINE, naloxone **AND** sodium chloride, ondansetron (ZOFRAN) IV, oxyCODONE, potassium chloride, traMADol  General appearance: alert and cooperative Neurologic: intact Heart: regular rate and rhythm, S1, S2 normal, no murmur, click, rub or gallop Lungs: clear to auscultation bilaterally Abdomen: soft, non-tender; bowel sounds normal; no masses,  no organomegaly Extremities: extremities normal, atraumatic, no cyanosis or edema and Homans sign is negative, no sign of DVT Wound: no air leak from chest tubes  Lab Results: CBC: Recent Labs  02/18/15 0414  WBC 13.3*  HGB 12.4*  HCT 36.0*  PLT 175   BMET:  Recent Labs  02/18/15 0414  NA 136  K 4.3  CL 104  CO2 27  GLUCOSE 138*  BUN 13  CREATININE 0.84  CALCIUM 8.2*    PT/INR: No results for input(s): LABPROT, INR in the last 72 hours. Radiology: Dg Chest Port 1 View  02/18/2015   CLINICAL DATA:  Thoracotomy.  Pneumothorax.  EXAM: PORTABLE CHEST - 1 VIEW  COMPARISON:  02/17/2015  FINDINGS: Two left chest tubes appear unchanged in position. The right jugular central line extends into the SVC. No pneumothorax is evident. There is mild linear scarring or atelectasis in the bases, unchanged.  IMPRESSION: Support equipment appears satisfactorily positioned. No pneumothorax. Minimal linear basilar opacities.   Electronically Signed   By: Andreas Newport M.D.   On: 02/18/2015 07:07   Dg Chest Port 1 View  02/17/2015   CLINICAL DATA:  Status post left upper lobectomy and lymph node dissection today.  EXAM: PORTABLE CHEST - 1 VIEW  COMPARISON:  PA and lateral chest 11/27/2014.  CT chest 12/20/2014.  FINDINGS: Right IJ catheter is in place with the tip  projecting over the mid to lower superior vena cava. Two left chest tubes are identified. No pneumothorax is seen. Subcutaneous air along the left chest wall is noted. Mild right basilar atelectasis is seen.  IMPRESSION: Right IJ catheter projects in good position.  Two left chest tubes in place.  Negative for pneumothorax.  Small right pleural effusion.   Electronically Signed   By: Inge Rise M.D.   On: 02/17/2015 16:23     Assessment/Plan: S/P Procedure(s) (LRB): VIDEO BRONCHOSCOPY (N/A) THORACOTOMY WITH LEFT UPPER LOBECTOMY WITH LYMPH NODE DISSECTION, OPEN CLOSURE OF LEFT UPPER LOBE BRONCHUS, INTERCOSTAL MUSCLE FLAP COVERAGE OF LEFT UPPER LOBE BRONCHIAL STUMP, PLACEMENT OF ON-Q (Left) See progression orders Stable  Chest tube to water seal     [name] 02/18/2015 8:07 AM

## 2015-02-18 NOTE — Progress Notes (Signed)
Pt fentanyl PCA syringe empty, replaced by this RN @ 2306, Carlus Pavlov RN witnessed. Will continue to monitor.

## 2015-02-18 NOTE — Progress Notes (Deleted)
Nutrition Brief Note  Patient identified on the Malnutrition Screening Tool (MST) Report.  Patient reports no recent weight loss.  Eating well PTA.  Wt Readings from Last 15 Encounters:  02/18/15 202 lb 2.6 oz (91.7 kg)  02/13/15 204 lb (92.534 kg)  02/10/15 205 lb (92.987 kg)  01/17/15 207 lb (93.895 kg)  01/09/15 205 lb (92.987 kg)  01/02/15 210 lb (95.255 kg)  12/26/14 208 lb 5.4 oz (94.5 kg)    Body mass index is 28.21 kg/(m^2). Patient meets criteria for Overweight based on current BMI.   Current diet order is Full Liquids. Labs and medications reviewed.   No nutrition interventions warranted at this time. If nutrition issues arise, please consult RD.   Arthur Holms, RD, LDN Pager #: [phone] After-Hours Pager #: [phone]

## 2015-02-18 NOTE — Progress Notes (Signed)
UR completed.    Makalya Nave W. Avanni Turnbaugh, RN, BSN  Trauma/Neuro ICU Case Manager [phone] 

## 2015-02-18 NOTE — Significant Event (Signed)
Multiple attempts to assist patient to void. Patient voided only a few drops-patient states he feels like he needs to go but cannot void. Bladder scan done, showed >999cc in bladder. Spoke with Dr. Servando Snare during evening round. MD gave verbal order that if patient cannot void, staff can reinsert foley catheter. Timothy Benitez, Therapist, sports.

## 2015-02-18 NOTE — Progress Notes (Signed)
Initial Nutrition Assessment  DOCUMENTATION CODES:  Not applicable  INTERVENTION:  Ensure Enlive (each supplement provides 350kcal and 20 grams of protein)  NUTRITION DIAGNOSIS:  Increased nutrient needs related to  (post-op healing) as evidenced by estimated needs  GOAL:  Patient will meet greater than or equal to 90% of their needs  MONITOR:  PO intake, Supplement acceptance, Labs, Weight trends, I & O's  REASON FOR ASSESSMENT:  Malnutrition Screening Tool  ASSESSMENT: 54 y.o. Male seen in the office last week for recent diagnosis of carcinoid of the left upper lobe. He was seen by primary carefor evaluation of a cough. cause A chest x-ray was obtained. The chest x-ray was abnormal which led to a CT scan. The CT scan showed a left upper lobe mass with atelectasis of the left upper lobe.  Patient s/p procedures: VIDEO BRONCHOSCOPY LEFT THORACOTOMY WITH LEFT UPPER LOBECTOMY WITH LYMPH NODE DISSECTION OPEN CLOSURE OF LEFT UPPER LOBE BRONCHUS INTERCOSTAL MUSCLE FLAP COVERAGE OF LEFT UPPER LOBE BRONCHIAL STUMP PLACEMENT OF ON-Q (Left)  Patient sleeping upon RD visit.  Did not wake.  Per Malnutrition Screening Tool Report, pt has lost 15-20 lbs in the last month.  Unable to assess usual dietary intake PTA.  Increased nutrient needs given post-op state.  Would benefit from addition or oral nutrition supplements.  RD to order.  RD unable to complete Nutrition Focused Physical Exam at this time.  Height:  Ht Readings from Last 1 Encounters:  02/13/15 '5\' 11"'$  (1.803 m)    Weight:  Wt Readings from Last 1 Encounters:  02/18/15 202 lb 2.6 oz (91.7 kg)    Ideal Body Weight:  78.1 kg  Wt Readings from Last 10 Encounters:  02/18/15 202 lb 2.6 oz (91.7 kg)  02/13/15 204 lb (92.534 kg)  02/10/15 205 lb (92.987 kg)  01/17/15 207 lb (93.895 kg)  01/09/15 205 lb (92.987 kg)  01/02/15 210 lb (95.255 kg)  12/26/14 208 lb 5.4 oz (94.5 kg)    BMI:  Body mass index is 28.21  kg/(m^2).  Estimated Nutritional Needs:  Kcal:  2200-2400  Protein:  110-120 gm  Fluid:  2.2-2.4 L  Skin:  Reviewed, no issues  Diet Order:  Diet full liquid Room service appropriate?: Yes; Fluid consistency:: Thin  EDUCATION NEEDS:  No education needs identified at this time   Intake/Output Summary (Last 24 hours) at 02/18/15 1529 Last data filed at 02/18/15 1500  Gross per 24 hour  Intake 3176.67 ml  Output   2725 ml  Net 451.67 ml    Last BM:  6/26  Arthur Holms, RD, LDN Pager #: [phone] After-Hours Pager #: [phone]

## 2015-02-18 NOTE — Progress Notes (Signed)
Patient ID: Timothy Benitez, male   DOB: July 03, 1961, 54 y.o.   MRN: [id number] EVENING ROUNDS NOTE :     [address]       [place] [address]             [phone]                 1 Day Post-Op Procedure(s) (LRB): VIDEO BRONCHOSCOPY (N/A) THORACOTOMY WITH LEFT UPPER LOBECTOMY WITH LYMPH NODE DISSECTION, OPEN CLOSURE OF LEFT UPPER LOBE BRONCHUS, INTERCOSTAL MUSCLE FLAP COVERAGE OF LEFT UPPER LOBE BRONCHIAL STUMP, PLACEMENT OF ON-Q (Left)  Total Length of Stay:  LOS: 1 day  BP 90/43 mmHg  Pulse 58  Temp(Src) 98 F (36.7 C) (Oral)  Resp 15  Wt 202 lb 2.6 oz (91.7 kg)  SpO2 96%  .Intake/Output      06/27 0701 - 06/28 0700 06/28 0701 - 06/29 0700   P.O.  360   I.V. (mL/kg) 4546.7 (49.6) 200 (2.2)   IV Piggyback 50 50   Total Intake(mL/kg) 4596.7 (50.1) 610 (6.7)   Urine (mL/kg/hr) 3250 (1.5) 125 (0.1)   Blood 250 (0.1)    Chest Tube 350 (0.2) 100 (0.1)   Total Output 3850 225   Net +746.7 +385          . dextrose 5 % and 0.9 % NaCl with KCl 20 mEq/L 10 mL/hr at 02/18/15 1500     Lab Results  Component Value Date   WBC 13.3* 02/18/2015   HGB 12.4* 02/18/2015   HCT 36.0* 02/18/2015   PLT 175 02/18/2015   GLUCOSE 138* 02/18/2015   ALT 34 02/13/2015   AST 25 02/13/2015   NA 136 02/18/2015   K 4.3 02/18/2015   CL 104 02/18/2015   CREATININE 0.84 02/18/2015   BUN 13 02/18/2015   CO2 27 02/18/2015   INR 0.94 02/13/2015   Stable day Waiting for bed on step down No air leak from chest tube Has not voided , check bladder scan   [name] MD  Beeper [phone] Office [phone] 02/18/2015 6:52 PM

## 2015-02-19 ENCOUNTER — Inpatient Hospital Stay (HOSPITAL_COMMUNITY): Payer: No Typology Code available for payment source

## 2015-02-19 LAB — CBC
HCT: 34.3 % — ABNORMAL LOW (ref 39.0–52.0)
Hemoglobin: 11.3 g/dL — ABNORMAL LOW (ref 13.0–17.0)
MCH: 28.3 pg (ref 26.0–34.0)
MCHC: 32.9 g/dL (ref 30.0–36.0)
MCV: 86 fL (ref 78.0–100.0)
Platelets: 167 10*3/uL (ref 150–400)
RBC: 3.99 MIL/uL — ABNORMAL LOW (ref 4.22–5.81)
RDW: 13.8 % (ref 11.5–15.5)
WBC: 9.6 10*3/uL (ref 4.0–10.5)

## 2015-02-19 LAB — CULTURE, RESPIRATORY W GRAM STAIN: Gram Stain: NONE SEEN

## 2015-02-19 LAB — COMPREHENSIVE METABOLIC PANEL
ALBUMIN: 2.9 g/dL — AB (ref 3.5–5.0)
ALK PHOS: 39 U/L (ref 38–126)
ALT: 32 U/L (ref 17–63)
ANION GAP: 9 (ref 5–15)
AST: 36 U/L (ref 15–41)
BUN: 13 mg/dL (ref 6–20)
CO2: 29 mmol/L (ref 22–32)
Calcium: 8.4 mg/dL — ABNORMAL LOW (ref 8.9–10.3)
Chloride: 101 mmol/L (ref 101–111)
Creatinine, Ser: 0.86 mg/dL (ref 0.61–1.24)
GFR calc Af Amer: >60 mL/min (ref >60)
GFR calc non Af Amer: >60 mL/min (ref >60)
Glucose, Bld: 96 mg/dL (ref 65–99)
POTASSIUM: 3.9 mmol/L (ref 3.5–5.1)
Sodium: 139 mmol/L (ref 135–145)
TOTAL PROTEIN: 4.8 g/dL — AB (ref 6.5–8.1)
Total Bilirubin: 0.8 mg/dL (ref 0.3–1.2)

## 2015-02-19 LAB — GLUCOSE, CAPILLARY: Glucose-Capillary: 88 mg/dL (ref 65–99)

## 2015-02-19 NOTE — Op Note (Signed)
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  SURGICAL PROCEDURE:   bronchoscopy left video-assisted thoracoscopy, left mini thoracotomy, left upper lobectomy with open suture closure of the left upper lobe bronchial stump, placement of On-Q and lymph node dissection.  SURGEON:  [name], MD.  FIRST ASSISTANT:  Lars Pinks, PA  BRIEF HISTORY:  The patient is a 54 year old male, who presented to [facility] with increasing shortness of breath and wheezing. Chest x-ray showed a mass in the left upper lobe.  Further evaluation at [facility] including bronchoscopy, CT scan and PET scan with biopsy confirmed a left upper lobe bronchial tumor carcinoid by biopsy.  The patient was referred for consideration of surgical resection.  On the patient's preop studies, the mass was close to the takeoff of the upper lobe bronchus and potentially could interfere with surgical resection. Preoperatively, we discussed with the patient left upper lobectomy, possible sleep resection or possible pneumonectomy.  When the patient was originally seen, he had multiple loose teeth that were also infected to cut down on the risk of infection especially a new pulmonary infection and/or disruption of his teeth.  He was referred to Dr. Thomasene Lot who removed his bad teeth the week prior.  Pulmonary function studies were adequate.  The risks and options have been explained to the patient in detail and he was agreeable and signed informed  consent.  DESCRIPTION OF PROCEDURE:  With central line and arterial line in place, the patient underwent endotracheal intubation with a single-lumen endotracheal tube.  Proper time-out was performed and through this tube a fiberoptic bronchoscope was passed to the subsegmental level in both the left and right tracheobronchial tree with evidence of as noted the left upper lobe bronchial mass.  No other endobronchial lesions were noted.  Photographs of this were placed into the EPIC chart.  Anesthesia then proceeded to attempt to place a right double-lumen endotracheal tube after significant difficulty in getting it positioned properly.  We placed a left double-lumen endotracheal tube knowing that we may have to reposition this, this worked satisfactorily.  The patient was then turned in lateral decubitus position.  The left chest had previously been marked was prepped with Betadine and draped in sterile manner.  A second time-out was performed.  We then proceeded with collapse of the left lung and making a small incision in the fifth intercostal space.  A video scope was introduced into the chest.  There were no pleural lesions appreciated.  There were adhesions of the left upper lobe to the mediastinum knowing that this would possibly sleeve resection.  We proceeded with enlarging incision to a mini thoracotomy and proceeded with resection first taking down adhesions of the left upper lobe.  We then opened up the mediastinum posteriorly.  The fissure was then dissected along the fissure and identified the upper lobe bronchial arteries coming off the pulmonary artery.  Each of these was divided with a vascular stapler including the lingular branch.  We then proceeded with identifying the upper lobe pulmonary vein which was then also divided with a vascular stapler.  This freed the mediastinum.  We had previously divided the inferior pulmonary ligament to allow good movement of the lower  lobe.  We then from the preoperative bronchoscopy decided to not to try the staple across the upper lobe bronchus but divided this in an open manner.  The left upper lobe was then removed and the specimen was easily identified at the upper lobe bronchus though it did not appear that we had cut into it and had excised it completely. We then took an additional rim of the left upper lobe bronchial stump and sent in the cut margin and submitted to Pathology as the final margin.  We then proceeded to close the bronchial stump with interrupted 4-0 PDS sutures.  At the beginning of the procedure as we opened the chest, an intercostal muscle of the fifth intercostal space had been preserved to use as a closure over the bronchial stump.  This was brought over the bronchial stump and tacked in place after Progel was placed.  Prior to this, we had inflated the lung and tested the bronchial stump for any air leak and there was none and holding pressure at 30 mm.  With this satisfactorily completed, the lower lobe was inflated.  We then dissected out mediastinal lymph nodes submitting them separately in cups. The On-Q device was tunneled subpleurally.  The lung was inflated.  Two Blake drains were left in place.  The ribs were reapproximated.  The muscle layers were then closed with interrupted 0 Vicryl, running 2-0 Vicryl in subcutaneous tissue, 3-0 subcuticular stitch in skin edges.  Dry dressings were applied.  The patient was awakened and extubated in operating room tolerating the procedure without obvious complication.  The estimated blood loss was 250-300 mL. The patient tolerated the procedure without obvious complication and was transferred to the Surgical Intensive Care Unit for further postoperative care.     [name], MD     EG/MEDQ  D:  02/19/2015  T:  02/19/2015  Job:  [id number]  cc:   Nestor Lewandowsky, MD

## 2015-02-19 NOTE — Progress Notes (Signed)
Pt ambulated with nurse 550 ft with no assistance. Cato Mulligan RN

## 2015-02-19 NOTE — Progress Notes (Addendum)
[facility] DAILY ICU PROGRESS NOTE                   [address]            [place] [address]          [phone]   2 Days Post-Op Procedure(s) (LRB): VIDEO BRONCHOSCOPY (N/A) THORACOTOMY WITH LEFT UPPER LOBECTOMY WITH LYMPH NODE DISSECTION, OPEN CLOSURE OF LEFT UPPER LOBE BRONCHUS, INTERCOSTAL MUSCLE FLAP COVERAGE OF LEFT UPPER LOBE BRONCHIAL STUMP, PLACEMENT OF ON-Q (Left)  Total Length of Stay:  LOS: 2 days   Subjective: Urinary retention overnight requiring reinsertion of Foley. Otherwise stable.   Objective: Vital signs in last 24 hours: Temp:  [97.5 F (36.4 C)-98.2 F (36.8 C)] 98.2 F (36.8 C) (06/29 0812) Pulse Rate:  [50-90] 57 (06/29 0800) Cardiac Rhythm:  [-] Sinus bradycardia (06/29 0800) Resp:  [10-26] 15 (06/29 0800) BP: (79-116)/(40-65) 110/65 mmHg (06/29 0800) SpO2:  [93 %-100 %] 95 % (06/29 0857)  Filed Weights   02/17/15 0552 02/18/15 0600  Weight: 204 lb (92.534 kg) 202 lb 2.6 oz (91.7 kg)    Weight change:    Hemodynamic parameters for last 24 hours:    Intake/Output from previous day: 06/28 0701 - 06/29 0700 In: 730 [P.O.:360; I.V.:320; IV Piggyback:50] Out: 3710 [Urine:2860; Chest Tube:370]  Intake/Output this shift: Total I/O In: 10 [I.V.:10] Out: 60 [Urine:60]  Current Meds: Scheduled Meds: . acetaminophen  1,000 mg Oral 4 times per day   Or  . acetaminophen (TYLENOL) oral liquid 160 mg/5 mL  1,000 mg Oral 4 times per day  . antiseptic oral rinse  7 mL Mouth Rinse BID  . atorvastatin  20 mg Oral QPM  . bisacodyl  10 mg Oral Daily  . budesonide-formoterol  2 puff Inhalation BID  . enoxaparin (LOVENOX) injection  30 mg Subcutaneous Q24H  . feeding supplement (ENSURE ENLIVE)  237 mL Oral BID BM  . fentaNYL   Intravenous 6 times per day  . insulin aspart  0-24 Units Subcutaneous 4 times per day  . ketorolac  15 mg Intravenous 3 times per day  . pantoprazole  40 mg Oral Daily  . pneumococcal 23 valent vaccine  0.5 mL  Intramuscular Tomorrow-1000  . senna-docusate  1 tablet Oral QHS   Continuous Infusions: . dextrose 5 % and 0.9 % NaCl with KCl 20 mEq/L 10 mL/hr at 02/18/15 1500   PRN Meds:.diphenhydrAMINE **OR** diphenhydrAMINE, naloxone **AND** sodium chloride, ondansetron (ZOFRAN) IV, oxyCODONE, potassium chloride, traMADol   Physical Exam: General appearance: alert, cooperative and no distress Heart: regular rate and rhythm Lungs: diminished breath sounds bibasilar Wound: Dressed and dry    Lab Results: CBC: Recent Labs  02/18/15 0414 02/19/15 0445  WBC 13.3* 9.6  HGB 12.4* 11.3*  HCT 36.0* 34.3*  PLT 175 167   BMET:  Recent Labs  02/18/15 0414 02/19/15 0445  NA 136 139  K 4.3 3.9  CL 104 101  CO2 27 29  GLUCOSE 138* 96  BUN 13 13  CREATININE 0.84 0.86  CALCIUM 8.2* 8.4*    PT/INR: No results for input(s): LABPROT, INR in the last 72 hours. Radiology: Dg Chest Port 1 View  02/19/2015   CLINICAL DATA:  Chest tube.  EXAM: PORTABLE CHEST - 1 VIEW  COMPARISON:  Left upper lobectomy 2 days ago.  FINDINGS: The heart size is normal. Two left-sided chest tubes are in place. Previously-seen subcutaneous gas has resolved. A right IJ line is  stable. Minimal right basilar atelectasis is improved.  IMPRESSION: 1. Improved aeration of both lungs. 2. Left-sided chest tubes without pneumothorax. 3. Improved left subcutaneous gas.   Electronically Signed   By: San Morelle M.D.   On: 02/19/2015 07:51     Assessment/Plan: S/P Procedure(s) (LRB): VIDEO BRONCHOSCOPY (N/A) THORACOTOMY WITH LEFT UPPER LOBECTOMY WITH LYMPH NODE DISSECTION, OPEN CLOSURE OF LEFT UPPER LOBE BRONCHUS, INTERCOSTAL MUSCLE FLAP COVERAGE OF LEFT UPPER LOBE BRONCHIAL STUMP, PLACEMENT OF ON-Q (Left)  CXR stable, no ptx. Will d/c anterior CT, continue posterior CT to water seal.  Urinary retention- Foley replaced overnight, will continue Foley for today. Consider Flomax if issues persist.  Continue pulm toilet,  Symbicort. Wean O2 as able.  Ambulate in halls, will d/c CBGs as pt not diabetic.  Transfer PTCU.   COLLINS,GINA H 02/19/2015 9:15 AM  I have seen and examined [name] and agree with the above assessment  and plan.  [name] MD Beeper [phone] Office [phone] 02/19/2015 4:13 PM

## 2015-02-20 ENCOUNTER — Inpatient Hospital Stay (HOSPITAL_COMMUNITY): Payer: No Typology Code available for payment source

## 2015-02-20 LAB — CBC
HCT: 35.1 % — ABNORMAL LOW (ref 39.0–52.0)
Hemoglobin: 11.7 g/dL — ABNORMAL LOW (ref 13.0–17.0)
MCH: 28.6 pg (ref 26.0–34.0)
MCHC: 33.3 g/dL (ref 30.0–36.0)
MCV: 85.8 fL (ref 78.0–100.0)
Platelets: 172 10*3/uL (ref 150–400)
RBC: 4.09 MIL/uL — ABNORMAL LOW (ref 4.22–5.81)
RDW: 13.6 % (ref 11.5–15.5)
WBC: 10.2 10*3/uL (ref 4.0–10.5)

## 2015-02-20 LAB — BASIC METABOLIC PANEL
Anion gap: 8 (ref 5–15)
BUN: 10 mg/dL (ref 6–20)
CO2: 30 mmol/L (ref 22–32)
Calcium: 8.5 mg/dL — ABNORMAL LOW (ref 8.9–10.3)
Chloride: 101 mmol/L (ref 101–111)
Creatinine, Ser: 0.88 mg/dL (ref 0.61–1.24)
GFR calc Af Amer: >60 mL/min (ref >60)
GFR calc non Af Amer: >60 mL/min (ref >60)
Glucose, Bld: 97 mg/dL (ref 65–99)
Potassium: 4.1 mmol/L (ref 3.5–5.1)
Sodium: 139 mmol/L (ref 135–145)

## 2015-02-20 MED ORDER — SODIUM CHLORIDE 0.9 % IJ SOLN
10.0000 mL | INTRAMUSCULAR | Status: DC | PRN
  Administered 2015-02-20: 10 mL
  Filled 2015-02-20: qty 40

## 2015-02-20 NOTE — Care Management Note (Signed)
Case Management Note  Patient Details  Name: CLARKE PERETZ MRN: [id number] Date of Birth: 18-Jun-1961  Subjective/Objective:     Patient sitting up in chair. Last CT dc'd.  States feels great.  Prior to admission lived at home alone and was independent, but on discharge plans to go to friends home or they will come to his house until he is up to being on his own.                 Action/Plan:   Expected Discharge Date:                  Expected Discharge Plan:  Home/Self Care  In-House Referral:     Discharge planning Services     Post Acute Care Choice:    Choice offered to:     DME Arranged:    DME Agency:     HH Arranged:    HH Agency:     Status of Service:  In process, will continue to follow  Medicare Important Message Given:    Date Medicare IM Given:    Medicare IM give by:    Date Additional Medicare IM Given:    Additional Medicare Important Message give by:     If discussed at [place] of Stay Meetings, dates discussed:    Additional Comments:  Vergie Living, RN 02/20/2015, 1:31 PM

## 2015-02-20 NOTE — Progress Notes (Signed)
02/20/2015 1115 Wasted 8.7cc's dilaudid(D/C'D PCA)  in sink with Pricilla RN. [place]

## 2015-02-20 NOTE — Discharge Instructions (Signed)
ACTIVITY:  1.Increase activity slowly. 2.Walk daily and increase frequency and duration as tolerates. 3.May walk up steps. 4.No lifting more than ten pounds for two weeks. 5.No driving for two weeks. 6.Avoid straining. 7.STOP any activity that causes chest pain, shortness of breath, dizziness,sweating,     or excessive weakness. 8.Continue with breathing exercises daily.  DIET:  Low fat, Low cholesterol diet   WOUND:  1.May shower. 2.Clean wounds with mild soap and water.  Call the office at [phone] if any  problems arise.  Thoracotomy, Care After Refer to this sheet in the next few weeks. These instructions provide you with information on caring for yourself after your procedure. Your health care provider may also give you more specific instructions. Your treatment has been planned according to current medical practices, but problems sometimes occur. Call your health care provider if you have any problems or questions after your procedure. WHAT TO EXPECT AFTER YOUR PROCEDURE After your procedure, it is typical to have the following sensations:  You may feel pain at the incision site.  You may be constipated from the pain medicine given and the change in your level of activity.  You may feel extremely tired. HOME CARE INSTRUCTIONS  Take over-the-counter or prescription medicines for pain, discomfort, or fever only as directed by your health care provider. It is very important to take pain relieving medicine before your pain becomes severe. You will be able to breathe and cough more comfortably if your pain is well controlled.  Take deep breaths. Deep breathing helps to keep your lungs inflated and protects against a lung infection (pneumonia).  Cough frequently. Even though coughing may cause discomfort, coughing is important to clear mucus (phlegm) and expand your lungs. Coughing helps prevent pneumonia. If it hurts to cough, hold a pillow against your chest when you cough.  This may help with the discomfort.  Continue to use an incentive spirometer as directed. The use of an incentive spirometer helps to keep your lungs inflated and protects against pneumonia.  Change the bandages over your incision as needed or as directed by your health care provider.  Remove the bandages over your chest tube site as directed by your health care provider.  Resume your normal diet as directed. It is important to have adequate protein, calories, vitamins, and minerals to promote healing.  Prevent constipation.  Eat high-fiber foods such as whole grain cereals and breads, brown rice, beans, and fresh fruits and vegetables.  Drink enough water and fluids to keep your urine clear or pale yellow. Avoid drinking beverages containing caffeine. Beverages containing caffeine can cause dehydration and harden your stool.  Talk to your health care provider about taking a stool softener or laxative.  Avoid lifting until you are instructed otherwise.  Do not drive until directed by your health care provider.  Do not drive while taking pain medicines (narcotics).  Do not bathe, swim, or use a hot tub until directed by your health care provider. You may shower instead. Gently wash the area of your incision with water and soap as directed. Do not use anything else to clean your incision except as directed by your health care provider.  Do not use any tobacco products including cigarettes, chewing tobacco, or electronic cigarettes.  Avoid secondhand smoke.  Schedule an appointment for stitch (suture) or staple removal as directed.  Schedule and attend all follow-up visits as directed by your health care provider. It is important to keep all your appointments.  Participate in pulmonary  rehabilitation as directed by your health care provider.  Do not travel by airplane for 2 weeks after your chest tube is removed. SEEK MEDICAL CARE IF:  You are bleeding from your wounds.  Your  heartbeat seems irregular.  You have redness, swelling, or increasing pain in the wounds.  There is pus coming from your wounds.  There is a bad smell coming from the wound or dressing.  You have a fever or chills.  You have nausea or are vomiting.  You have muscle aches. SEEK IMMEDIATE MEDICAL CARE IF:  You have a rash.  You have difficulty breathing.  You have a reaction or side effect to medicines given.  You have persistent nausea.  You have lightheadedness or feel faint.  You have shortness of breath or chest pain.  You have persistent pain. Document Released: 01/22/2011 Document Revised: 08/14/2013 Document Reviewed: 03/28/2013 [facility] Patient Information 2015 [place], [place]. This information is not intended to replace advice given to you by your health care provider. Make sure you discuss any questions you have with your health care provider.

## 2015-02-20 NOTE — Discharge Summary (Signed)
Physician Discharge Summary       [address]       [place] [address]             [phone]    Patient ID: Timothy Benitez MRN: [id number] DOB/AGE: [birth year] 54 y.o.  Admit date: 02/17/2015 Discharge date: 02/21/2015  Admission Diagnoses: 1. Left lung Carcinoid  2. History of OSA 3. History of hyperlipidemia 4. History of COPD 5. History of tobacco abuse  Discharge Diagnoses:  1. Left lung Carcinoid  2. History of OSA 3. History of hyperlipidemia 4. History of COPD 5. History of tobacco abuse  Procedure (s):  ER bronchoscopy left video-assisted thoracoscopy, left mini thoracotomy, left upper lobectomy with open suture closure of the left upper lobe bronchial stump, placement of On-Q and lymph node dissection by Dr. Servando Snare on 02/17/2015.  Pathology: 1. Lung, resection (segmental or lobe), left upper lobe - CARCINOID TUMOR (LOW GRADE NEUROENDOCRINE TUMOR), 1.8 CM. - THE FINAL BRONCHIAL MARGIN (SPECIMEN # 2) IS NEGATIVE FOR TUMOR. - SEE ONCOLOGY TABLE BELOW. 2. Bronchial margin(s), resection, Final margin left upper lobe - BENIGN BRONCHUS. - THERE IS NO EVIDENCE OF TUMOR IN 2 OF 2 LYMPH NODES (0/2). 3. Lymph node, biopsy, Level 8 - THERE IS NO EVIDENCE OF TUMOR IN 1 OF 1 LYMPH NODE (0/1) 4. Lymph node, biopsy, Level 8 #2 - THERE IS NO EVIDENCE OF TUMOR IN 1 OF 1 LYMPH NODE (0/1). 5. Lymph node, biopsy, 11 L node - THERE IS NO EVIDENCE OF TUMOR IN 1 OF 1 LYMPH NODE (0/1). 6. Lymph node, biopsy, 10 L - THERE IS NO EVIDENCE OF TUMOR IN 1 OF 1 LYMPH NODE (0/1).  TNM code: pT1a, pN0  History of Presenting Illness: This is a  54 y.o. male is seen in the office last week for recent diagnosis of carcinoid of the left upper lobe. He was seen by primary care for evaluation of a cough. cause A chest x-ray was obtained. The chest x-ray was abnormal which led to a CT scan. The CT scan showed a left upper lobe mass with atelectasis of the left upper lobe.  The patient is sent here for further evaluation. There are no associated symptoms. He denied any significant hemoptysis weight loss fever or chills. He denies any chest pain. The cough has now abated. He does have a history of smoking 1-2 packs cigarettes a day. He quit smoking a year ago but currently smokes cigars. 1-2 alcoholic drinks per week. 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   Carcinoid tumor of the left upper lobe, likely will require sleeve resection or poss pneumonectomy. I have discussed with the patient the need for resection , without will result in continued obstruction of left upper lobe and potentially lower lobe. Patient has had his questions answered and is will to proceed with resection. The goals risks and alternatives of the planned surgical procedure Bronchoscopy, left thoracotomy , Left lung resection poss sleeve resection have been discussed with the patient in detail. The risks of the procedure including death, infection, stroke, myocardial infarction, bleeding, blood transfusion have all been discussed specifically. I have quoted [name] a 5 % of perioperative mortality and a complication rate as high as 30 %. The patient's questions have been answered.Timothy Benitez is willing to proceed with the planned procedure. He underwent a bronchoscopy, left thoracotomy, LUL, and On Q  placement on 02/17/2015.  Brief Hospital Course:  He remained afebrile and hemodynamically stable. A line and foley were removed on;however, he developed urinary retention and had to have foley reinserted. It was removed early on 06/30 and he was able to void on his own. Chest tube output gradually decreased. There was no air leak. Daily chest x rays remained stable. One chest tube is removed on 06/29 and the remaining chest tube was removed 06/30. PCA and On Q were removed on 06/30 as well. He is  ambulating on room air. He is tolerating a diet and has had a bowel movement. Chest x ray this am shows no pneumothorax and subsegmental atelectasis right base.He is felt surgically stable for discharge today.   Latest Vital Signs: Blood pressure 113/62, pulse 65, temperature 98.2 F (36.8 C), temperature source Oral, resp. rate 18, weight 202 lb 2.6 oz (91.7 kg), SpO2 97 %.  Physical Exam: Cardiovascular: RRR. Pulmonary: Coarse on left and clear on right. Abdomen: Soft, non tender, bowel sounds present. Extremities: Trace lower extremity edema. Wounds: Clean and dry. No erythema or signs of infection.   Discharge Condition:Stable and discharged to home.  Recent laboratory studies:  Lab Results  Component Value Date   WBC 10.2 02/20/2015   HGB 11.7* 02/20/2015   HCT 35.1* 02/20/2015   MCV 85.8 02/20/2015   PLT 172 02/20/2015   Lab Results  Component Value Date   NA 139 02/20/2015   K 4.1 02/20/2015   CL 101 02/20/2015   CO2 30 02/20/2015   CREATININE 0.88 02/20/2015   GLUCOSE 97 02/20/2015     Diagnostic Studies: EXAM: CHEST 2 VIEW  COMPARISON: Portable chest x-ray of February 20, 2015  FINDINGS: There has been interval removal of the remaining left chest tube. There is no pneumothorax. Left there is a trace of pleural fluid blunting the left lateral and posterior costophrenic angles. There is stable subsegmental right basilar atelectasis. The heart is normal in size. The pulmonary vascularity is not engorged. The right internal jugular venous catheter has been removed. There is stable mild compression of the T9 vertebral body.  IMPRESSION: 1. No significant pleural effusion and no pneumothorax on the left following removal of the chest tube. 2. There is subsegmental atelectasis at the right lung base.   Electronically Signed  By: David Martinique M.D.  On: 02/21/2015 07:50  Discharge Medications:   Medication List    TAKE these medications         acetaminophen 500 MG tablet  Commonly known as:  TYLENOL  Take 1-2 tablets (500-1,000 mg total) by mouth every 6 (six) hours as needed for mild pain or headache.     albuterol 108 (90 BASE) MCG/ACT inhaler  Commonly known as:  PROVENTIL HFA;VENTOLIN HFA  Inhale 1-2 puffs into the lungs every 6 (six) hours as needed for wheezing or shortness of breath.     atorvastatin 20 MG tablet  Commonly known as:  LIPITOR  Take 20 mg by mouth every evening.     b complex vitamins tablet  Take 1 tablet by mouth daily.     budesonide-formoterol 160-4.5 MCG/ACT inhaler  Commonly known as:  SYMBICORT  Inhale 2 puffs into the lungs 2 (two) times daily.     chlorhexidine 0.12 % solution  Commonly known as:  PERIDEX  Rinse with 15 mls twice daily for 30 seconds. Use after breakfast and at bedtime. Spit out excess. Do not swallow.     oxyCODONE 5 MG immediate release  tablet  Commonly known as:  Oxy IR/ROXICODONE  Take 1-2 tablets (5-10 mg total) by mouth every 4 (four) hours as needed for severe pain.     Vitamin D 2000 UNITS Caps  Take 1 capsule by mouth daily.        Follow Up Appointments: Follow-up Information    Follow up with [name], MD On 03/13/2015.   Specialty:  Cardiothoracic Surgery   Why:  PA/LAT CXR to be taken (at [facility] which is in the same building as Dr. Everrett Coombe office) on 03/13/2015 at 12:30 pm ;Appointment time is at 1:15 pm   Contact information:   [place] [address] [phone]       Follow up with Nurse On 02/28/2015.   Why:  Appointment is with nurse only to have chest tube sutures removed. Appointment time is at 9:30 am   Contact information:   [address] [address] [phone]      Signed: Lars Pinks MPA-C 02/21/2015, 11:47 AM

## 2015-02-20 NOTE — Progress Notes (Signed)
Pt Chest tube; right IJ and On Q removed per order and protocol with charge RN; pt VSS; pt denies any discomfort or distress and non witnessed; clean, dry sterile dressings applied to sites, no drainage or bleeding noted; tips remains intact to on Q and IJ catheters upon removal. Chest tube had 40cc output on this shift prior to removal. Pt in bed comfortably with call light within reach and friend at bedside. Will closely monitor. Francis Gaines Fortune Torosian RN.

## 2015-02-20 NOTE — Progress Notes (Signed)
      [address]       [place] [address]             [phone]       3 Days Post-Op Procedure(s) (LRB): VIDEO BRONCHOSCOPY (N/A) THORACOTOMY WITH LEFT UPPER LOBECTOMY WITH LYMPH NODE DISSECTION, OPEN CLOSURE OF LEFT UPPER LOBE BRONCHUS, INTERCOSTAL MUSCLE FLAP COVERAGE OF LEFT UPPER LOBE BRONCHIAL STUMP, PLACEMENT OF ON-Q (Left)  Subjective: Patient sitting in chair. No complaints.  Objective: Vital signs in last 24 hours: Temp:  [97.6 F (36.4 C)-98.2 F (36.8 C)] 98.2 F (36.8 C) (06/30 0520) Pulse Rate:  [57-73] 67 (06/30 0520) Cardiac Rhythm:  [-] Sinus bradycardia (06/29 1945) Resp:  [15-21] 16 (06/30 0520) BP: (104-138)/(50-66) 138/66 mmHg (06/30 0520) SpO2:  [93 %-98 %] 97 % (06/30 0520)     Intake/Output from previous day: 06/29 0701 - 06/30 0700 In: 650 [P.O.:580; I.V.:70] Out: 7588 [Urine:3695; Chest Tube:100]   Physical Exam:  Cardiovascular: RRR. Pulmonary: Coarse on left and clear on right. Abdomen: Soft, non tender, bowel sounds present. Extremities: Trace lower extremity edema. Wounds: Clean and dry.  No erythema or signs of infection. Chest Tube: to water seal and no air leak  Lab Results: CBC: Recent Labs  02/19/15 0445 02/20/15 0400  WBC 9.6 10.2  HGB 11.3* 11.7*  HCT 34.3* 35.1*  PLT 167 172   BMET:  Recent Labs  02/19/15 0445 02/20/15 0400  NA 139 139  K 3.9 4.1  CL 101 101  CO2 29 30  GLUCOSE 96 97  BUN 13 10  CREATININE 0.86 0.88  CALCIUM 8.4* 8.5*    PT/INR: No results for input(s): LABPROT, INR in the last 72 hours. ABG:  INR: Will add last result for INR, ABG once components are confirmed Will add last 4 CBG results once components are confirmed  Assessment/Plan:  1. CV - SR in the 70-80's. 2.  Pulmonary - On room air. Chest tube with 100 cc of output last 24 hours. There is no air leak. Will likely remove remaining chest tube. CXR shows no pneumothorax. Encourage incentive spirometer. 3.  GU-urinary retention and required foley re insertion. Not on Flomax. Foley removed earlier this am. He is voiding on his own. 4. ABL anemia-H and H stable at 11.7 and 35.1 5. Stop PCA after chest tube removed and remove On Q today 6. Possibly home in am  Temple Sporer MPA-C 02/20/2015,7:53 AM

## 2015-02-20 NOTE — Progress Notes (Signed)
Pt transferred to 2w09; reported off to oncoming RN; pt denies any distress or discomfort; pt ambulated from old room to new room. Pt sitting up in chair with call light within reach. Francis Gaines Amare Bail RN.

## 2015-02-21 ENCOUNTER — Inpatient Hospital Stay (HOSPITAL_COMMUNITY): Payer: No Typology Code available for payment source

## 2015-02-21 MED ORDER — OXYCODONE HCL 5 MG PO TABS: 5.0000 mg | ORAL_TABLET | ORAL | Status: DC | PRN

## 2015-02-21 MED ORDER — ACETAMINOPHEN 500 MG PO TABS: 500.0000 mg | ORAL_TABLET | Freq: Four times a day (QID) | ORAL | Status: DC | PRN

## 2015-02-21 NOTE — Progress Notes (Signed)
02/21/2015 10:39 AM Discharge AVS meds taken today and those due this evening reviewed.  Follow-up appointments and when to call md reviewed.  D/C IV and TELE.  Questions and concerns addressed.   D/C home per orders. [place]ney [place]

## 2015-02-21 NOTE — Progress Notes (Signed)
      [address]       [place] [address]             [phone]       4 Days Post-Op Procedure(s) (LRB): VIDEO BRONCHOSCOPY (N/A) THORACOTOMY WITH LEFT UPPER LOBECTOMY WITH LYMPH NODE DISSECTION, OPEN CLOSURE OF LEFT UPPER LOBE BRONCHUS, INTERCOSTAL MUSCLE FLAP COVERAGE OF LEFT UPPER LOBE BRONCHIAL STUMP, PLACEMENT OF ON-Q (Left)  Subjective: Patient without complaints.  Objective: Vital signs in last 24 hours: Temp:  [97.9 F (36.6 C)-98.3 F (36.8 C)] 98.2 F (36.8 C) (07/01 0452) Pulse Rate:  [65-79] 65 (07/01 0452) Cardiac Rhythm:  [-] Normal sinus rhythm (06/30 2149) Resp:  [18] 18 (07/01 0452) BP: (113-139)/(52-65) 113/62 mmHg (07/01 0452) SpO2:  [96 %-99 %] 96 % (07/01 0452)     Intake/Output from previous day: 06/30 0701 - 07/01 0700 In: 720 [P.O.:720] Out: 2740 [Urine:2700; Chest Tube:40]   Physical Exam:  Cardiovascular: RRR Pulmonary: Mostly clear Abdomen: Soft, non tender, bowel sounds present. Extremities: No lower extremity edema. Wounds: Clean and dry.  No erythema or signs of infection.   Lab Results: CBC:  Recent Labs  02/19/15 0445 02/20/15 0400  WBC 9.6 10.2  HGB 11.3* 11.7*  HCT 34.3* 35.1*  PLT 167 172   BMET:   Recent Labs  02/19/15 0445 02/20/15 0400  NA 139 139  K 3.9 4.1  CL 101 101  CO2 29 30  GLUCOSE 96 97  BUN 13 10  CREATININE 0.86 0.88  CALCIUM 8.4* 8.5*    PT/INR: No results for input(s): LABPROT, INR in the last 72 hours. ABG:  INR: Will add last result for INR, ABG once components are confirmed Will add last 4 CBG results once components are confirmed  Assessment/Plan:  1. CV - SR in the 70-80's. 2.  Pulmonary - On room air.CXR appears to show no pneumothorax. Encourage incentive spirometer. 3. ABL anemia-H and H stable at 11.7 and 35.1 4. Discharge home  ZIMMERMAN,DONIELLE MPA-C 02/21/2015,7:50 AM

## 2015-02-28 ENCOUNTER — Ambulatory Visit (INDEPENDENT_AMBULATORY_CARE_PROVIDER_SITE_OTHER): Payer: Self-pay

## 2015-02-28 DIAGNOSIS — Z4802 Encounter for removal of sutures: Secondary | ICD-10-CM

## 2015-02-28 DIAGNOSIS — R918 Other nonspecific abnormal finding of lung field: Secondary | ICD-10-CM

## 2015-02-28 NOTE — Progress Notes (Signed)
Removed 2 sutures from chest tube sites. No signs of infection and patient tolerated well.

## 2015-03-05 ENCOUNTER — Other Ambulatory Visit: Payer: Self-pay

## 2015-03-05 DIAGNOSIS — G8918 Other acute postprocedural pain: Secondary | ICD-10-CM

## 2015-03-05 MED ORDER — OXYCODONE HCL 5 MG PO TABS: 5.0000 mg | ORAL_TABLET | Freq: Four times a day (QID) | ORAL | Status: DC | PRN

## 2015-03-05 NOTE — Telephone Encounter (Signed)
RX refill for Oxycodone 5 mg given.

## 2015-03-10 ENCOUNTER — Other Ambulatory Visit: Payer: Self-pay | Admitting: Cardiothoracic Surgery

## 2015-03-10 DIAGNOSIS — R918 Other nonspecific abnormal finding of lung field: Secondary | ICD-10-CM

## 2015-03-12 ENCOUNTER — Ambulatory Visit (HOSPITAL_COMMUNITY): Payer: Self-pay | Admitting: Dentistry

## 2015-03-12 ENCOUNTER — Ambulatory Visit (INDEPENDENT_AMBULATORY_CARE_PROVIDER_SITE_OTHER): Payer: No Typology Code available for payment source | Admitting: Unknown Physician Specialty

## 2015-03-12 ENCOUNTER — Encounter: Payer: Self-pay | Admitting: Unknown Physician Specialty

## 2015-03-12 ENCOUNTER — Encounter (HOSPITAL_COMMUNITY): Payer: Self-pay | Admitting: Dentistry

## 2015-03-12 VITALS — BP 119/62 | HR 73 | Temp 98.3°F | Ht 70.7 in | Wt 199.2 lb

## 2015-03-12 DIAGNOSIS — R059 Cough, unspecified: Secondary | ICD-10-CM

## 2015-03-12 DIAGNOSIS — R05 Cough: Secondary | ICD-10-CM

## 2015-03-12 DIAGNOSIS — K08109 Complete loss of teeth, unspecified cause, unspecified class: Secondary | ICD-10-CM

## 2015-03-12 NOTE — Progress Notes (Signed)
PROGRESS NOTE:  03/12/2015   Timothy Benitez [id number]  VITALS: BP 114/62 mmHg  Pulse 60  Temp(Src) 98.4 F (36.9 C) (Oral)  LABS:  Lab Results  Component Value Date   WBC 10.2 02/20/2015   HGB 11.7* 02/20/2015   HCT 35.1* 02/20/2015   MCV 85.8 02/20/2015   PLT 172 02/20/2015   BMET    Component Value Date/Time   NA 139 02/20/2015 0400   K 4.1 02/20/2015 0400   CL 101 02/20/2015 0400   CO2 30 02/20/2015 0400   GLUCOSE 97 02/20/2015 0400   BUN 10 02/20/2015 0400   CREATININE 0.88 02/20/2015 0400   CALCIUM 8.5* 02/20/2015 0400   GFRNONAA >60 02/20/2015 0400   GFRAA >60 02/20/2015 0400    Lab Results  Component Value Date   INR 0.94 02/13/2015   No results found for: PTT   Timothy Benitez is status post extraction of remaining teeth with alveoloplasty and pre-prosthetic surgery as needed in the operating room with general anesthesia on 01/29/2015. Patient was seen for evaluation of healing and suture removal on 02/10/2015. Exposed bone of the lower left lingual mandible was noted at that time. A partial ostectomy procedure was then performed and patient was prescribed chlorhexidine rinses. Patient continues use chlorhexidine rinses and feels that the area is healed in. Patient subsequently underwent lung surgery with Dr. Servando Snare and is currently scheduled to see him tomorrow for follow-up evaluation. Patient indicates that no chemotherapy is planned at this time.   SUBJECTIVE: Patient with minimal oral discomfort. Patient indicates that the previous area of the lower left mandible has healed in. Patient continues to use chlorhexidine rinses as prescribed.  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 The previous area of exposed bone has healed in completely with soft tissue coverage.  Orthopantogram was taken with no obvious problems noted. No retained roots are noted. Patient is edentulous. There is atrophy of the edentulous  alveolar ridges. Maxillary sinuses are noted with some opacity noted.  ASSESSMENT: Post operative course is consistent with dental procedures performed in the OR. Patient is edentulous. 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 The previous area of exposed bone involving the lower left lingual alveolar ridge has now resolved.   Plan: 1. Continue to Use chlorhexidine rinses after breakfast and at bedtime as prescribed. 2. Use salt water rinses as needed to aid healing in between the chlorhexidine rinses. 3. Brush tongue daily. 4. Advance diet as tolerated. 5. Follow-up with Dental medicine for fabrication of upper and lower complete dentures in one week. Price quote was provided. Patient referral to prosthodontist for implant therapy and dentures was declined at this time. Call as needed  Lenn Cal, DDS

## 2015-03-12 NOTE — Patient Instructions (Signed)
Plan: 1. Continue to Use chlorhexidine rinses after breakfast and at bedtime as prescribed. 2. Use salt water rinses as needed to aid healing in between the chlorhexidine rinses. 3. Brush tongue daily. 4. Advance diet as tolerated. 5. Follow-up with Dental medicine for fabrication of upper and lower complete dentures in one week. Price quote was provided. Patient referral to prosthodontist for implant therapy and dentures was declined at this time. Call as needed  Lenn Cal, DDS

## 2015-03-12 NOTE — Progress Notes (Signed)
BP 119/62 mmHg  Pulse 73  Temp(Src) 98.3 F (36.8 C)  Ht 5' 10.7" (1.796 m)  Wt 199 lb 3.2 oz (90.357 kg)  BMI 28.01 kg/m2  SpO2 97%   Subjective:    Patient ID: Timothy Benitez, male    DOB: [birth year], 54 y.o.   MRN: [id number]  HPI: Timothy Benitez is a 54 y.o. male  Chief Complaint  Patient presents with  . Cough    pt states cough is productive on occasion  . Nasal Congestion    pt states congestion is mainly in the mornings   Cough This is a chronic problem. The current episode started in the past 7 days. The problem has been unchanged. The cough is non-productive. Pertinent negatives include no chest pain, chills, ear congestion, fever, nasal congestion or rhinorrhea. Exacerbated by: Improved by waliking and being upright. He has tried nothing for the symptoms. His past medical history is significant for COPD.   History is significant for a lobecomy 3 weeks ago.    Relevant past medical, surgical, family and social history reviewed and updated as indicated. Interim medical history since our last visit reviewed. Allergies and medications reviewed and updated.  Review of Systems  Constitutional: Negative for fever and chills.  HENT: Negative for rhinorrhea.   Respiratory: Positive for cough.   Cardiovascular: Negative for chest pain.    Per HPI unless specifically indicated above     Objective:    BP 119/62 mmHg  Pulse 73  Temp(Src) 98.3 F (36.8 C)  Ht 5' 10.7" (1.796 m)  Wt 199 lb 3.2 oz (90.357 kg)  BMI 28.01 kg/m2  SpO2 97%  Wt Readings from Last 3 Encounters:  03/12/15 199 lb 3.2 oz (90.357 kg)  02/18/15 202 lb 2.6 oz (91.7 kg)  02/13/15 204 lb (92.534 kg)    Physical Exam  Constitutional: He is oriented to person, place, and time. He appears well-developed and well-nourished. No distress.  HENT:  Head: Normocephalic and atraumatic.  Eyes: Conjunctivae and lids are normal. Right eye exhibits no discharge. Left eye exhibits no discharge. No  scleral icterus.  Cardiovascular: Normal rate and regular rhythm.   Pulmonary/Chest: Effort normal. No respiratory distress. He has decreased breath sounds in the left upper field and the left middle field. He has wheezes in the left upper field, the left middle field and the left lower field.  Abdominal: Normal appearance and bowel sounds are normal. He exhibits no distension. There is no splenomegaly or hepatomegaly. There is no tenderness.  Musculoskeletal: Normal range of motion.  Neurological: He is alert and oriented to person, place, and time.  Skin: Skin is intact. No rash noted. No pallor.  Psychiatric: He has a normal mood and affect. His behavior is normal. Judgment and thought content normal.  Vitals reviewed.   Assessment & Plan:   Problem List Items Addressed This Visit    None    Visit Diagnoses    Cough    -  Primary    Pt with a cough and s/p lobectomy 3 weeks.  I feel he needs a chest x-ray which will be tomorrow with f/u with thoracic surgeon.         Cough may be related to healing vs acute exacerbation.  i will leave treatment to discretion of cardio-thoracic surgeon as he is very stable today.   Postnasal drainage is clear and I don't suspect a sinusitis.      Follow up plan: Return if symptoms  worsen or fail to improve, for and with cardio-thoracic surgeon.

## 2015-03-13 ENCOUNTER — Ambulatory Visit (INDEPENDENT_AMBULATORY_CARE_PROVIDER_SITE_OTHER): Payer: Self-pay | Admitting: Surgical

## 2015-03-13 ENCOUNTER — Ambulatory Visit
Admission: RE | Admit: 2015-03-13 | Discharge: 2015-03-13 | Disposition: A | Discharge status: Home / Self Care | Payer: No Typology Code available for payment source | Source: Ambulatory Visit | Attending: Cardiothoracic Surgery | Admitting: Cardiothoracic Surgery

## 2015-03-13 ENCOUNTER — Ambulatory Visit: Payer: Self-pay | Admitting: Cardiothoracic Surgery

## 2015-03-13 VITALS — BP 115/69 | HR 62 | Resp 16 | Ht 70.5 in | Wt 199.0 lb

## 2015-03-13 DIAGNOSIS — R918 Other nonspecific abnormal finding of lung field: Secondary | ICD-10-CM

## 2015-03-13 DIAGNOSIS — D3A00 Benign carcinoid tumor of unspecified site: Secondary | ICD-10-CM

## 2015-03-13 DIAGNOSIS — Z9889 Other specified postprocedural states: Secondary | ICD-10-CM

## 2015-03-13 DIAGNOSIS — Z902 Acquired absence of lung [part of]: Secondary | ICD-10-CM

## 2015-03-13 DIAGNOSIS — C3412 Malignant neoplasm of upper lobe, left bronchus or lung: Secondary | ICD-10-CM

## 2015-03-13 NOTE — Progress Notes (Signed)
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  SURGICAL PROCEDURE: bronchoscopy left video-assisted thoracoscopy, left mini thoracotomy, left upper lobectomy with open suture closure of the left upper lobe bronchial stump, placement of On-Q and lymph node dissection.  SURGEON: [name], MD.  FIRST ASSISTANT: Lars Pinks, PA  History of Present Illness:    This is a 54 year old male status post above procedure seen in the office today in routine follow-up. Overall he feels remarkably well without specific complaints. Pain medication is used at this time only at night and he does take occasional Tylenol. He is not having any difficulty with shortness of breath. He has done some brief driving without difficulty. He is not having any fevers, chills or other constitutional symptoms. He has had no significant difficulties associated with the incision.         Zubrod Score: At the time of surgery this patient's most appropriate activity status/level should be described as: '[x]'$     0    Normal activity, no symptoms '[]'$     1    Restricted in physical strenuous activity but ambulatory, able to do out light work '[]'$     2    Ambulatory and capable of self care, unable to do work activities, up and about                 >50 % of waking hours                                                                                   '[]'$     3    Only limited self care, in bed greater than 50% of waking hours '[]'$     4    Completely  disabled, no self care, confined to bed or chair '[]'$     5    Moribund  History  Smoking status  . Former Smoker  . Types: Cigarettes  . Quit date: 09/27/2013  Smokeless tobacco  . Never Used    Comment: quit smoking in 2015(cig) and cigars(01/29/15)       No Known Allergies  Current Outpatient Prescriptions  Medication Sig Dispense Refill  . acetaminophen (TYLENOL) 500 MG tablet Take 1-2 tablets (500-1,000 mg total) by mouth every 6 (six) hours as needed for mild pain or headache. 30 tablet 0  . albuterol (PROVENTIL HFA;VENTOLIN HFA) 108 (90 BASE) MCG/ACT inhaler Inhale 1-2 puffs into the lungs every 6 (six) hours as needed for wheezing or shortness of breath.    Marland Kitchen atorvastatin (LIPITOR) 20 MG tablet Take 20 mg by mouth every evening.     Marland Kitchen  b complex vitamins tablet Take 1 tablet by mouth daily.    . budesonide-formoterol (SYMBICORT) 160-4.5 MCG/ACT inhaler Inhale 2 puffs into the lungs 2 (two) times daily.    . chlorhexidine (PERIDEX) 0.12 % solution Rinse with 15 mls twice daily for 30 seconds. Use after breakfast and at bedtime. Spit out excess. Do not swallow. 480 mL 6  . Cholecalciferol (VITAMIN D) 2000 UNITS CAPS Take 1 capsule by mouth daily.    Marland Kitchen oxyCODONE (OXY IR/ROXICODONE) 5 MG immediate release tablet Take 1-2 tablets (5-10 mg total) by mouth every 6 (six) hours as needed for severe pain. 40 tablet 0   No current facility-administered medications for this visit.       Physical Exam: BP 115/69 mmHg  Pulse 62  Resp 16  Ht 5' 10.5" (1.791 m)  Wt 199 lb (90.266 kg)  BMI 28.14 kg/m2  SpO2 97%  General appearance: alert, cooperative and no distress Heart: regular rate and rhythm Lungs: clear to auscultation bilaterally Wound: incis healing well   Diagnostic Studies & Laboratory data:         Recent Radiology Findings: Dg Chest 2 View  03/13/2015   CLINICAL DATA:  Left chest pain since a left upper lobectomy on 02/17/2015. Ex-smoker.  EXAM: CHEST  2 VIEW   COMPARISON:  02/21/2015.  FINDINGS: Normal sized heart. Small amount of residual linear density at the left lung base. Stable elevation of the left hemidiaphragm. Clear right lung. Thoracic spine degenerative changes.  IMPRESSION: No acute abnormality.  Minimal left basilar linear scarring.   Electronically Signed   By: Claudie Revering M.D.   On: 03/13/2015 13:24      I have independently reviewed the above radiology findings and reviewed findings  with the patient.  Recent Labs: Lab Results  Component Value Date   WBC 10.2 02/20/2015   HGB 11.7* 02/20/2015   HCT 35.1* 02/20/2015   PLT 172 02/20/2015   GLUCOSE 97 02/20/2015   ALT 32 02/19/2015   AST 36 02/19/2015   NA 139 02/20/2015   K 4.1 02/20/2015   CL 101 02/20/2015   CREATININE 0.88 02/20/2015   BUN 10 02/20/2015   CO2 30 02/20/2015   INR 0.94 02/13/2015      Assessment / Plan:      Patient is doing quite well following his resection of carcinoid tumor. We will see him again in approximately 3 weeks with a repeat chest x-ray. He is to continue activity progression with less weight lifting restrictions and encouragement to increase ambulation as tolerated. He will not require adjuvant therapy for the carcinoid tumor. This was discussed in tumor conference.    Miata Culbreth E 03/13/2015 1:53 PM

## 2015-03-13 NOTE — Patient Instructions (Signed)
Verbal instructions given regarding activity progression and lifting instructions

## 2015-03-17 ENCOUNTER — Ambulatory Visit: Payer: Self-pay

## 2015-03-19 ENCOUNTER — Encounter (HOSPITAL_COMMUNITY): Payer: Self-pay | Admitting: Dentistry

## 2015-03-24 ENCOUNTER — Encounter (HOSPITAL_COMMUNITY): Payer: Self-pay | Admitting: Dentistry

## 2015-03-24 ENCOUNTER — Ambulatory Visit (HOSPITAL_COMMUNITY): Payer: Self-pay | Admitting: Dentistry

## 2015-03-24 VITALS — BP 133/62 | HR 61 | Temp 97.8°F

## 2015-03-24 DIAGNOSIS — Z463 Encounter for fitting and adjustment of dental prosthetic device: Secondary | ICD-10-CM

## 2015-03-24 DIAGNOSIS — K0820 Unspecified atrophy of edentulous alveolar ridge: Secondary | ICD-10-CM

## 2015-03-24 DIAGNOSIS — D1430 Benign neoplasm of unspecified bronchus and lung: Secondary | ICD-10-CM

## 2015-03-24 DIAGNOSIS — K08109 Complete loss of teeth, unspecified cause, unspecified class: Secondary | ICD-10-CM

## 2015-03-24 NOTE — Progress Notes (Signed)
03/24/2015  Patient Name:   Timothy Benitez Date of Birth:   22-Jan-1961 Medical Record Number: [id number]  BP 133/62 mmHg  Pulse 61  Temp(Src) 97.8 F (36.6 C) (Oral)  Timothy Benitez presents for start of upper and lower denture fabrication.  Exam: Patient is edentulous. Discussed procedures involved in upper and lower denture fabrication and prognosis for successful ability to wear dentures. Price for dentures confirmed.  Patient agrees to proceed with upper and lower denture fabrication. Procedure:  Upper and lower denture primary impressions in alginate. Lab pour. To [employer] for upper and lower denture custom tray fabrication. RTC for upper and lower denture final impressions.  Lenn Cal, DDS

## 2015-03-24 NOTE — Patient Instructions (Signed)
Return to clinic as scheduled for continued upper and lower complete denture fabrication. Dr. Mailyn Steichen 

## 2015-04-01 ENCOUNTER — Encounter (HOSPITAL_COMMUNITY): Payer: Self-pay | Admitting: Dentistry

## 2015-04-01 ENCOUNTER — Ambulatory Visit (HOSPITAL_COMMUNITY): Payer: Self-pay | Admitting: Dentistry

## 2015-04-01 VITALS — BP 114/70 | HR 62 | Temp 98.4°F

## 2015-04-01 DIAGNOSIS — D1430 Benign neoplasm of unspecified bronchus and lung: Secondary | ICD-10-CM

## 2015-04-01 DIAGNOSIS — K0820 Unspecified atrophy of edentulous alveolar ridge: Secondary | ICD-10-CM

## 2015-04-01 DIAGNOSIS — K08109 Complete loss of teeth, unspecified cause, unspecified class: Secondary | ICD-10-CM

## 2015-04-01 DIAGNOSIS — Z463 Encounter for fitting and adjustment of dental prosthetic device: Secondary | ICD-10-CM

## 2015-04-01 NOTE — Patient Instructions (Signed)
Return to clinic as scheduled for continued upper and lower complete denture fabrication. Dr. Sy Saintjean 

## 2015-04-01 NOTE — Progress Notes (Signed)
04/01/2015  Patient Name:   Timothy Benitez Date of Birth:   [birth year] Medical Record Number: [id number]  BP 114/70 mmHg  Pulse 62  Temp(Src) 98.4 F (36.9 C)  Timothy Benitez presents for continued upper and lower complete denture fabrication.  Procedure:  Upper and lower border molding and final impressions in Aquasil. Patient tolerated procedure well. To [employer] for custom baseplates with rims. Return to clinic for upper and lower complete denture jaw relations.  Lenn Cal, DDS

## 2015-04-09 ENCOUNTER — Ambulatory Visit (HOSPITAL_COMMUNITY): Payer: Self-pay | Admitting: Dentistry

## 2015-04-09 ENCOUNTER — Encounter (HOSPITAL_COMMUNITY): Payer: Self-pay | Admitting: Dentistry

## 2015-04-09 VITALS — BP 130/68 | HR 69 | Temp 97.8°F

## 2015-04-09 DIAGNOSIS — Z463 Encounter for fitting and adjustment of dental prosthetic device: Secondary | ICD-10-CM

## 2015-04-09 DIAGNOSIS — K0820 Unspecified atrophy of edentulous alveolar ridge: Secondary | ICD-10-CM

## 2015-04-09 DIAGNOSIS — K08109 Complete loss of teeth, unspecified cause, unspecified class: Secondary | ICD-10-CM

## 2015-04-09 DIAGNOSIS — D1430 Benign neoplasm of unspecified bronchus and lung: Secondary | ICD-10-CM

## 2015-04-09 NOTE — Patient Instructions (Signed)
Return to clinic as scheduled for continued upper and lower complete denture fabrication. Dr. Kulinski 

## 2015-04-09 NOTE — Progress Notes (Signed)
04/09/2015  Patient Name:   Timothy Benitez Date of Birth:   1961-08-09 Medical Record Number: [id number]  BP 130/68 mmHg  Pulse 69  Temp(Src) 97.8 F (36.6 C) (Oral)   [name] presents for continued denture fabrication.  Procedure:  Upper and lower denture Jaw relations with aluwax bite registration. Patient agrees to tooth selection of 21X, P, and 10 degree posteriors to match with Portrait A2 shade. Patient is aware that lower premolar may be dropped from the set up due to class II malocclusion. Patient tolerated procedure well. RTC for denture wax try in.   Lenn Cal, DDS

## 2015-04-11 ENCOUNTER — Other Ambulatory Visit: Payer: Self-pay | Admitting: Cardiothoracic Surgery

## 2015-04-11 DIAGNOSIS — R918 Other nonspecific abnormal finding of lung field: Secondary | ICD-10-CM

## 2015-04-14 ENCOUNTER — Ambulatory Visit
Admission: RE | Admit: 2015-04-14 | Discharge: 2015-04-14 | Disposition: A | Discharge status: Home / Self Care | Payer: No Typology Code available for payment source | Source: Ambulatory Visit | Attending: Cardiothoracic Surgery | Admitting: Cardiothoracic Surgery

## 2015-04-14 ENCOUNTER — Ambulatory Visit (INDEPENDENT_AMBULATORY_CARE_PROVIDER_SITE_OTHER): Payer: Self-pay | Admitting: Cardiothoracic Surgery

## 2015-04-14 ENCOUNTER — Encounter: Payer: Self-pay | Admitting: Cardiothoracic Surgery

## 2015-04-14 VITALS — BP 118/66 | HR 72 | Resp 20 | Ht 70.5 in | Wt 200.0 lb

## 2015-04-14 DIAGNOSIS — D3A00 Benign carcinoid tumor of unspecified site: Secondary | ICD-10-CM

## 2015-04-14 DIAGNOSIS — Z9889 Other specified postprocedural states: Secondary | ICD-10-CM

## 2015-04-14 DIAGNOSIS — R918 Other nonspecific abnormal finding of lung field: Secondary | ICD-10-CM

## 2015-04-14 DIAGNOSIS — Z902 Acquired absence of lung [part of]: Secondary | ICD-10-CM

## 2015-04-14 NOTE — Progress Notes (Signed)
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 SURGICAL PROCEDURE: bronchoscopy left video-assisted thoracoscopy, left mini thoracotomy, left upper lobectomy with open suture closure of the left upper lobe bronchial stump, placement of On-Q and lymph node dissection. SURGEON: [name], MD.    History of Present Illness:    This is a 54 year old male e seen in the office today in routine follow-up. Overall he feels remarkably well he has returned to work part-time. He is no longer needing pain medication l. He is not having any difficulty with shortness of breath. He is not having any fevers, chills or other constitutional symptoms. He has had no significant difficulties associated with the incision. He notes some dysesthesias over the left lower chest.     Zubrod Score: At the time of surgery this patient's most appropriate activity status/level should be described as: '[x]'$     0    Normal activity, no symptoms '[]'$     1    Restricted in physical strenuous activity but ambulatory, able to do out light work '[]'$     2    Ambulatory and capable of self care, unable to do work activities, up and about                 >50 % of waking hours                                                                                   '[]'$     3    Only limited self care, in bed greater than 50% of waking hours '[]'$     4    Completely disabled, no self care, confined to bed or chair '[]'$     5    Moribund  History  Smoking status  . Former Smoker  .  Types: Cigarettes  . Quit date: 09/27/2013  Smokeless tobacco  . Never Used    Comment: quit smoking in 2015(cig) and cigars(01/29/15)       No Known Allergies  Current Outpatient Prescriptions  Medication Sig Dispense Refill  . acetaminophen (TYLENOL) 500 MG tablet Take 1-2 tablets (500-1,000 mg total) by mouth every 6 (six) hours as needed for mild pain or headache. 30 tablet 0  . albuterol (PROVENTIL HFA;VENTOLIN HFA) 108 (90 BASE) MCG/ACT inhaler Inhale 1-2 puffs into the lungs every 6 (six) hours as needed for wheezing or shortness of breath.    Marland Kitchen atorvastatin (LIPITOR) 20 MG tablet Take 20 mg by mouth every evening.     Marland Kitchen b complex vitamins tablet Take 1 tablet by mouth daily.    . budesonide-formoterol (SYMBICORT) 160-4.5 MCG/ACT inhaler  Inhale 2 puffs into the lungs 2 (two) times daily.    . chlorhexidine (PERIDEX) 0.12 % solution Rinse with 15 mls twice daily for 30 seconds. Use after breakfast and at bedtime. Spit out excess. Do not swallow. 480 mL 6  . Cholecalciferol (VITAMIN D) 2000 UNITS CAPS Take 1 capsule by mouth daily.     No current facility-administered medications for this visit.       Physical Exam: BP 118/66 mmHg  Pulse 72  Resp 20  Ht 5' 10.5" (1.791 m)  Wt 200 lb (90.719 kg)  BMI 28.28 kg/m2  SpO2 98%  General appearance: alert, cooperative and no distress Heart: regular rate and rhythm Lungs: clear to auscultation bilaterally Wound: incis healing well   Diagnostic Studies & Laboratory data:         Recent Radiology Findings: Dg Chest 2 View  04/14/2015   CLINICAL DATA:  Video-assisted thoracic surgery on February 17, 2015 for left lung mass, follow-up appointment.  EXAM: CHEST  2 VIEW  COMPARISON:  PA and lateral chest x-ray of March 13, 2015  FINDINGS: The lungs are adequately inflated and clear. There is no pleural effusion or pneumothorax. The heart and pulmonary vascularity are normal. The mediastinum is normal in width. There is mild stable loss  of height of a mid thoracic vertebral body.  IMPRESSION: There is no active cardiopulmonary disease.   Electronically Signed   By: David  Martinique M.D.   On: 04/14/2015 14:32      I have independently reviewed the above radiology findings and reviewed findings  with the patient.  Recent Labs: Lab Results  Component Value Date   WBC 10.2 02/20/2015   HGB 11.7* 02/20/2015   HCT 35.1* 02/20/2015   PLT 172 02/20/2015   GLUCOSE 97 02/20/2015   ALT 32 02/19/2015   AST 36 02/19/2015   NA 139 02/20/2015   K 4.1 02/20/2015   CL 101 02/20/2015   CREATININE 0.88 02/20/2015   BUN 10 02/20/2015   CO2 30 02/20/2015   INR 0.94 02/13/2015      Assessment / Plan:      Patient is doingwell following his resection of carcinoid tumor. We will see him again in approximately 3 months with a repeat chest x-ray. He will not require adjuvant therapy for the carcinoid tumor.    [name] 04/14/2015 5:33 PM

## 2015-04-15 ENCOUNTER — Ambulatory Visit (HOSPITAL_COMMUNITY): Payer: Self-pay | Admitting: Dentistry

## 2015-04-15 ENCOUNTER — Encounter (HOSPITAL_COMMUNITY): Payer: Self-pay | Admitting: Dentistry

## 2015-04-15 VITALS — BP 112/62 | HR 69 | Temp 98.1°F

## 2015-04-15 DIAGNOSIS — Z463 Encounter for fitting and adjustment of dental prosthetic device: Secondary | ICD-10-CM

## 2015-04-15 DIAGNOSIS — K08109 Complete loss of teeth, unspecified cause, unspecified class: Secondary | ICD-10-CM

## 2015-04-15 DIAGNOSIS — D1430 Benign neoplasm of unspecified bronchus and lung: Secondary | ICD-10-CM

## 2015-04-15 DIAGNOSIS — K0820 Unspecified atrophy of edentulous alveolar ridge: Secondary | ICD-10-CM

## 2015-04-15 NOTE — Patient Instructions (Signed)
Return to clinic as scheduled for continued upper lower complete denture fabrication. Dr. Nezar Buckles 

## 2015-04-15 NOTE — Progress Notes (Signed)
04/15/2015  Patient Name:   Timothy Benitez Date of Birth:   04/25/1961 Medical Record Number: [id number]   BP 112/62 mmHg  Pulse 69  Temp(Src) 98.1 F (36.7 C) (Oral)  [name] presents for continued upper and lower denture fabrication.  Procedure:  Upper and lower denture wax tryin. 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 Patient accepts esthetics, phonetics, fit and function. Patient agrees to process "as is" in [place] [address]. Patient to RTC for  upper and lower denture insertion.  Lenn Cal, DDS

## 2015-04-17 ENCOUNTER — Encounter (HOSPITAL_COMMUNITY): Payer: Self-pay | Admitting: Dentistry

## 2015-04-24 ENCOUNTER — Encounter (HOSPITAL_COMMUNITY): Payer: Self-pay | Admitting: Dentistry

## 2015-04-24 ENCOUNTER — Ambulatory Visit (HOSPITAL_COMMUNITY): Payer: Self-pay | Admitting: Dentistry

## 2015-04-24 VITALS — BP 122/61 | HR 72 | Temp 98.2°F

## 2015-04-24 DIAGNOSIS — Z463 Encounter for fitting and adjustment of dental prosthetic device: Secondary | ICD-10-CM

## 2015-04-24 DIAGNOSIS — K08109 Complete loss of teeth, unspecified cause, unspecified class: Secondary | ICD-10-CM

## 2015-04-24 DIAGNOSIS — D1430 Benign neoplasm of unspecified bronchus and lung: Secondary | ICD-10-CM

## 2015-04-24 DIAGNOSIS — K0820 Unspecified atrophy of edentulous alveolar ridge: Secondary | ICD-10-CM

## 2015-04-24 NOTE — Patient Instructions (Signed)
Instructions for Denture Use and Care  Congratulations, you are on the way to oral rehabilitation!  You have just received a new set of complete or partial dentures.  These prostheses will help to improve both your appearance and chewing ability.  These instructions will help you get adjusted to your dentures as well as care for them properly.  Please read these instructions carefully and completely as soon as you get home.  If you or your caregiver have any questions please notify the [facility] at [phone].  HOW YOUR DENTURES LOOK AND FEEL Soon after you begin wearing your dentures, you may feel that your dentures are too large or even loose.  As our mouth and facial muscles become accustomed to the dentures, these feelings will go away.  You also may feel that you are salivating more than you normally do.  This feeling should go away as you get used to having the dentures in your mouth.  You may bite your cheek or your tongue; this will eventually resolve itself as you wear your dentures.  Some soreness is to be expected, but you should not hurt.  If your mouth hurts, call your dentist.  A denture adhesive may occasionally be necessary to hold your dentures in place more securely.  The dentist will let you know when one is recommended for you.  SPEAKING Wearing dentures will change the sound of your voice initially.  This will be noticed by you more than anyone else.  Bite and swallow before you speak, in order to place your dentures in position so that you may speak more clearly.  Practice speaking by reading aloud or counting from 1 to 100 very slowly and distinctly.  After some practice your mouth will become accustomed to your dentures and you will speak more clearly.  EATING Chewing will definitely be different after you receive your dentures.  With a little practice and patience you should be able to eat just about any kind of food.  Begin by eating small quantities of food  that are cut into small pieces.  Star with soft foods such as eggs, cooked vegetables, or puddings.  As you gain confidence advance  Your diet to whatever texture foods you can tolerate.  DENTURE CARE Dentures can collect plaque and calculus much the same as natural teeth can.  If not removed on a regular basis, your dentures will not look or feel clean, and you will experience denture odor.  It is very important that you remove your dentures at bedtime and clean them thoroughly.  You should: 1. Clean your dentures over a sink full of water so if dropped, breakage will be prevented. 2. Rinse your dentures with cool water to remove any large food particles. 3. Use soap and water or a denture cleanser or paste to clean the dentures.  Do not use regular toothpaste as it may abrade the denture base or teeth. 4. Use a moistened denture brush to clean all surfaces (inside and outside). 5. Rinse thoroughly to remove any remaining soap or denture cleanser. 6. Use a soft bristle toothbrush to gently brush any natural teeth, gums, tongue, and palate at bedtime and before reinserting your dentures. 7. Do not sleep with your dentures in your mouth at night.  Remove your dentures and soak them overnight in a denture cup filled with water or denture solution as recommended by your dentist.  This routine will become second nature and will increase the life and comfort   of your dentures.  Please do not try to adjust these dentures yourself; you could damage them.  FOLLOW-UP You should call or make an appointment with your dentist.  Your dentist would like to see you at least once a year for a check-up and examination. 

## 2015-04-24 NOTE — Progress Notes (Signed)
04/24/2015  Patient Name:   Timothy Benitez Date of Birth:   07/08/1961 Medical Record Number: [id number]  BP 122/61 mmHg  Pulse 72  Temp(Src) 98.2 F (36.8 C) (Oral)  Timothy Benitez presents for insertion of upper and lower complete dentures.  Procedure: Pressure indicating paste was applied to the dentures. Adjustments were made as needed. [place] Occlusion evaluated and adjustments made as needed for Centric Relation and protrusive strokes. Good esthetics, phonetics, fit, and function noted. Patient accepts results. Post op instructions provided in written and verbal formats on use and care of dentures. Gave patient denture brush and cup. Patient to keep dentures out if sore spots develop. Use salt water rinses as needed to aid healing. Return to clinic as scheduled for denture adjustment.   Call if problems arise before then.  Lenn Cal, DDS

## 2015-04-29 ENCOUNTER — Ambulatory Visit (HOSPITAL_COMMUNITY): Payer: Self-pay | Admitting: Dentistry

## 2015-04-29 ENCOUNTER — Encounter (HOSPITAL_COMMUNITY): Payer: Self-pay | Admitting: Dentistry

## 2015-04-29 VITALS — BP 117/76 | HR 68 | Temp 98.0°F

## 2015-04-29 DIAGNOSIS — D1430 Benign neoplasm of unspecified bronchus and lung: Secondary | ICD-10-CM

## 2015-04-29 DIAGNOSIS — K08109 Complete loss of teeth, unspecified cause, unspecified class: Secondary | ICD-10-CM

## 2015-04-29 DIAGNOSIS — Z463 Encounter for fitting and adjustment of dental prosthetic device: Secondary | ICD-10-CM

## 2015-04-29 DIAGNOSIS — K0820 Unspecified atrophy of edentulous alveolar ridge: Secondary | ICD-10-CM

## 2015-04-29 DIAGNOSIS — Z972 Presence of dental prosthetic device (complete) (partial): Secondary | ICD-10-CM

## 2015-04-29 NOTE — Patient Instructions (Signed)
Keep dentures out if sore spots arise. Use salt water rinses as needed to aid healing. Return to clinic as scheduled. Call if problems arise before then. Dr. Enrique Sack

## 2015-04-29 NOTE — Progress Notes (Signed)
04/29/2015  Patient Name:   [name] Date of Birth:   Jul 29, 1961 Medical Record Number: [id number]  BP 117/76 mmHg  Pulse 68  Temp(Src) 98 F (36.7 C) (Oral)  Timothy Benitez presents for evaluation of recently inserted upper and lower complete dentures. SUBJECTIVE: Patient is complaining of some denture irritation to the mandibular lingual aspects. OBJECTIVE: There is slight erythema to the mandibular left and mandibular right lingual posterior alveolar ridges.  Procedure: Pressure indicating paste was applied to the dentures. Adjustments were made as needed. [place] Occlusion evaluated and adjustments made as needed for Centric Relation and protrusive strokes. Patient accepts results. Patient to keep dentures out if sore spots develop. Use salt water rinses as needed to aid healing. Return to clinic as scheduled for denture adjustment.   Call if problems arise before then.  Lenn Cal, DDS

## 2015-05-02 ENCOUNTER — Encounter: Payer: Self-pay | Admitting: Internal Medicine

## 2015-05-06 ENCOUNTER — Ambulatory Visit (HOSPITAL_COMMUNITY): Payer: Self-pay | Admitting: Dentistry

## 2015-05-06 VITALS — BP 126/66 | HR 63 | Temp 97.6°F

## 2015-05-06 DIAGNOSIS — D1430 Benign neoplasm of unspecified bronchus and lung: Secondary | ICD-10-CM

## 2015-05-06 DIAGNOSIS — Z972 Presence of dental prosthetic device (complete) (partial): Secondary | ICD-10-CM

## 2015-05-06 DIAGNOSIS — Z463 Encounter for fitting and adjustment of dental prosthetic device: Secondary | ICD-10-CM

## 2015-05-06 DIAGNOSIS — K0820 Unspecified atrophy of edentulous alveolar ridge: Secondary | ICD-10-CM

## 2015-05-06 DIAGNOSIS — K08109 Complete loss of teeth, unspecified cause, unspecified class: Secondary | ICD-10-CM

## 2015-05-06 NOTE — Patient Instructions (Signed)
Patient to keep dentures out if sore spots develop. Use salt water rinses as needed to aid healing. Return to clinic as scheduled for denture adjustment.   Call if problems arise before then.  Ronald F. Kulinski, DDS  

## 2015-05-06 NOTE — Progress Notes (Signed)
05/06/2015  Patient Name:   Timothy Benitez Date of Birth:   [birth year] Medical Record Number: [id number]  BP 126/66 mmHg  Pulse 63  Temp(Src) 97.6 F (36.4 C) (Oral)  [name] presents for evaluation of recently inserted upper and lower complete dentures. SUBJECTIVE: Patient is complaining of some denture irritation to the mandibular left posterior ridge. OBJECTIVE: There is slight erythema to the mandibular left lingual posterior alveolar ridges.  Procedure: Pressure indicating paste was applied to the dentures. Adjustments were made as needed. [place] Occlusion evaluated and adjustments made as needed for Centric Relation and protrusive strokes. Patient accepts results. Patient to keep dentures out if sore spots develop. Use salt water rinses as needed to aid healing. Return to clinic as scheduled for denture adjustment.   Call if problems arise before then.  Lenn Cal, DDS

## 2015-05-27 ENCOUNTER — Encounter (HOSPITAL_COMMUNITY): Payer: Self-pay | Admitting: Dentistry

## 2015-06-03 ENCOUNTER — Ambulatory Visit (HOSPITAL_COMMUNITY): Payer: Self-pay | Admitting: Dentistry

## 2015-06-03 ENCOUNTER — Encounter (HOSPITAL_COMMUNITY): Payer: Self-pay | Admitting: Dentistry

## 2015-06-03 VITALS — BP 129/75 | HR 68 | Temp 97.8°F

## 2015-06-03 DIAGNOSIS — Z972 Presence of dental prosthetic device (complete) (partial): Secondary | ICD-10-CM

## 2015-06-03 DIAGNOSIS — K0820 Unspecified atrophy of edentulous alveolar ridge: Secondary | ICD-10-CM

## 2015-06-03 DIAGNOSIS — K08109 Complete loss of teeth, unspecified cause, unspecified class: Secondary | ICD-10-CM

## 2015-06-03 DIAGNOSIS — D1430 Benign neoplasm of unspecified bronchus and lung: Secondary | ICD-10-CM

## 2015-06-03 DIAGNOSIS — Z463 Encounter for fitting and adjustment of dental prosthetic device: Secondary | ICD-10-CM

## 2015-06-03 NOTE — Patient Instructions (Signed)
Patient to keep dentures out if sore spots develop. Use salt water rinses as needed to aid healing. Return to clinic as scheduled for denture adjustment.   Call if problems arise before then.  Ronald F. Kulinski, DDS  

## 2015-06-03 NOTE — Progress Notes (Signed)
06/03/2015  Patient Name:   Timothy Benitez Date of Birth:   15-Jan-1961 Medical Record Number: [id number]  BP 129/75 mmHg  Pulse 68  Temp(Src) 97.8 F (36.6 C) (Oral)  [name] presents for evaluation of recently inserted upper and lower complete dentures. SUBJECTIVE: Patient is complaining of some denture irritation to the mandibular right alveolar ridge. OBJECTIVE: There is slight erythema to the mandibular right alveolar ridge area #26.  Procedure: Pressure indicating paste was applied to the dentures. Adjustments were made as needed.  Thick PIP to denture borders with adjustments made as needed. [place] Occlusion evaluated and adjustments made as needed for Centric Relation and protrusive strokes. Patient accepts results. Patient to keep dentures out if sore spots develop. Use salt water rinses as needed to aid healing. Return to clinic as scheduled for denture adjustment.   Call if problems arise before then.  Lenn Cal, DDS

## 2015-06-04 ENCOUNTER — Ambulatory Visit (INDEPENDENT_AMBULATORY_CARE_PROVIDER_SITE_OTHER): Payer: No Typology Code available for payment source | Admitting: Unknown Physician Specialty

## 2015-06-04 ENCOUNTER — Other Ambulatory Visit: Payer: Self-pay | Admitting: Unknown Physician Specialty

## 2015-06-04 ENCOUNTER — Encounter: Payer: Self-pay | Admitting: Unknown Physician Specialty

## 2015-06-04 VITALS — BP 130/81 | HR 63 | Temp 98.3°F | Ht 71.2 in | Wt 214.4 lb

## 2015-06-04 DIAGNOSIS — Z0000 Encounter for general adult medical examination without abnormal findings: Secondary | ICD-10-CM

## 2015-06-04 DIAGNOSIS — J449 Chronic obstructive pulmonary disease, unspecified: Secondary | ICD-10-CM | POA: Diagnosis not present

## 2015-06-04 LAB — URINALYSIS, DIPSTICK ONLY
Bilirubin, UA: NEGATIVE
Glucose, UA: NEGATIVE
Ketones, UA: NEGATIVE
Leukocytes, UA: NEGATIVE
Nitrite, UA: NEGATIVE
PH UA: 7 (ref 5.0–7.5)
PROTEIN UA: NEGATIVE
RBC, UA: NEGATIVE
Specific Gravity, UA: 1.015 (ref 1.005–1.030)
Urobilinogen, Ur: 0.2 mg/dL (ref 0.2–1.0)

## 2015-06-04 NOTE — Progress Notes (Signed)
   Commercial Driver Medical Examination   GILMER KAMINSKY is a 54 y.o. male who presents today for a commercial driver fitness determination physical exam. The patient reports no problems. The following portions of the patient's history were reviewed and updated as appropriate. Review of Systems A comprehensive review of systems was negative.   Objective:    Vision:  Uncorrected Corrected Horizontal Field of Vision  Right Eye 20/20 20/20 180 degrees  Left Eye  20/20 20/20 180 degrees  Both Eyes  25/49 82/64    Applicant can recognize and distinguish among traffic control signals and devices showing standard red, green, and amber colors.  Monocular Vision?: No   Hearing: Normal  Urinalysis: Glu: neg Blood: neg Protein: neg  Assessment:    Healthy male exam. Noted bilateral wheezing.  Pulse ox 98%  Meets standards in 49 CFR 391.41;  qualifies for 2 year certificate.    Plan:    Medical examiners certificate completed and printed. Return as needed.

## 2015-06-30 ENCOUNTER — Encounter: Payer: Self-pay | Admitting: Unknown Physician Specialty

## 2015-06-30 NOTE — Telephone Encounter (Signed)
OK to charge insurance with a dx of COPD

## 2015-07-07 ENCOUNTER — Encounter (HOSPITAL_COMMUNITY): Payer: Self-pay | Admitting: Dentistry

## 2015-07-07 ENCOUNTER — Ambulatory Visit (HOSPITAL_COMMUNITY): Payer: Self-pay | Admitting: Dentistry

## 2015-07-07 VITALS — BP 135/70 | HR 86 | Temp 97.8°F

## 2015-07-07 DIAGNOSIS — K0820 Unspecified atrophy of edentulous alveolar ridge: Secondary | ICD-10-CM

## 2015-07-07 DIAGNOSIS — Z972 Presence of dental prosthetic device (complete) (partial): Secondary | ICD-10-CM

## 2015-07-07 DIAGNOSIS — D1430 Benign neoplasm of unspecified bronchus and lung: Secondary | ICD-10-CM

## 2015-07-07 DIAGNOSIS — K08109 Complete loss of teeth, unspecified cause, unspecified class: Secondary | ICD-10-CM

## 2015-07-07 DIAGNOSIS — Z463 Encounter for fitting and adjustment of dental prosthetic device: Secondary | ICD-10-CM

## 2015-07-07 NOTE — Progress Notes (Signed)
07/07/2015  Patient Name:   Timothy Benitez Date of Birth:   [birth year] Medical Record Number: [id number]  BP 135/70 mmHg  Pulse 86  Temp(Src) 97.8 F (36.6 C) (Oral)   [name] presents for evaluation of recently inserted upper and lower complete dentures. SUBJECTIVE: Patient is complaining of some denture irritation to the mandibular left lingual denture extension. OBJECTIVE: There is slight erythema to the mandibular left lingual extension area #21 as well as buccal extensions of lower left and lower right posterior buccal extensions.  Procedure: Pressure indicating paste was applied to the dentures. Adjustments were made as needed.  Thick PIP to denture borders with adjustments made as needed. [place] Occlusion evaluated and adjustments made as needed for Centric Relation and protrusive strokes. Patient accepts results. Patient to keep dentures out if sore spots develop. Use salt water rinses as needed to aid healing. Return to clinic as scheduled for denture adjustment.   Call if problems arise before then.  Lenn Cal, DDS

## 2015-07-07 NOTE — Patient Instructions (Signed)
Patient to keep dentures out if sore spots develop. Use salt water rinses as needed to aid healing. Return to clinic as scheduled for denture adjustment.   Call if problems arise before then.  Ronald F. Kulinski, DDS  

## 2015-07-23 ENCOUNTER — Telehealth: Payer: Self-pay

## 2015-07-23 ENCOUNTER — Other Ambulatory Visit: Payer: Self-pay | Admitting: Cardiothoracic Surgery

## 2015-07-23 DIAGNOSIS — R918 Other nonspecific abnormal finding of lung field: Secondary | ICD-10-CM

## 2015-07-23 NOTE — Telephone Encounter (Signed)
Pt called had a DOT Physical and received a bill from this visit for over $300. Pt wants to know he is getting a bill after he paid the $95 fee. Please call him ASAP pt stated he works in a [employer] is only available from 12-1pm and after 4:30pm. Thanks.

## 2015-07-24 ENCOUNTER — Ambulatory Visit
Admission: RE | Admit: 2015-07-24 | Discharge: 2015-07-24 | Disposition: A | Discharge status: Home / Self Care | Payer: 59 | Source: Ambulatory Visit | Attending: Cardiothoracic Surgery | Admitting: Cardiothoracic Surgery

## 2015-07-24 ENCOUNTER — Ambulatory Visit: Payer: Self-pay | Admitting: Cardiothoracic Surgery

## 2015-07-24 ENCOUNTER — Ambulatory Visit (INDEPENDENT_AMBULATORY_CARE_PROVIDER_SITE_OTHER): Payer: No Typology Code available for payment source | Admitting: Cardiothoracic Surgery

## 2015-07-24 ENCOUNTER — Encounter: Payer: Self-pay | Admitting: Cardiothoracic Surgery

## 2015-07-24 VITALS — BP 135/70 | HR 72 | Resp 20 | Ht 71.0 in | Wt 214.0 lb

## 2015-07-24 DIAGNOSIS — D1430 Benign neoplasm of unspecified bronchus and lung: Secondary | ICD-10-CM

## 2015-07-24 DIAGNOSIS — Z902 Acquired absence of lung [part of]: Secondary | ICD-10-CM

## 2015-07-24 DIAGNOSIS — R918 Other nonspecific abnormal finding of lung field: Secondary | ICD-10-CM

## 2015-07-24 DIAGNOSIS — D3A00 Benign carcinoid tumor of unspecified site: Secondary | ICD-10-CM

## 2015-07-24 NOTE — Progress Notes (Signed)
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 SURGICAL PROCEDURE: bronchoscopy left video-assisted thoracoscopy, left mini thoracotomy, left upper lobectomy with open suture closure of the left upper lobe bronchial stump, placement of On-Q and lymph node dissection. SURGEON: [name], MD.    History of Present Illness:    This is a 54 year old male e seen in the office today in routine follow-up. Overall he feels remarkably well.  He has returned to work full  time  At a [employer].  He is not having any difficulty with shortness of breath. He is not having any fevers, chills or other constitutional symptoms. He has had no significant difficulties associated with the incision.     Zubrod Score: At the time of surgery this patient's most appropriate activity status/level should be described as: '[x]'$     0    Normal activity, no symptoms '[]'$     1    Restricted in physical strenuous activity but ambulatory, able to do out light work '[]'$     2    Ambulatory and capable of self care, unable to do work activities, up and about                 >50 % of waking hours                                                                                   '[]'$     3    Only limited self care, in bed greater than 50% of waking hours '[]'$     4    Completely disabled, no self care, confined to bed or chair '[]'$     5    Moribund  History  Smoking status  . Former Smoker  . Types: Cigarettes  . Quit date: 09/27/2013  Smokeless tobacco  .  Never Used    Comment: quit smoking in 2015(cig) and cigars(01/29/15)       No Known Allergies  Current Outpatient Prescriptions  Medication Sig Dispense Refill  . acetaminophen (TYLENOL) 500 MG tablet Take 1-2 tablets (500-1,000 mg total) by mouth every 6 (six) hours as needed for mild pain or headache. 30 tablet 0  . albuterol (PROVENTIL HFA;VENTOLIN HFA) 108 (90 BASE) MCG/ACT inhaler Inhale 1-2 puffs into the lungs every 6 (six) hours as needed for wheezing or shortness of breath.    Marland Kitchen atorvastatin (LIPITOR) 20 MG tablet Take 20 mg by mouth every evening.     . Cholecalciferol (VITAMIN D) 2000 UNITS CAPS Take 1 capsule by mouth daily.    . SYMBICORT 160-4.5 MCG/ACT inhaler TAKE TWO PUFFS TWICE DAILY. 10.2  g 0   No current facility-administered medications for this visit.       Physical Exam: BP 135/70 mmHg  Pulse 72  Resp 20  Ht '5\' 11"'$  (1.803 m)  Wt 214 lb (97.07 kg)  BMI 29.86 kg/m2  SpO2 98%  General appearance: alert, cooperative and no distress Heart: regular rate and rhythm Lungs: clear to auscultation bilaterally Wound: incis healing well   Diagnostic Studies & Laboratory data:         Recent Radiology Findings: Dg Chest 2 View  07/24/2015  CLINICAL DATA:  Shortness of breath with exertion. History of previous lung carcinoma EXAM: CHEST  2 VIEW COMPARISON:  April 14, 2015 FINDINGS: Volume loss on the left is consistent with previous surgery. Currently no mass is seen. No edema or consolidation. The heart size and pulmonary vascularity are normal. No adenopathy. There is stable anterior wedging of a mid thoracic vertebral body. There are no blastic or lytic bone lesions. IMPRESSION: No edema or consolidation. Volume loss on the left. No mass or adenopathy appreciable. Stable wedging mid thoracic vertebral body. Electronically Signed   By: Lowella Grip III M.D.   On: 07/24/2015 09:05      I have independently reviewed the above radiology findings and reviewed  findings  with the patient.  Recent Labs: Lab Results  Component Value Date   WBC 10.2 02/20/2015   HGB 11.7* 02/20/2015   HCT 35.1* 02/20/2015   PLT 172 02/20/2015   GLUCOSE 97 02/20/2015   ALT 32 02/19/2015   AST 36 02/19/2015   NA 139 02/20/2015   K 4.1 02/20/2015   CL 101 02/20/2015   CREATININE 0.88 02/20/2015   BUN 10 02/20/2015   CO2 30 02/20/2015   INR 0.94 02/13/2015      Assessment / Plan:      Patient is doingwell following his resection of carcinoid tumor. We will see him again in approximately 3 months with a repeat repeat ct of the chest 55month post op. He will not require adjuvant therapy for the carcinoid tumor, [facility]/08/2014 9:44 AM

## 2015-07-31 ENCOUNTER — Ambulatory Visit: Payer: Self-pay | Admitting: Cardiothoracic Surgery

## 2015-08-06 ENCOUNTER — Other Ambulatory Visit: Payer: Self-pay | Admitting: Unknown Physician Specialty

## 2015-08-06 ENCOUNTER — Encounter (HOSPITAL_COMMUNITY): Payer: Self-pay | Admitting: Dentistry

## 2015-08-06 ENCOUNTER — Ambulatory Visit (HOSPITAL_COMMUNITY): Payer: Self-pay | Admitting: Dentistry

## 2015-08-06 VITALS — BP 140/65 | HR 70 | Temp 98.0°F

## 2015-08-06 DIAGNOSIS — Z463 Encounter for fitting and adjustment of dental prosthetic device: Secondary | ICD-10-CM

## 2015-08-06 DIAGNOSIS — D1430 Benign neoplasm of unspecified bronchus and lung: Secondary | ICD-10-CM

## 2015-08-06 DIAGNOSIS — K0820 Unspecified atrophy of edentulous alveolar ridge: Secondary | ICD-10-CM

## 2015-08-06 DIAGNOSIS — K08109 Complete loss of teeth, unspecified cause, unspecified class: Secondary | ICD-10-CM

## 2015-08-06 DIAGNOSIS — Z972 Presence of dental prosthetic device (complete) (partial): Secondary | ICD-10-CM

## 2015-08-06 NOTE — Patient Instructions (Signed)
Patient to keep dentures out if sore spots develop. Use salt water rinses as needed to aid healing. Return to clinic as scheduled for denture adjustment.   Call if problems arise before then.  Ronald F. Kulinski, DDS  

## 2015-08-06 NOTE — Telephone Encounter (Signed)
Pt needs check further refills

## 2015-08-06 NOTE — Progress Notes (Signed)
08/06/2015  Patient Name:   Timothy Benitez Date of Birth:   [birth year] Medical Record Number: [id number]  BP 140/65 mmHg  Pulse 70  Temp(Src) 98 F (36.7 C) (Oral)   [name] presents for evaluation of upper and lower complete dentures. SUBJECTIVE: Patient is complaining of some denture irritation to the mandibular left lingual denture extension. OBJECTIVE: There is no evidence of denture irritation noted.  Procedure: Pressure indicating paste was applied to the dentures. Adjustments were made as needed. [place] Occlusion evaluated and adjustments made as needed for Centric Relation and protrusive strokes. Patient accepts results. Patient to keep dentures out if sore spots develop. Use salt water rinses as needed to aid healing. Return to clinic as scheduled for denture adjustment.   Call if problems arise before then.  Lenn Cal, DDS

## 2015-08-26 ENCOUNTER — Telehealth: Payer: Self-pay | Admitting: Unknown Physician Specialty

## 2015-08-26 NOTE — Telephone Encounter (Signed)
Pt called stated he needs his last visit applied to his insurance. Thanks.

## 2015-09-08 ENCOUNTER — Other Ambulatory Visit: Payer: Self-pay

## 2015-09-08 MED ORDER — BUDESONIDE-FORMOTEROL FUMARATE 160-4.5 MCG/ACT IN AERO: 2.0000 | INHALATION_SPRAY | Freq: Two times a day (BID) | RESPIRATORY_TRACT | Status: DC

## 2015-09-08 MED ORDER — ATORVASTATIN CALCIUM 10 MG PO TABS: 10.0000 mg | ORAL_TABLET | Freq: Every day | ORAL | Status: DC

## 2015-09-08 NOTE — Telephone Encounter (Signed)
Patient was seen 06/04/15 and pharmacy is [facility].

## 2015-09-08 NOTE — Telephone Encounter (Signed)
Called and left patient a voicemail asking for him to please return my call to schedule f/u.

## 2015-09-08 NOTE — Telephone Encounter (Signed)
Pt needs seen.

## 2015-09-09 ENCOUNTER — Ambulatory Visit: Payer: No Typology Code available for payment source | Admitting: Unknown Physician Specialty

## 2015-09-09 NOTE — Telephone Encounter (Signed)
Called and left voicemail asking for patient to please return my call.

## 2015-09-09 NOTE — Telephone Encounter (Signed)
Patient scheduled appointment for 10/07/15.

## 2015-10-07 ENCOUNTER — Encounter (HOSPITAL_COMMUNITY): Payer: Self-pay | Admitting: Dentistry

## 2015-10-07 ENCOUNTER — Ambulatory Visit (INDEPENDENT_AMBULATORY_CARE_PROVIDER_SITE_OTHER): Payer: 59 | Admitting: Unknown Physician Specialty

## 2015-10-07 ENCOUNTER — Encounter: Payer: Self-pay | Admitting: Unknown Physician Specialty

## 2015-10-07 VITALS — BP 147/82 | HR 62 | Temp 98.2°F | Ht 70.2 in | Wt 219.0 lb

## 2015-10-07 DIAGNOSIS — Z23 Encounter for immunization: Secondary | ICD-10-CM

## 2015-10-07 DIAGNOSIS — E1169 Type 2 diabetes mellitus with other specified complication: Secondary | ICD-10-CM | POA: Insufficient documentation

## 2015-10-07 DIAGNOSIS — E785 Hyperlipidemia, unspecified: Secondary | ICD-10-CM | POA: Diagnosis not present

## 2015-10-07 DIAGNOSIS — J449 Chronic obstructive pulmonary disease, unspecified: Secondary | ICD-10-CM | POA: Diagnosis not present

## 2015-10-07 MED ORDER — BUDESONIDE-FORMOTEROL FUMARATE 160-4.5 MCG/ACT IN AERO: 2.0000 | INHALATION_SPRAY | Freq: Two times a day (BID) | RESPIRATORY_TRACT | Status: DC

## 2015-10-07 MED ORDER — ATORVASTATIN CALCIUM 20 MG PO TABS: 20.0000 mg | ORAL_TABLET | Freq: Every day | ORAL | Status: DC

## 2015-10-07 NOTE — Assessment & Plan Note (Addendum)
Pt taking 10 mg Atorvastatin over the last month, d/t a pharmacy automated refill. Increased back to 20 mg, daily. Lipid panel and CMP ordered. Will notify pt of results via call or letter. Instructed to f/u in 6 months.

## 2015-10-07 NOTE — Progress Notes (Signed)
BP 147/82 mmHg  Pulse 62  Temp(Src) 98.2 F (36.8 C)  Ht 5' 10.2" (1.783 m)  Wt 219 lb (99.338 kg)  BMI 31.25 kg/m2  SpO2 98%   Subjective:    Patient ID: Timothy Benitez, male    DOB: 07-Jul-1961, 55 y.o.   MRN: [id number]  HPI: Timothy Benitez is a 55 y.o. male  Chief Complaint  Patient presents with  . Hyperlipidemia   Hyperlipidemia:  Pt taking medication w/o difficulty. Pt denies myalgia and headaches. Pt states he eats a fairly normal diet; not particularly restricting foods. Pt denies additional exercise or gym activity. Reports heavy lifting and straineous activity at work, as well [place].   COPD:  Pt report taking medication w/o complications. Pt denies frequent use of rescue Albuterol inhaler; used once in the last 2-3 months. Pt states that he Symbicort has controlled his dyspnea. Pt denies CP or SOB. Pt able to do normal activities without increased SOB on exertion.     Relevant past medical, surgical, family and social history reviewed and updated as indicated. Interim medical history since our last visit reviewed. Allergies and medications reviewed and updated.  Review of Systems  Constitutional: Negative for activity change, appetite change and unexpected weight change.  HENT: Negative.   Eyes: Negative.   Respiratory: Negative for cough, chest tightness, shortness of breath and wheezing.   Cardiovascular: Negative for chest pain, palpitations and leg swelling.  Gastrointestinal: Negative.   Genitourinary: Negative.   Musculoskeletal: Negative for myalgias and arthralgias.  Skin: Negative.   Neurological: Negative for dizziness, weakness, light-headedness and headaches.  Psychiatric/Behavioral: Negative.     Per HPI unless specifically indicated above     Objective:    BP 147/82 mmHg  Pulse 62  Temp(Src) 98.2 F (36.8 C)  Ht 5' 10.2" (1.783 m)  Wt 219 lb (99.338 kg)  BMI 31.25 kg/m2  SpO2 98%  Wt Readings from Last 3  Encounters:  10/07/15 219 lb (99.338 kg)  07/24/15 214 lb (97.07 kg)  06/04/15 214 lb 6.4 oz (97.251 kg)    Physical Exam  Constitutional: He is oriented to person, place, and time. He appears well-developed and well-nourished. No distress.  HENT:  Head: Normocephalic and atraumatic.  Eyes: Conjunctivae and lids are normal. Right eye exhibits no discharge. Left eye exhibits no discharge. No scleral icterus.  Neck: Normal range of motion. Neck supple. No JVD present. Carotid bruit is not present.  Cardiovascular: Normal rate, regular rhythm and normal heart sounds.   Pulmonary/Chest: Effort normal and breath sounds normal. No respiratory distress.  Abdominal: Normal appearance. There is no splenomegaly or hepatomegaly.  Musculoskeletal: Normal range of motion.  Neurological: He is alert and oriented to person, place, and time.  Skin: Skin is warm, dry and intact. No rash noted. No pallor.  Psychiatric: He has a normal mood and affect. His behavior is normal. Judgment and thought content normal.  Vitals reviewed.   Results for orders placed or performed in visit on 06/04/15  Urinalysis, dipstick only  Result Value Ref Range   Specific Gravity, UA 1.015 1.005 - 1.030   pH, UA 7.0 5.0 - 7.5   Color, UA Yellow Yellow   Appearance Ur Clear Clear   Leukocytes, UA Negative Negative   Protein, UA Negative Negative/Trace   Glucose, UA Negative Negative   Ketones, UA Negative Negative   RBC, UA Negative Negative   Bilirubin, UA Negative Negative   Urobilinogen, Ur 0.2  0.2 - 1.0 mg/dL   Nitrite, UA Negative Negative      Assessment & Plan:   Problem List Items Addressed This Visit      Unprioritized   COPD (chronic obstructive pulmonary disease) ([place])    Pt doing well with COPD medication management. Denies exacerbations and frequent rescue inhaler use. Symbicort prescription refilled and f/u in 6 months.      Relevant Medications   budesonide-formoterol (SYMBICORT) 160-4.5  MCG/ACT inhaler   Hyperlipidemia - Primary    Pt taking 10 mg Atorvastatin over the last month, d/t a pharmacy automated refill. Increased back to 20 mg, daily. Lipid panel and CMP ordered. Will notify pt of results via call or letter. Instructed to f/u in 6 months.       Relevant Medications   atorvastatin (LIPITOR) 20 MG tablet   Other Relevant Orders   Lipid Panel w/o Chol/HDL Ratio   Comprehensive metabolic panel    Other Visit Diagnoses    Need for diphtheria-tetanus-pertussis (Tdap) vaccine, adult/adolescent        Relevant Orders    Tdap vaccine greater than or equal to 7yo IM (Completed)        Follow up plan: Return in about 6 months (around 04/05/2016).

## 2015-10-07 NOTE — Assessment & Plan Note (Signed)
Pt doing well with COPD medication management. Denies exacerbations and frequent rescue inhaler use. Symbicort prescription refilled and f/u in 6 months.

## 2015-10-08 ENCOUNTER — Encounter: Payer: Self-pay | Admitting: Unknown Physician Specialty

## 2015-10-08 LAB — COMPREHENSIVE METABOLIC PANEL
A/G RATIO: 2 (ref 1.1–2.5)
ALBUMIN: 4.2 g/dL (ref 3.5–5.5)
ALT: 27 IU/L (ref 0–44)
AST: 18 IU/L (ref 0–40)
Alkaline Phosphatase: 68 IU/L (ref 39–117)
BILIRUBIN TOTAL: 0.6 mg/dL (ref 0.0–1.2)
BUN / CREAT RATIO: 15 (ref 9–20)
BUN: 12 mg/dL (ref 6–24)
CHLORIDE: 98 mmol/L (ref 96–106)
CO2: 27 mmol/L (ref 18–29)
Calcium: 9.3 mg/dL (ref 8.7–10.2)
Creatinine, Ser: 0.79 mg/dL (ref 0.76–1.27)
GFR calc non Af Amer: 103 mL/min/{1.73_m2} (ref >59)
GFR, EST AFRICAN AMERICAN: 119 mL/min/{1.73_m2} (ref >59)
GLOBULIN, TOTAL: 2.1 g/dL (ref 1.5–4.5)
Glucose: 115 mg/dL — ABNORMAL HIGH (ref 65–99)
POTASSIUM: 4.8 mmol/L (ref 3.5–5.2)
Sodium: 138 mmol/L (ref 134–144)
TOTAL PROTEIN: 6.3 g/dL (ref 6.0–8.5)

## 2015-10-08 LAB — LIPID PANEL W/O CHOL/HDL RATIO
CHOLESTEROL TOTAL: 197 mg/dL (ref 100–199)
HDL: 56 mg/dL (ref >39)
LDL Calculated: 101 mg/dL — ABNORMAL HIGH (ref 0–99)
TRIGLYCERIDES: 198 mg/dL — AB (ref 0–149)
VLDL Cholesterol Cal: 40 mg/dL (ref 5–40)

## 2015-10-14 ENCOUNTER — Encounter (HOSPITAL_COMMUNITY): Payer: Self-pay | Admitting: Dentistry

## 2015-10-14 ENCOUNTER — Ambulatory Visit (HOSPITAL_COMMUNITY): Payer: Self-pay | Admitting: Dentistry

## 2015-10-14 VITALS — BP 128/70 | HR 87 | Temp 98.0°F

## 2015-10-14 DIAGNOSIS — Z972 Presence of dental prosthetic device (complete) (partial): Secondary | ICD-10-CM

## 2015-10-14 DIAGNOSIS — K0820 Unspecified atrophy of edentulous alveolar ridge: Secondary | ICD-10-CM

## 2015-10-14 DIAGNOSIS — K08109 Complete loss of teeth, unspecified cause, unspecified class: Secondary | ICD-10-CM

## 2015-10-14 DIAGNOSIS — Z463 Encounter for fitting and adjustment of dental prosthetic device: Secondary | ICD-10-CM

## 2015-10-14 DIAGNOSIS — D1430 Benign neoplasm of unspecified bronchus and lung: Secondary | ICD-10-CM

## 2015-10-14 NOTE — Progress Notes (Signed)
10/14/2015  Patient Name:   Timothy Benitez Date of Birth:   04/17/1961 Medical Record Number: [id number]  BP 128/70 mmHg  Pulse 87  Temp(Src) 98 F (36.7 C) (Oral)   Timothy Benitez presents for evaluation of upper and lower complete dentures. SUBJECTIVE: Patient is NOT complaining of any denture irritation. OBJECTIVE: There is no evidence of denture irritation noted.  Procedure: Pressure indicating paste was applied to the dentures. Adjustments were made as needed. [place] Occlusion evaluated and adjustments made as needed for Centric Relation and protrusive strokes. Patient has class II set up with dropping of premolar. Occlusion is very good, however. Patient accepts results. Patient to keep dentures out if sore spots develop. Use salt water rinses as needed to aid healing. Return to clinic as scheduled for denture adjustment.   Call if problems arise before then.  Lenn Cal, DDS

## 2015-10-14 NOTE — Patient Instructions (Signed)
Patient to keep dentures out if sore spots develop. Use salt water rinses as needed to aid healing. Return to clinic as scheduled for denture adjustment.   Call if problems arise before then.  Timothy Benitez F. Shadiamond Koska, DDS  

## 2015-10-16 ENCOUNTER — Other Ambulatory Visit: Payer: Self-pay | Admitting: *Deleted

## 2015-10-16 DIAGNOSIS — C3412 Malignant neoplasm of upper lobe, left bronchus or lung: Secondary | ICD-10-CM

## 2015-11-06 ENCOUNTER — Ambulatory Visit (INDEPENDENT_AMBULATORY_CARE_PROVIDER_SITE_OTHER): Payer: 59 | Admitting: Cardiothoracic Surgery

## 2015-11-06 ENCOUNTER — Ambulatory Visit
Admission: RE | Admit: 2015-11-06 | Discharge: 2015-11-06 | Disposition: A | Discharge status: Home / Self Care | Payer: 59 | Source: Ambulatory Visit | Attending: Cardiothoracic Surgery | Admitting: Cardiothoracic Surgery

## 2015-11-06 ENCOUNTER — Encounter: Payer: Self-pay | Admitting: Cardiothoracic Surgery

## 2015-11-06 VITALS — BP 126/68 | HR 69 | Resp 20 | Ht 70.5 in | Wt 219.0 lb

## 2015-11-06 DIAGNOSIS — D3A00 Benign carcinoid tumor of unspecified site: Secondary | ICD-10-CM

## 2015-11-06 DIAGNOSIS — C3412 Malignant neoplasm of upper lobe, left bronchus or lung: Secondary | ICD-10-CM

## 2015-11-06 NOTE — Progress Notes (Signed)
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 SURGICAL PROCEDURE: bronchoscopy left video-assisted thoracoscopy, left mini thoracotomy, left upper lobectomy with open suture closure of the left upper lobe bronchial stump, placement of On-Q and lymph node dissection. SURGEON: [name], MD.    History of Present Illness:    This is a 55 year old male e seen in the office today in routine follow-up. Overall he feels remarkably well.  He has returned to work full  time  At a [employer].  He is not having any difficulty with shortness of breath. He is not having any fevers, chills or other constitutional symptoms. He has had no significant difficulties associated with the incision.     Zubrod Score: At the time of surgery this patient's most appropriate activity status/level should be described as: '[x]'$     0    Normal activity, no symptoms '[]'$     1    Restricted in physical strenuous activity but ambulatory, able to do out light work '[]'$     2    Ambulatory and capable of self care, unable to do work activities, up and about                 >50 % of waking hours                                                                                   '[]'$     3    Only limited self care, in bed greater than 50% of waking hours '[]'$     4    Completely disabled, no self care, confined to bed or chair '[]'$     5    Moribund  History  Smoking status  . Former Smoker  . Types: Cigarettes  . Quit date: 09/27/2013  Smokeless tobacco  .  Never Used    Comment: quit smoking in 2015(cig) and cigars(01/29/15)       No Known Allergies  Current Outpatient Prescriptions  Medication Sig Dispense Refill  . albuterol (PROVENTIL HFA;VENTOLIN HFA) 108 (90 BASE) MCG/ACT inhaler Inhale 1-2 puffs into the lungs every 6 (six) hours as needed for wheezing or shortness of breath.    Marland Kitchen atorvastatin (LIPITOR) 20 MG tablet Take 1 tablet (20 mg total) by mouth daily. 90 tablet 1  . budesonide-formoterol (SYMBICORT) 160-4.5 MCG/ACT inhaler Inhale 2 puffs into the lungs 2 (two) times daily. 3 Inhaler 12  . Cholecalciferol (VITAMIN D) 2000 UNITS CAPS Take 1 capsule by mouth daily.    . Potassium 95 MG TABS Take 95 mg by mouth daily.     No current facility-administered  medications for this visit.       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 COMPARISON:  12/20/2014 FINDINGS: Mediastinum/Nodes: The heart is normal in size. No pericardial effusion. Mild atherosclerotic calcifications of the aortic arch. Small mediastinal lymph nodes including a 6 mm short axis prevascular node and a 7 mm short axis low right paratracheal node, within normal limits. Visualized thyroid is unremarkable. Lungs/Pleura: Status post left upper lobectomy. Lungs are essentially clear. No suspicious pulmonary nodules. Mild emphysematous changes. No focal consolidation. No pleural effusion or  pneumothorax. Upper abdomen: Visualized upper abdomen is unremarkable. Musculoskeletal: 4.4 cm sebaceous cyst in the right anterior chest wall (series 3/image 26). Degenerative changes of the visualized thoracolumbar spine. IMPRESSION: Status post left upper lobectomy. No findings suspicious for recurrent metastatic disease. Small mediastinal lymph nodes measuring up to 6-7 mm, within normal limits. Electronically Signed   By: Julian Hy M.D.   On: 11/06/2015 11:48      I have independently reviewed the above radiology findings and reviewed findings  with the patient.  Recent Labs: Lab Results  Component Value Date   WBC 10.2 02/20/2015   HGB 11.7* 02/20/2015   HCT 35.1* 02/20/2015   PLT 172 02/20/2015   GLUCOSE 115* 10/07/2015   CHOL 197 10/07/2015   TRIG 198* 10/07/2015   HDL 56 10/07/2015   LDLCALC 101* 10/07/2015   ALT 27 10/07/2015   AST 18 10/07/2015   NA 138 10/07/2015   K 4.8 10/07/2015   CL 98 10/07/2015   CREATININE 0.79 10/07/2015   BUN 12 10/07/2015   CO2 27 10/07/2015   INR 0.94 02/13/2015      Assessment / Plan:      Patient is doingwell following his resection of carcinoid tumor. We will see him again in approximately 6 months with a  Chest xray    Timothy Benitez 11/06/2015 2:19 PM

## 2015-12-25 IMAGING — CR DG CHEST 1V PORT
1 series · 1 of 1 positions shown · non-contrast
Comparison: 02/17/2015

CLINICAL DATA: Thoracotomy.  Pneumothorax.

EXAM:
PORTABLE CHEST - 1 VIEW

[AP]
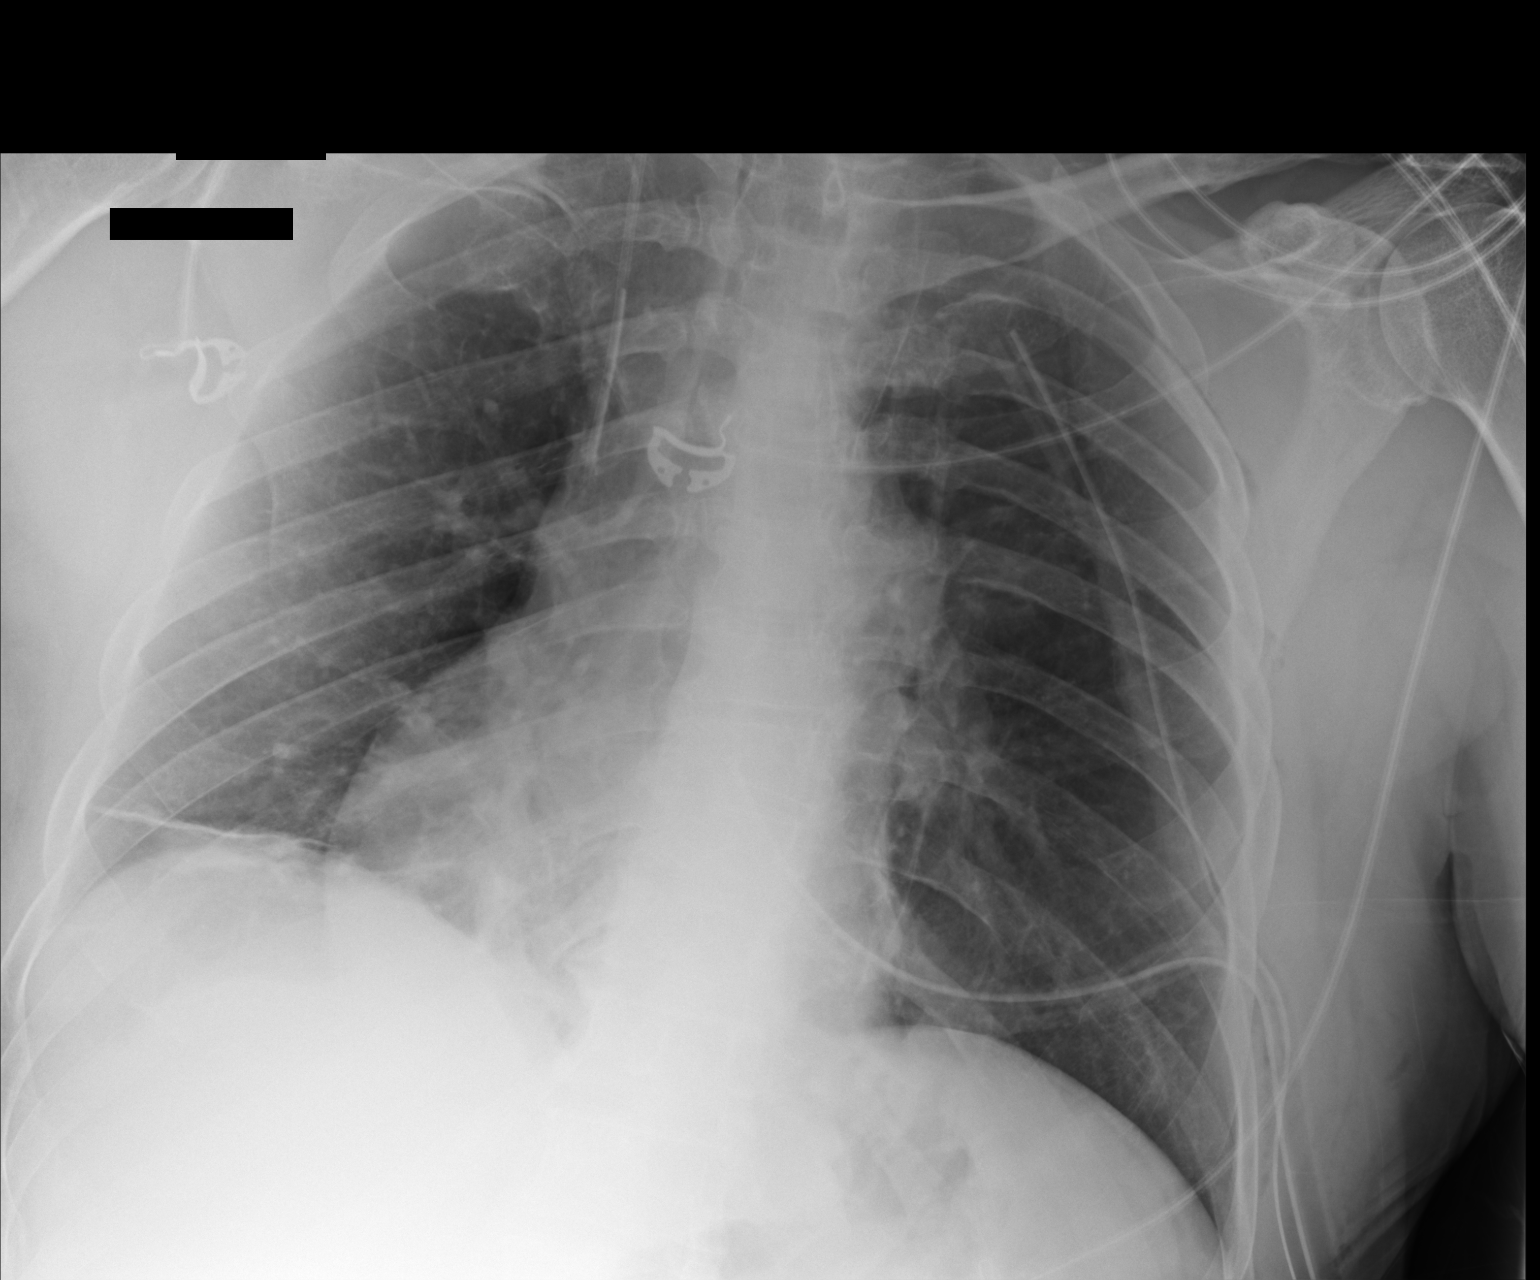

[1 of 1 positions shown; findings below may reference images not displayed]

FINDINGS: Two left chest tubes appear unchanged in position. The right jugular
central line extends into the SVC. No pneumothorax is evident. There
is mild linear scarring or atelectasis in the bases, unchanged.
IMPRESSION: Support equipment appears satisfactorily positioned. No
pneumothorax. Minimal linear basilar opacities.

## 2015-12-28 IMAGING — CR DG CHEST 2V
2 series · 2 of 2 positions shown · non-contrast
Comparison: Portable chest x-ray February 20, 2015

CLINICAL DATA: Pneumothorax, recent chest tube removal

EXAM:
CHEST  2 VIEW

[chest pa]
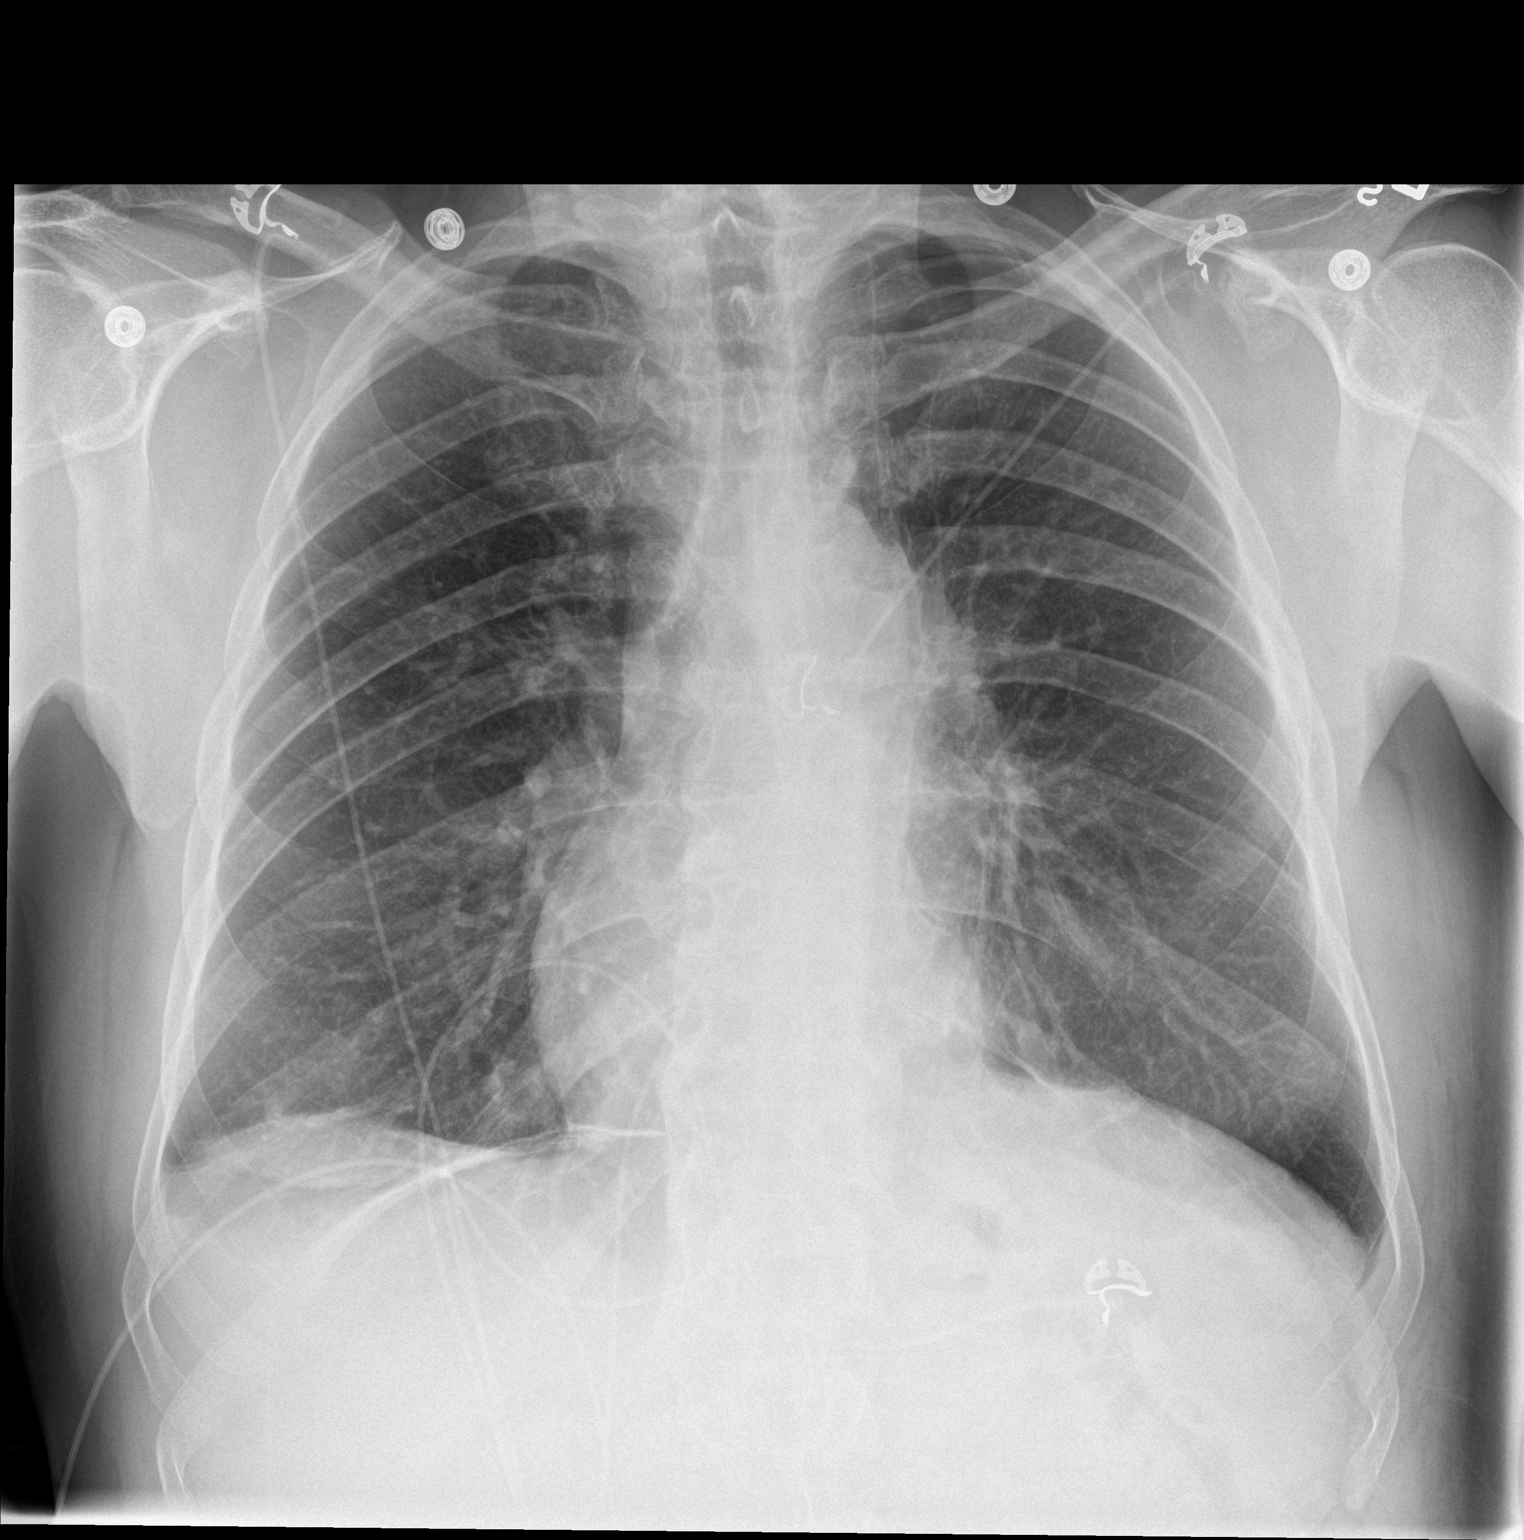

[chest lat]
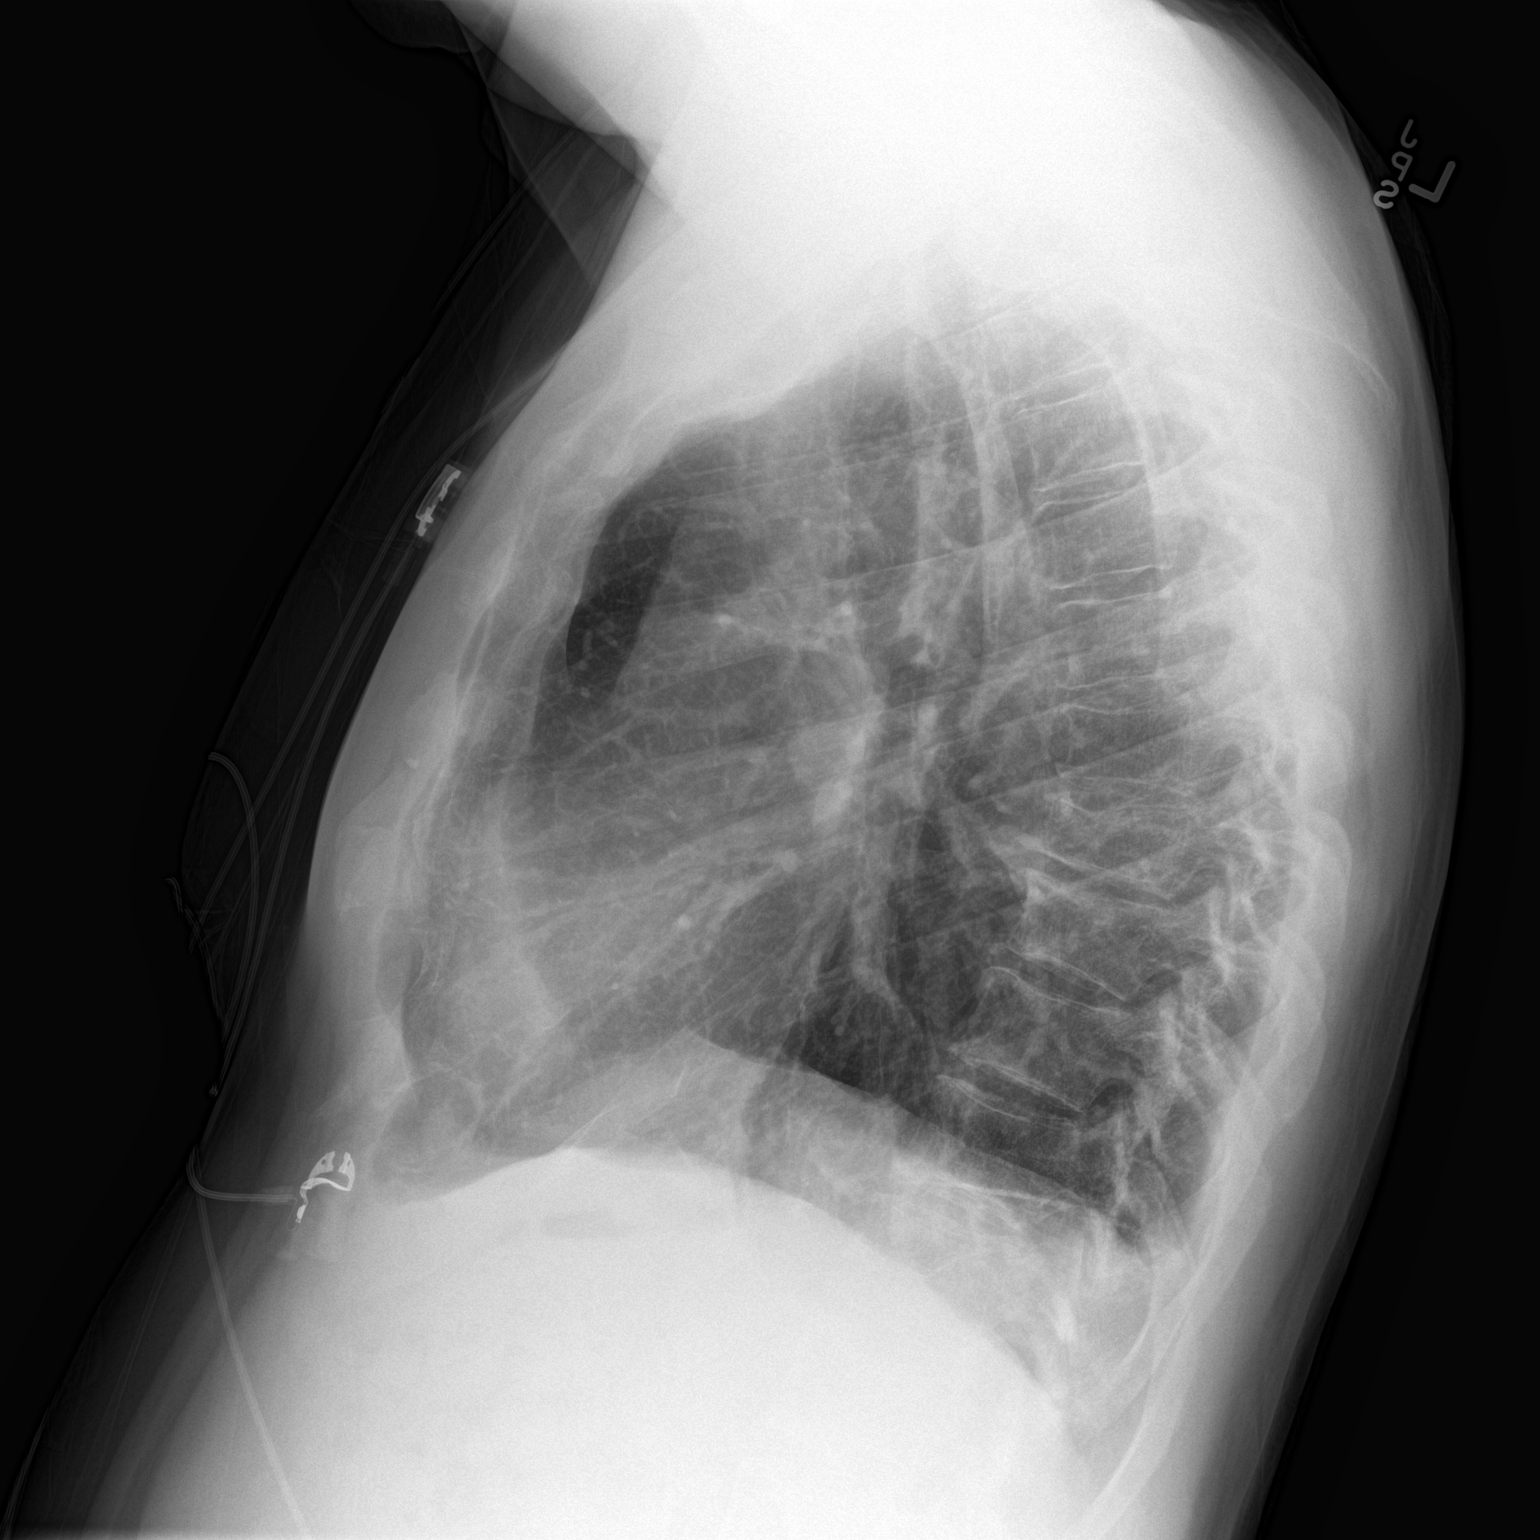

[2 of 2 positions shown; findings below may reference images not displayed]

FINDINGS: There has been interval removal of the remaining left chest tube.
There is no pneumothorax. Left there is a trace of pleural fluid
blunting the left lateral and posterior costophrenic angles. There
is stable subsegmental right basilar atelectasis. The heart is
normal in size. The pulmonary vascularity is not engorged. The right
internal jugular venous catheter has been removed. There is stable
mild compression of the T9 vertebral body.
IMPRESSION: 1. No significant pleural effusion and no pneumothorax on the left
following removal of the chest tube.
2. There is subsegmental atelectasis at the right lung base.

## 2016-01-17 IMAGING — CR DG CHEST 2V
2 series · 2 of 2 positions shown · non-contrast
Comparison: 02/21/2015.

CLINICAL DATA: Left chest pain since a left upper lobectomy on
02/17/2015. Ex-smoker.

EXAM:
CHEST  2 VIEW

[w chest pa]
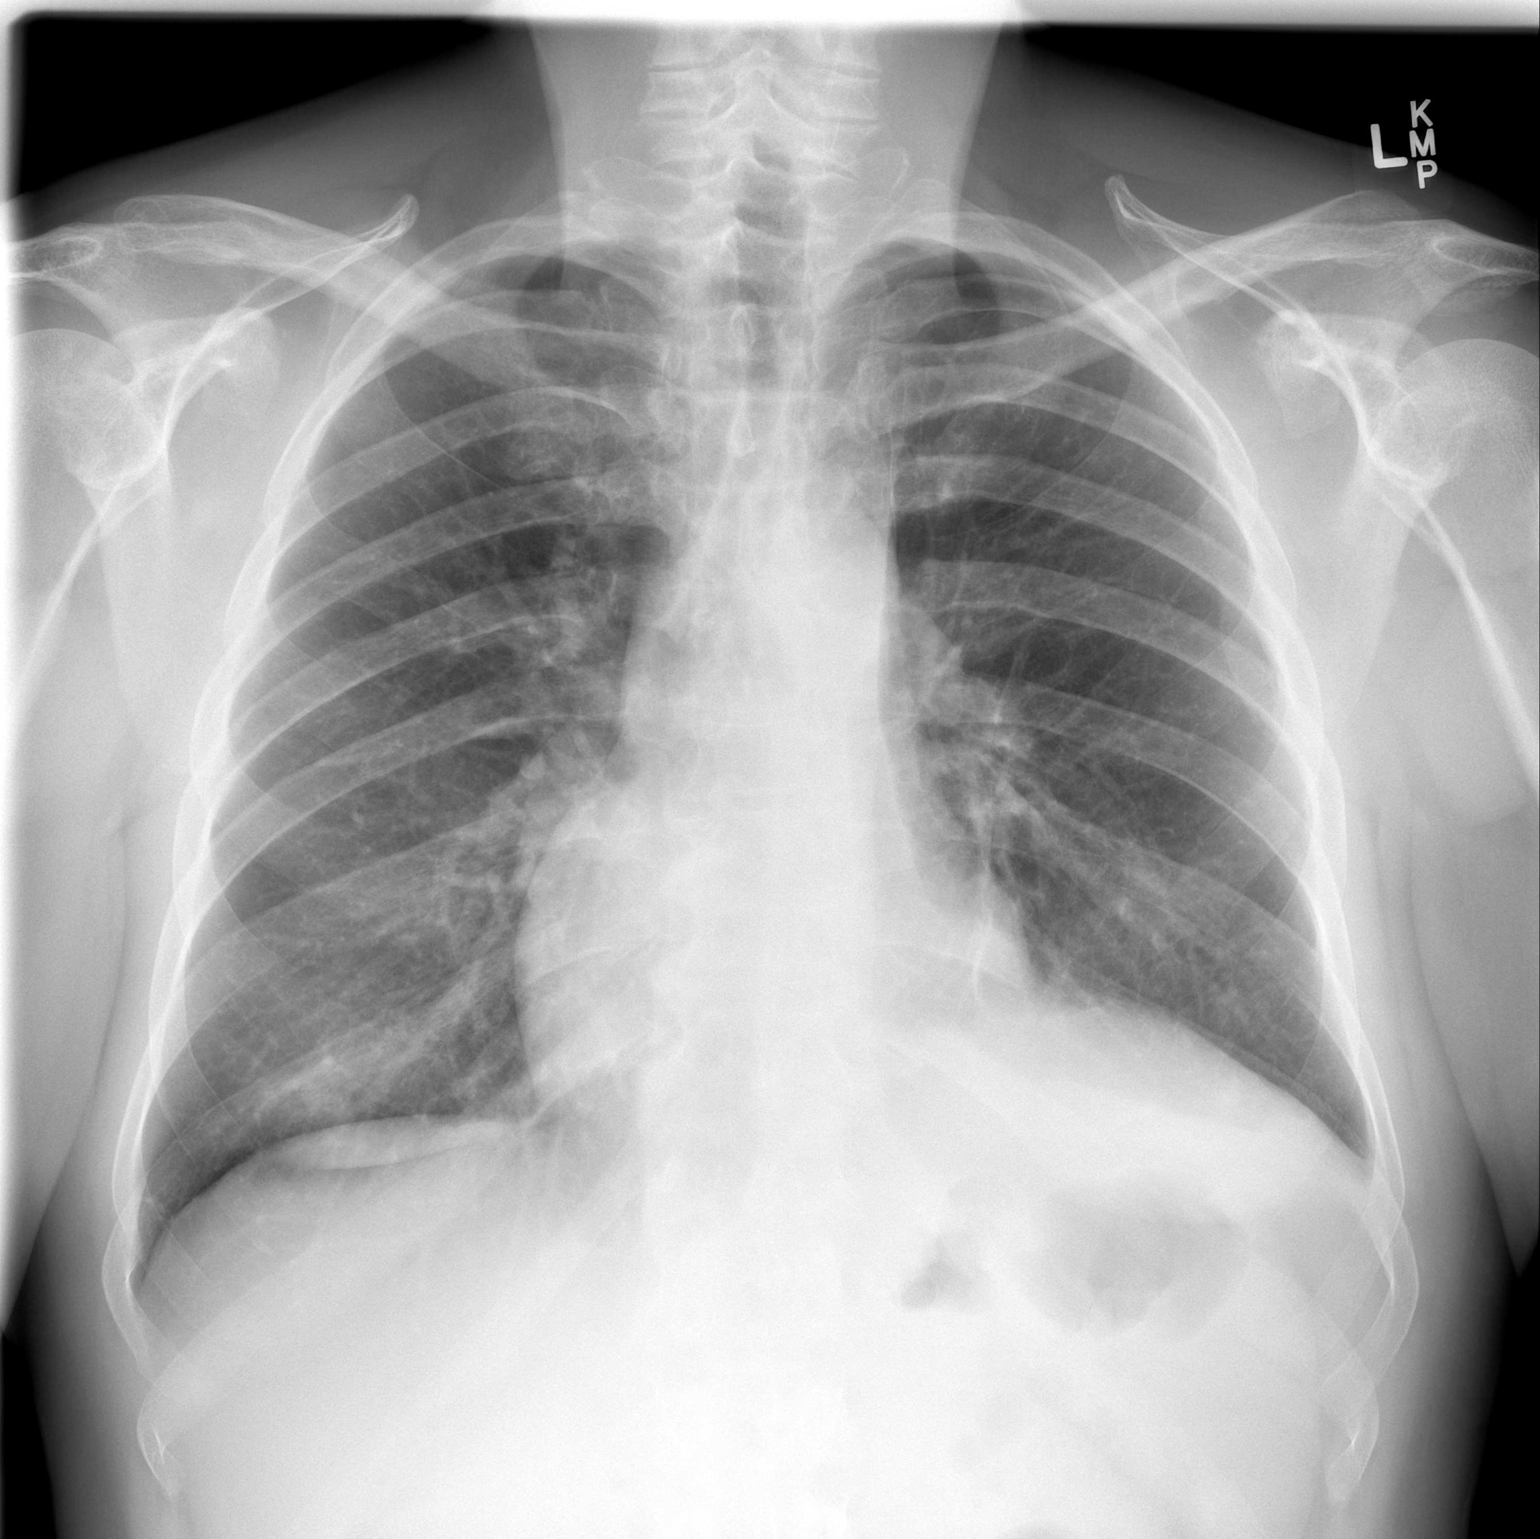

[w chest lat]
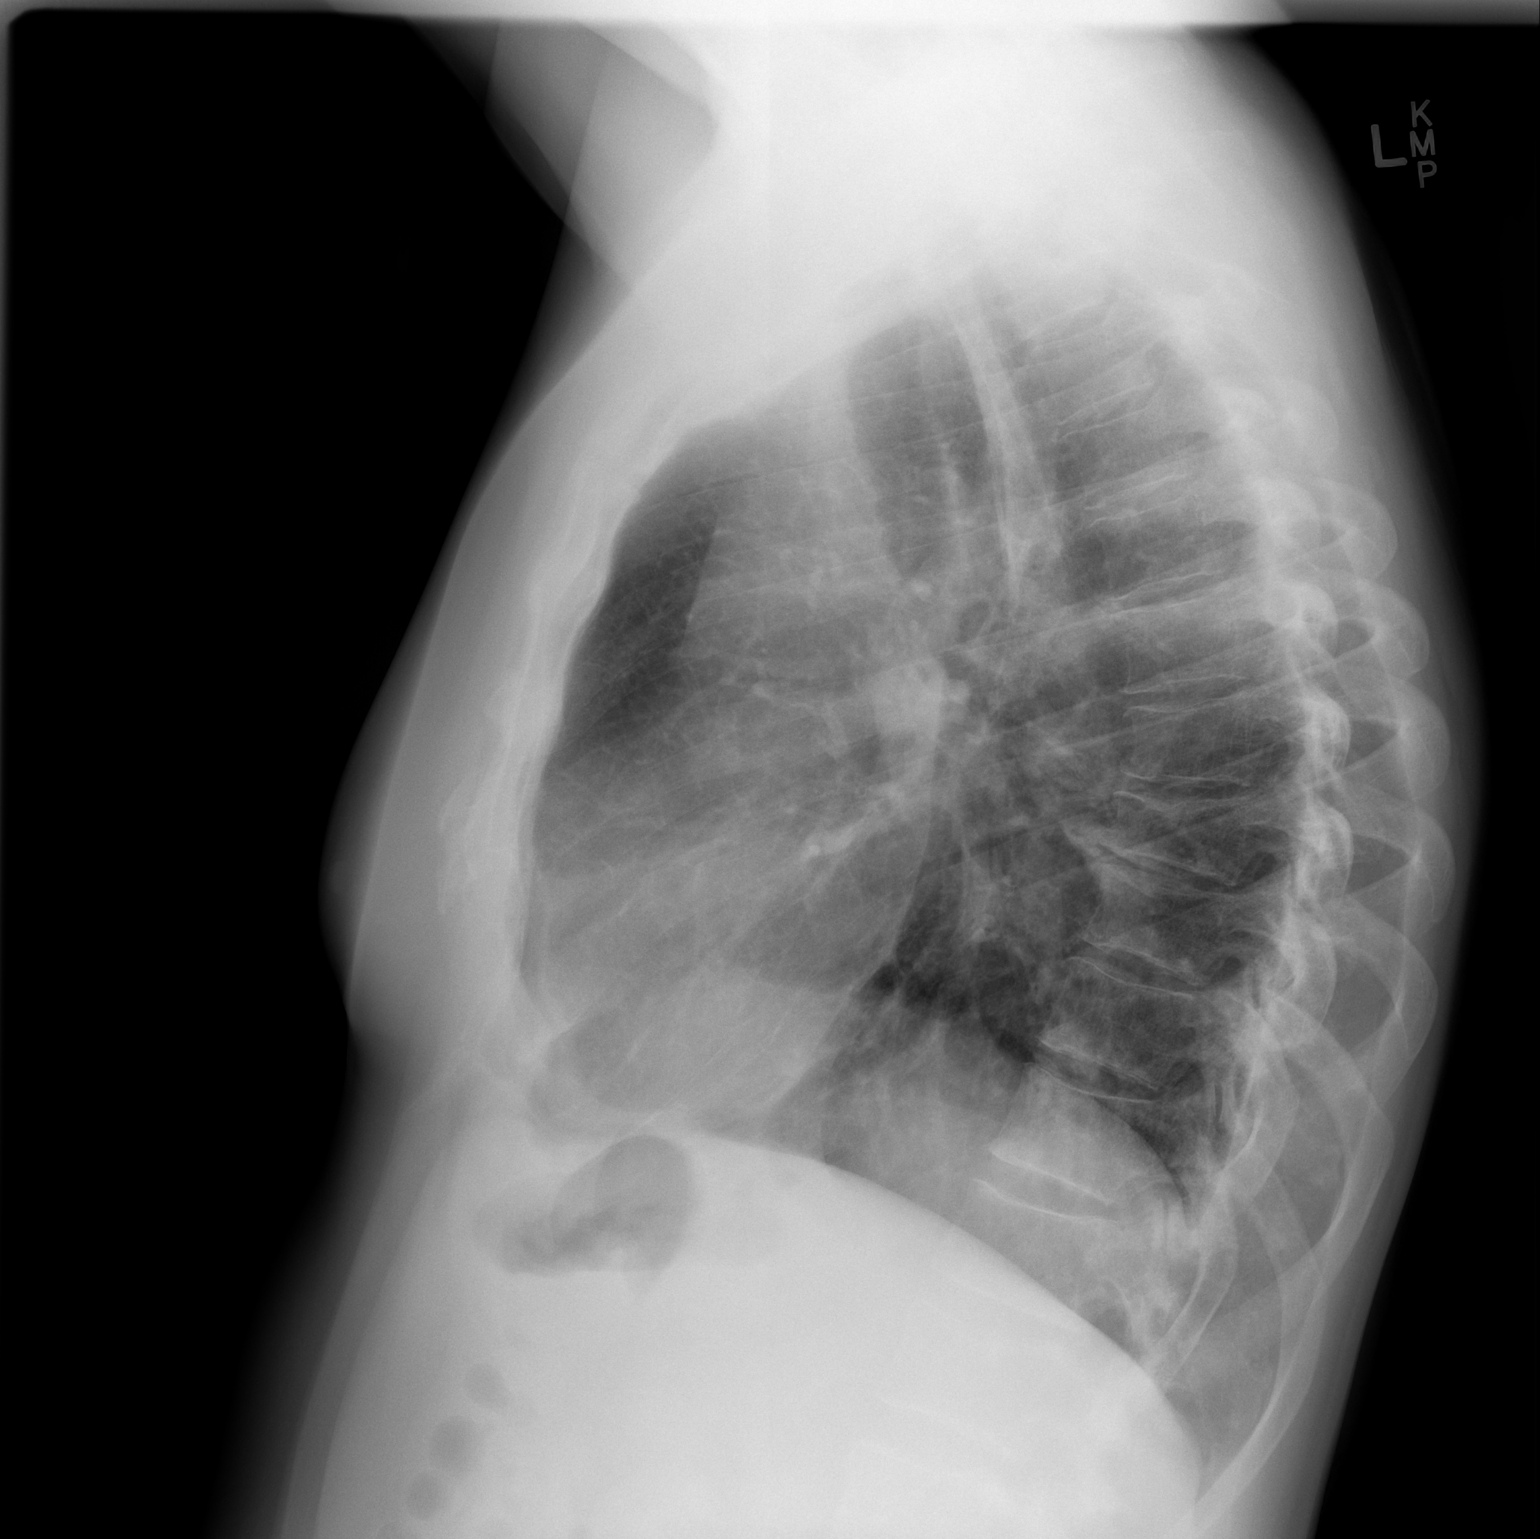

[2 of 2 positions shown; findings below may reference images not displayed]

FINDINGS: Normal sized heart. Small amount of residual linear density at the
left lung base. Stable elevation of the left hemidiaphragm. Clear
right lung. Thoracic spine degenerative changes.
IMPRESSION: No acute abnormality.  Minimal left basilar linear scarring.

## 2016-04-06 ENCOUNTER — Ambulatory Visit (INDEPENDENT_AMBULATORY_CARE_PROVIDER_SITE_OTHER): Payer: 59 | Admitting: Unknown Physician Specialty

## 2016-04-06 ENCOUNTER — Encounter: Payer: Self-pay | Admitting: Unknown Physician Specialty

## 2016-04-06 VITALS — BP 137/82 | HR 66 | Temp 97.9°F | Ht 71.5 in | Wt 217.0 lb

## 2016-04-06 DIAGNOSIS — Z0000 Encounter for general adult medical examination without abnormal findings: Secondary | ICD-10-CM | POA: Diagnosis not present

## 2016-04-06 DIAGNOSIS — E785 Hyperlipidemia, unspecified: Secondary | ICD-10-CM | POA: Diagnosis not present

## 2016-04-06 DIAGNOSIS — J449 Chronic obstructive pulmonary disease, unspecified: Secondary | ICD-10-CM | POA: Diagnosis not present

## 2016-04-06 MED ORDER — ALBUTEROL SULFATE HFA 108 (90 BASE) MCG/ACT IN AERS: 1.0000 | INHALATION_SPRAY | Freq: Four times a day (QID) | RESPIRATORY_TRACT | 2 refills | Status: DC | PRN

## 2016-04-06 MED ORDER — BUDESONIDE-FORMOTEROL FUMARATE 160-4.5 MCG/ACT IN AERO: 2.0000 | INHALATION_SPRAY | Freq: Two times a day (BID) | RESPIRATORY_TRACT | 12 refills | Status: DC

## 2016-04-06 MED ORDER — ATORVASTATIN CALCIUM 20 MG PO TABS: 20.0000 mg | ORAL_TABLET | Freq: Every day | ORAL | 1 refills | Status: DC

## 2016-04-06 NOTE — Assessment & Plan Note (Signed)
Stable, continue present medications.   

## 2016-04-06 NOTE — Progress Notes (Signed)
BP 137/82 (BP Location: Left Arm, Patient Position: Sitting, Cuff Size: Large)   Pulse 66   Temp 97.9 F (36.6 C)   Ht 5' 11.5" (1.816 m)   Wt 217 lb (98.4 kg)   SpO2 95%   BMI 29.84 kg/m    Subjective:    Patient ID: [name], male    DOB: 02/09/1961, 55 y.o.   MRN: [id number]  HPI: KEMONI ORTEGA is a 55 y.o. male  Chief Complaint  Patient presents with  . Hyperlipidemia  . COPD  . Labs Only    Hep C and HIV orders entered   Hyperlipidemia:  Pt taking medication w/o difficulty. Pt denies myalgia and headaches. Pt states he eats a fairly normal diet; not particularly restricting foods. Pt denies additional exercise or gym activity. Reports heavy lifting and straineous activity at work, as well [place].   COPD:  Pt report taking medication w/o complications. Pt denies frequent use of rescue Albuterol inhaler; used once in the last 2-3 months. Pt states that he Symbicort has controlled his dyspnea. Pt denies CP or SOB. Pt able to do normal activities without increased SOB on exertion.   Relevant past medical, surgical, family and social history reviewed and updated as indicated. Interim medical history since our last visit reviewed. Allergies and medications reviewed and updated.  Review of Systems  Per HPI unless specifically indicated above     Objective:    BP 137/82 (BP Location: Left Arm, Patient Position: Sitting, Cuff Size: Large)   Pulse 66   Temp 97.9 F (36.6 C)   Ht 5' 11.5" (1.816 m)   Wt 217 lb (98.4 kg)   SpO2 95%   BMI 29.84 kg/m   Wt Readings from Last 3 Encounters:  04/06/16 217 lb (98.4 kg)  11/06/15 219 lb (99.3 kg)  10/07/15 219 lb (99.3 kg)    Physical Exam  Constitutional: He is oriented to person, place, and time. He appears well-developed and well-nourished. No distress.  HENT:  Head: Normocephalic and atraumatic.  Eyes: Conjunctivae and lids are normal. Right eye exhibits no discharge. Left eye exhibits no  discharge. No scleral icterus.  Neck: Normal range of motion. Neck supple. No JVD present. Carotid bruit is not present.  Cardiovascular: Normal rate, regular rhythm and normal heart sounds.   Pulmonary/Chest: Effort normal and breath sounds normal. No respiratory distress.  Abdominal: Normal appearance. There is no splenomegaly or hepatomegaly.  Musculoskeletal: Normal range of motion.  Neurological: He is alert and oriented to person, place, and time.  Skin: Skin is warm, dry and intact. No rash noted. No pallor.  Psychiatric: He has a normal mood and affect. His behavior is normal. Judgment and thought content normal.    Results for orders placed or performed in visit on 10/07/15  Lipid Panel w/o Chol/HDL Ratio  Result Value Ref Range   Cholesterol, Total 197 100 - 199 mg/dL   Triglycerides 198 (H) 0 - 149 mg/dL   HDL 56 >39 mg/dL   VLDL Cholesterol Cal 40 5 - 40 mg/dL   LDL Calculated 101 (H) 0 - 99 mg/dL  Comprehensive metabolic panel  Result Value Ref Range   Glucose 115 (H) 65 - 99 mg/dL   BUN 12 6 - 24 mg/dL   Creatinine, Ser 0.79 0.76 - 1.27 mg/dL   GFR calc non Af Amer 103 >59 mL/min/1.73   GFR calc Af Amer 119 >59 mL/min/1.73   BUN/Creatinine Ratio 15  9 - 20   Sodium 138 134 - 144 mmol/L   Potassium 4.8 3.5 - 5.2 mmol/L   Chloride 98 96 - 106 mmol/L   CO2 27 18 - 29 mmol/L   Calcium 9.3 8.7 - 10.2 mg/dL   Total Protein 6.3 6.0 - 8.5 g/dL   Albumin 4.2 3.5 - 5.5 g/dL   Globulin, Total 2.1 1.5 - 4.5 g/dL   Albumin/Globulin Ratio 2.0 1.1 - 2.5   Bilirubin Total 0.6 0.0 - 1.2 mg/dL   Alkaline Phosphatase 68 39 - 117 IU/L   AST 18 0 - 40 IU/L   ALT 27 0 - 44 IU/L      Assessment & Plan:   Problem List Items Addressed This Visit      Unprioritized   COPD (chronic obstructive pulmonary disease) (HCC)    Stable, continue present medications.        Relevant Medications   albuterol (PROVENTIL HFA;VENTOLIN HFA) 108 (90 Base) MCG/ACT inhaler    budesonide-formoterol (SYMBICORT) 160-4.5 MCG/ACT inhaler   Hyperlipidemia    Check labs      Relevant Medications   atorvastatin (LIPITOR) 20 MG tablet   Other Relevant Orders   Comprehensive metabolic panel   Lipid Panel w/o Chol/HDL Ratio    Other Visit Diagnoses    Health care maintenance    -  Primary   Relevant Orders   Hepatitis C antibody   HIV antibody       Follow up plan: Return in about 6 months (around 10/07/2016) for physical.

## 2016-04-06 NOTE — Assessment & Plan Note (Signed)
Check labs 

## 2016-04-07 LAB — LIPID PANEL W/O CHOL/HDL RATIO
CHOLESTEROL TOTAL: 184 mg/dL (ref 100–199)
HDL: 49 mg/dL (ref >39)
LDL CALC: 111 mg/dL — AB (ref 0–99)
Triglycerides: 118 mg/dL (ref 0–149)
VLDL Cholesterol Cal: 24 mg/dL (ref 5–40)

## 2016-04-07 LAB — COMPREHENSIVE METABOLIC PANEL
A/G RATIO: 2.3 — AB (ref 1.2–2.2)
ALBUMIN: 4.1 g/dL (ref 3.5–5.5)
ALK PHOS: 71 IU/L (ref 39–117)
ALT: 37 IU/L (ref 0–44)
AST: 20 IU/L (ref 0–40)
BILIRUBIN TOTAL: 0.9 mg/dL (ref 0.0–1.2)
BUN / CREAT RATIO: 20 (ref 9–20)
BUN: 19 mg/dL (ref 6–24)
CO2: 26 mmol/L (ref 18–29)
CREATININE: 0.95 mg/dL (ref 0.76–1.27)
Calcium: 8.9 mg/dL (ref 8.7–10.2)
Chloride: 99 mmol/L (ref 96–106)
GFR calc Af Amer: 105 mL/min/{1.73_m2} (ref >59)
GFR calc non Af Amer: 91 mL/min/{1.73_m2} (ref >59)
GLOBULIN, TOTAL: 1.8 g/dL (ref 1.5–4.5)
Glucose: 145 mg/dL — ABNORMAL HIGH (ref 65–99)
POTASSIUM: 4.3 mmol/L (ref 3.5–5.2)
SODIUM: 139 mmol/L (ref 134–144)
Total Protein: 5.9 g/dL — ABNORMAL LOW (ref 6.0–8.5)

## 2016-04-07 LAB — HEPATITIS C ANTIBODY: Hep C Virus Ab: <0.1 s/co ratio (ref 0.0–0.9)

## 2016-04-07 LAB — HIV ANTIBODY (ROUTINE TESTING W REFLEX): HIV Screen 4th Generation wRfx: NONREACTIVE

## 2016-05-05 ENCOUNTER — Other Ambulatory Visit: Payer: Self-pay | Admitting: Cardiothoracic Surgery

## 2016-05-05 DIAGNOSIS — D1430 Benign neoplasm of unspecified bronchus and lung: Secondary | ICD-10-CM

## 2016-05-06 ENCOUNTER — Ambulatory Visit (INDEPENDENT_AMBULATORY_CARE_PROVIDER_SITE_OTHER): Payer: 59 | Admitting: Cardiothoracic Surgery

## 2016-05-06 ENCOUNTER — Encounter: Payer: Self-pay | Admitting: Cardiothoracic Surgery

## 2016-05-06 ENCOUNTER — Ambulatory Visit
Admission: RE | Admit: 2016-05-06 | Discharge: 2016-05-06 | Disposition: A | Discharge status: Home / Self Care | Payer: 59 | Source: Ambulatory Visit | Attending: Cardiothoracic Surgery | Admitting: Cardiothoracic Surgery

## 2016-05-06 VITALS — BP 123/75 | HR 70 | Resp 16 | Ht 71.5 in | Wt 217.0 lb

## 2016-05-06 DIAGNOSIS — C3412 Malignant neoplasm of upper lobe, left bronchus or lung: Secondary | ICD-10-CM

## 2016-05-06 DIAGNOSIS — Z902 Acquired absence of lung [part of]: Secondary | ICD-10-CM | POA: Diagnosis not present

## 2016-05-06 DIAGNOSIS — D3A00 Benign carcinoid tumor of unspecified site: Secondary | ICD-10-CM

## 2016-05-06 DIAGNOSIS — D1430 Benign neoplasm of unspecified bronchus and lung: Secondary | ICD-10-CM

## 2016-05-06 NOTE — Progress Notes (Signed)
[address]       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 SURGICAL PROCEDURE: bronchoscopy left video-assisted thoracoscopy, left mini thoracotomy, left upper lobectomy with open suture closure of the left upper lobe bronchial stump, placement of On-Q and lymph node dissection. SURGEON: [name], MD.    History of Present Illness:    This is a 55 year old male e seen in the office today in routine follow-up after resection of carcinoid tumor at the takeoff of his left upper lobe bronchus. Patient continues to work full-time in a [employer]. He does have occasional wheezing, notes that he ingested pace himself with heavy physical activity but otherwise is without symptoms.   Zubrod Score: At the time of surgery this patient's most appropriate activity status/level should be described as: '[x]'$     0    Normal activity, no symptoms '[]'$     1    Restricted in physical strenuous activity but ambulatory, able to do out light work '[]'$     2    Ambulatory and capable of self care, unable to do work activities, up and about                 >50 % of waking hours                                                                                   '[]'$     3    Only limited self care, in bed greater than 50% of waking hours '[]'$     4    Completely disabled, no self care, confined to bed or chair '[]'$     5    Moribund  History  Smoking Status  . Former Smoker  . Types: Cigarettes  . Quit date: 09/27/2013  Smokeless Tobacco  . Never Used   Comment: quit smoking in 2015(cig) and cigars(01/29/15)       No Known Allergies  Current Outpatient Prescriptions  Medication Sig Dispense Refill  . albuterol (PROVENTIL HFA;VENTOLIN HFA) 108 (90 Base) MCG/ACT inhaler Inhale 1-2 puffs into the lungs every 6 (six) hours as needed for wheezing or shortness of breath. 1 Inhaler 2  . atorvastatin (LIPITOR) 20 MG tablet Take 1 tablet (20 mg total) by mouth daily. 90 tablet 1  . budesonide-formoterol (SYMBICORT) 160-4.5 MCG/ACT inhaler Inhale 2 puffs into the lungs 2 (two) times daily. 3 Inhaler 12  . Cholecalciferol (VITAMIN D) 2000 UNITS CAPS Take 1 capsule by mouth daily.    . Magnesium 200 MG TABS Take 1 tablet by mouth daily.     No current facility-administered medications for this visit.  Physical Exam: BP 123/75   Pulse 70   Resp 16   Ht 5' 11.5" (1.816 m)   Wt 217 lb (98.4 kg)   SpO2 98% Comment: ON RA  BMI 29.84 kg/m   General appearance: alert, cooperative and no distress Heart: regular rate and rhythm Lungs: clear to auscultation bilaterally Wound: incis healing well Patient has a palpable 4 cm soft easily movable rubbery mass anterior right chest wall, suggestive of a lipoma he notes these previously had trauma to this area more than 5 years ago.  Diagnostic Studies & Laboratory data:         Recent Radiology Findings: Dg Chest 2 View  Result Date: 05/06/2016 CLINICAL DATA:  Carcinoid tumor of the left lung. EXAM: CHEST  2 VIEW COMPARISON:  Radiographs of July 24, 2015. FINDINGS: The heart size and mediastinal contours are within normal limits. Both lungs are clear. No pneumothorax or pleural effusion is noted. Stable compression deformity of mid thoracic vertebral body is noted. IMPRESSION: No active cardiopulmonary disease. Electronically Signed   By: Marijo Conception, M.D.   On: 05/06/2016 08:53   Ct Chest Wo Contrast  11/06/2015  CLINICAL DATA:  Left upper lobe lung cancer status post left upper  lobectomy in 2016 EXAM: CT CHEST WITHOUT CONTRAST TECHNIQUE: Multidetector CT imaging of the chest was performed following the standard protocol without IV contrast. COMPARISON:  12/20/2014 FINDINGS: Mediastinum/Nodes: The heart is normal in size. No pericardial effusion. Mild atherosclerotic calcifications of the aortic arch. Small mediastinal lymph nodes including a 6 mm short axis prevascular node and a 7 mm short axis low right paratracheal node, within normal limits. Visualized thyroid is unremarkable. Lungs/Pleura: Status post left upper lobectomy. Lungs are essentially clear. No suspicious pulmonary nodules. Mild emphysematous changes. No focal consolidation. No pleural effusion or pneumothorax. Upper abdomen: Visualized upper abdomen is unremarkable. Musculoskeletal: 4.4 cm sebaceous cyst in the right anterior chest wall (series 3/image 26). Degenerative changes of the visualized thoracolumbar spine. IMPRESSION: Status post left upper lobectomy. No findings suspicious for recurrent metastatic disease. Small mediastinal lymph nodes measuring up to 6-7 mm, within normal limits. Electronically Signed   By: Julian Hy M.D.   On: 11/06/2015 11:48      I have independently reviewed the above radiology findings and reviewed findings  with the patient.  Recent Labs: Lab Results  Component Value Date   WBC 10.2 02/20/2015   HGB 11.7 (L) 02/20/2015   HCT 35.1 (L) 02/20/2015   PLT 172 02/20/2015   GLUCOSE 145 (H) 04/06/2016   CHOL 184 04/06/2016   TRIG 118 04/06/2016   HDL 49 04/06/2016   LDLCALC 111 (H) 04/06/2016   ALT 37 04/06/2016   AST 20 04/06/2016   NA 139 04/06/2016   K 4.3 04/06/2016   CL 99 04/06/2016   CREATININE 0.95 04/06/2016   BUN 19 04/06/2016   CO2 26 04/06/2016   INR 0.94 02/13/2015      Assessment / Plan:      Patient is doingwell following his resection of carcinoid tumor With left upper lobectomy. We will see him again in approximately 12 months with a  CT of  the chest. We also discussed the lipoma on the right anterior chest, and discussed possible surgical removal if it became bothersome symptomatic for him.     [name] 05/06/2016 9:33 AM

## 2016-10-11 ENCOUNTER — Encounter: Payer: Self-pay | Admitting: Unknown Physician Specialty

## 2016-10-11 ENCOUNTER — Ambulatory Visit (INDEPENDENT_AMBULATORY_CARE_PROVIDER_SITE_OTHER): Payer: 59 | Admitting: Unknown Physician Specialty

## 2016-10-11 VITALS — BP 129/78 | HR 73 | Temp 98.3°F | Ht 69.8 in | Wt 214.4 lb

## 2016-10-11 DIAGNOSIS — Z0000 Encounter for general adult medical examination without abnormal findings: Secondary | ICD-10-CM

## 2016-10-11 DIAGNOSIS — D3A090 Benign carcinoid tumor of the bronchus and lung: Secondary | ICD-10-CM | POA: Diagnosis not present

## 2016-10-11 DIAGNOSIS — J449 Chronic obstructive pulmonary disease, unspecified: Secondary | ICD-10-CM | POA: Diagnosis not present

## 2016-10-11 DIAGNOSIS — E7800 Pure hypercholesterolemia, unspecified: Secondary | ICD-10-CM

## 2016-10-11 NOTE — Progress Notes (Signed)
BP 129/78 (BP Location: Left Arm, Cuff Size: Large)   Pulse 73   Temp 98.3 F (36.8 C)   Ht 5' 9.8" (1.773 m)   Wt 214 lb 6.4 oz (97.3 kg)   SpO2 96%   BMI 30.94 kg/m    Subjective:    Patient ID: Timothy Benitez, male    DOB: October 19, 1960, 56 y.o.   MRN: [id number]  HPI: Timothy Benitez is a 56 y.o. male  Chief Complaint  Patient presents with  . Annual Exam   COPD Feels symptoms are well controlled Using medications without problems Night time symptoms: none ER visits since last visit:none Missed work or school::none Increased cough: yes Increased SOB:no Quit smoking since 2015  Hyperlipidemia Using medications without problems: No Muscle aches none Diet compliance:good Exercise: Not in the winter time.     Social History   Social History  . Marital status: Divorced    Spouse name: N/A  . Number of children: 5  . Years of education: N/A   Occupational History  . Not on file.   Social History Main Topics  . Smoking status: Former Smoker    Types: Cigarettes    Quit date: 09/27/2013  . Smokeless tobacco: Never Used     Comment: quit smoking in 2015(cig) and cigars(01/29/15)  . Alcohol use 0.6 oz/week    1 Cans of beer per week     Comment: occasionally  . Drug use: No  . Sexual activity: Not Currently   Other Topics Concern  . Not on file   Social History Narrative  . No narrative on file   Family History  Problem Relation Age of Onset  . Leukemia Father   . Alzheimer's disease Mother   . Alcohol abuse Maternal Grandmother   . Heart attack Maternal Grandfather   . Alcohol abuse Paternal Grandfather    Past Surgical History:  Procedure Laterality Date  . BRONCHOSCOPY    . ENDOBRONCHIAL ULTRASOUND N/A 01/02/2015   Procedure: ENDOBRONCHIAL ULTRASOUND;  Surgeon: Flora Lipps, MD;  Location: [facility];  Service: Cardiopulmonary;  Laterality: N/A;  . MULTIPLE EXTRACTIONS WITH ALVEOLOPLASTY N/A 01/29/2015   Procedure: Extraction of tooth #'s  2,3,4,5,6,7,8,9,10,11,12,13,14,15,18,20,21,22,23, 25,26,27,28,29,30 with alveoloplasty and mandibular right lateral exostoses reductions;  Surgeon: Lenn Cal, DDS;  Location: [facility];  Service: Oral Surgery;  Laterality: N/A;  . THORACOTOMY/LOBECTOMY Left 02/17/2015   WITH LEFT UPPER LOBECTOMY WITH LYMPH NODE DISSECTION, OPEN CLOSURE OF LEFT UPPER LOBE BRONCHUS, INTERCOSTAL MUSCLE FLAP COVERAGE OF LEFT UPPER LOBE BRONCHIAL STUMP, PLACEMENT OF ON-Q (Left)  . TONSILLECTOMY  1969  . VASECTOMY  2001  . VIDEO ASSISTED THORACOSCOPY (VATS)/ LOBECTOMY Left 02/17/2015   Procedure: THORACOTOMY WITH LEFT UPPER LOBECTOMY WITH LYMPH NODE DISSECTION, OPEN CLOSURE OF LEFT UPPER LOBE BRONCHUS, INTERCOSTAL MUSCLE FLAP COVERAGE OF LEFT UPPER LOBE BRONCHIAL STUMP, PLACEMENT OF ON-Q;  Surgeon: [name], MD;  Location: [facility];  Service: Thoracic;  Laterality: Left;  Marland Kitchen VIDEO BRONCHOSCOPY  02/17/2015  . VIDEO BRONCHOSCOPY N/A 02/17/2015   Procedure: VIDEO BRONCHOSCOPY;  Surgeon: [name], MD;  Location: [facility];  Service: Thoracic;  Laterality: N/A;  . WISDOM TOOTH EXTRACTION     Past Medical History:  Diagnosis Date  . COPD (chronic obstructive pulmonary disease) (HCC)    Symbicort daily and Albuterol prn  . History of bronchitis 6 months ago  . Hyperlipidemia    takes Atorvastatin daily  . Joint pain   . Lung cancer ([place])   .  Sleep apnea    "mild" never needed CPAP   Relevant past medical, surgical, family and social history reviewed and updated as indicated. Interim medical history since our last visit reviewed. Allergies and medications reviewed and updated.  Review of Systems  Constitutional: Negative.   HENT: Negative.   Eyes: Negative.   Respiratory: Negative.   Cardiovascular: Negative.   Gastrointestinal: Negative.   Endocrine: Negative.   Genitourinary: Negative.   Skin: Negative.   Allergic/Immunologic: Negative.   Neurological: Negative.   Hematological: Negative.     Psychiatric/Behavioral: Negative.    Per HPI unless specifically indicated above    Objective:    BP 129/78 (BP Location: Left Arm, Cuff Size: Large)   Pulse 73   Temp 98.3 F (36.8 C)   Ht 5' 9.8" (1.773 m)   Wt 214 lb 6.4 oz (97.3 kg)   SpO2 96%   BMI 30.94 kg/m   Wt Readings from Last 3 Encounters:  10/11/16 214 lb 6.4 oz (97.3 kg)  05/06/16 217 lb (98.4 kg)  04/06/16 217 lb (98.4 kg)    Physical Exam  Constitutional: He is oriented to person, place, and time. He appears well-developed and well-nourished.  HENT:  Head: Normocephalic.  Right Ear: Tympanic membrane, external ear and ear canal normal.  Left Ear: Tympanic membrane, external ear and ear canal normal.  Mouth/Throat: Uvula is midline, oropharynx is clear and moist and mucous membranes are normal.  Eyes: Pupils are equal, round, and reactive to light.  Cardiovascular: Normal rate, regular rhythm and normal heart sounds.  Exam reveals no gallop and no friction rub.   No murmur heard. Pulmonary/Chest: Effort normal and breath sounds normal. No respiratory distress.  Abdominal: Soft. Bowel sounds are normal. He exhibits no distension. There is no tenderness.  Genitourinary: Rectum normal and prostate normal.  Musculoskeletal: Normal range of motion.  Neurological: He is alert and oriented to person, place, and time. He has normal reflexes.  Skin: Skin is warm and dry.  Psychiatric: He has a normal mood and affect. His behavior is normal. Judgment and thought content normal.      Assessment & Plan:   Problem List Items Addressed This Visit      Unprioritized   Carcinoid tumor of lung    Follow up with thoracic surgery      COPD (chronic obstructive pulmonary disease) (HCC)    Stable, continue present medications.        Hyperlipidemia    Stable, continue present medications.         Other Visit Diagnoses    Routine general medical examination at a health care facility    -  Primary   Relevant  Orders   CBC with Differential/Platelet   Comprehensive metabolic panel   Lipid Panel w/o Chol/HDL Ratio   TSH   PSA       Follow up plan: Return in about 6 months (around 04/10/2017).

## 2016-10-11 NOTE — Assessment & Plan Note (Signed)
Follow up with thoracic surgery

## 2016-10-11 NOTE — Assessment & Plan Note (Signed)
Stable, continue present medications.   

## 2016-10-12 ENCOUNTER — Other Ambulatory Visit: Payer: Self-pay | Admitting: Unknown Physician Specialty

## 2016-10-12 LAB — COMPREHENSIVE METABOLIC PANEL
ALBUMIN: 4.2 g/dL (ref 3.5–5.5)
ALT: 31 IU/L (ref 0–44)
AST: 18 IU/L (ref 0–40)
Albumin/Globulin Ratio: 2 (ref 1.2–2.2)
Alkaline Phosphatase: 67 IU/L (ref 39–117)
BUN / CREAT RATIO: 18 (ref 9–20)
BUN: 17 mg/dL (ref 6–24)
Bilirubin Total: 0.5 mg/dL (ref 0.0–1.2)
CO2: 26 mmol/L (ref 18–29)
CREATININE: 0.94 mg/dL (ref 0.76–1.27)
Calcium: 9.1 mg/dL (ref 8.7–10.2)
Chloride: 98 mmol/L (ref 96–106)
GFR calc Af Amer: 107 mL/min/{1.73_m2} (ref >59)
GFR calc non Af Amer: 92 mL/min/{1.73_m2} (ref >59)
GLOBULIN, TOTAL: 2.1 g/dL (ref 1.5–4.5)
GLUCOSE: 142 mg/dL — AB (ref 65–99)
Potassium: 4.3 mmol/L (ref 3.5–5.2)
SODIUM: 139 mmol/L (ref 134–144)
Total Protein: 6.3 g/dL (ref 6.0–8.5)

## 2016-10-12 LAB — CBC WITH DIFFERENTIAL/PLATELET
Basophils Absolute: 0 10*3/uL (ref 0.0–0.2)
Basos: 0 %
EOS (ABSOLUTE): 0.1 10*3/uL (ref 0.0–0.4)
Eos: 1 %
Hematocrit: 43.7 % (ref 37.5–51.0)
Hemoglobin: 15.1 g/dL (ref 13.0–17.7)
Immature Grans (Abs): 0 10*3/uL (ref 0.0–0.1)
Immature Granulocytes: 0 %
Lymphocytes Absolute: 1.9 10*3/uL (ref 0.7–3.1)
Lymphs: 26 %
MCH: 28.8 pg (ref 26.6–33.0)
MCHC: 34.6 g/dL (ref 31.5–35.7)
MCV: 83 fL (ref 79–97)
Monocytes Absolute: 0.5 10*3/uL (ref 0.1–0.9)
Monocytes: 7 %
Neutrophils Absolute: 4.8 10*3/uL (ref 1.4–7.0)
Neutrophils: 66 %
Platelets: 189 10*3/uL (ref 150–379)
RBC: 5.25 x10E6/uL (ref 4.14–5.80)
RDW: 13.6 % (ref 12.3–15.4)
WBC: 7.3 10*3/uL (ref 3.4–10.8)

## 2016-10-12 LAB — LIPID PANEL W/O CHOL/HDL RATIO
Cholesterol, Total: 193 mg/dL (ref 100–199)
HDL: 46 mg/dL (ref >39)
LDL Calculated: 108 mg/dL — ABNORMAL HIGH (ref 0–99)
Triglycerides: 197 mg/dL — ABNORMAL HIGH (ref 0–149)
VLDL Cholesterol Cal: 39 mg/dL (ref 5–40)

## 2016-10-12 LAB — PSA: Prostate Specific Ag, Serum: 1 ng/mL (ref 0.0–4.0)

## 2016-10-12 LAB — TSH: TSH: 1.53 u[IU]/mL (ref 0.450–4.500)

## 2016-10-13 ENCOUNTER — Encounter (HOSPITAL_COMMUNITY): Payer: Self-pay | Admitting: Dentistry

## 2016-10-13 ENCOUNTER — Ambulatory Visit (HOSPITAL_COMMUNITY): Payer: Self-pay | Admitting: Dentistry

## 2016-10-13 VITALS — BP 141/64 | HR 72 | Temp 98.0°F

## 2016-10-13 DIAGNOSIS — Z972 Presence of dental prosthetic device (complete) (partial): Secondary | ICD-10-CM

## 2016-10-13 DIAGNOSIS — D3A00 Benign carcinoid tumor of unspecified site: Secondary | ICD-10-CM | POA: Diagnosis not present

## 2016-10-13 DIAGNOSIS — D3A090 Benign carcinoid tumor of the bronchus and lung: Secondary | ICD-10-CM

## 2016-10-13 DIAGNOSIS — K08109 Complete loss of teeth, unspecified cause, unspecified class: Secondary | ICD-10-CM

## 2016-10-13 DIAGNOSIS — K0820 Unspecified atrophy of edentulous alveolar ridge: Secondary | ICD-10-CM

## 2016-10-13 DIAGNOSIS — Z463 Encounter for fitting and adjustment of dental prosthetic device: Secondary | ICD-10-CM

## 2016-10-13 DIAGNOSIS — R432 Parageusia: Secondary | ICD-10-CM

## 2016-10-13 NOTE — Patient Instructions (Signed)
Plan:  1. Return to dental medicine as scheduled. Call if problems arise before then.  2. Keep dentures out if sore spots arise. Use salt water rinses as needed to aid healing.  Ronald F. Kulinski, DDS  

## 2016-10-13 NOTE — Progress Notes (Signed)
10/13/2016  Patient Name:   Timothy Benitez Date of Birth:   [birth year] Medical Record Number: [id number]  BP (!) 141/64 (BP Location: Left Arm)   Pulse 72   Temp 98 F (36.7 C) (Oral)   Timothy Benitez is a 56 year old male that presents for periodic oral examination and evaluation of upper and lower complete dentures. The has a history of carcinoid tumor of the lung. Patient was seen for evaluation of poor dentition in June 2016. Patient underwent extraction remaining teeth with alveoloplasty on 01/29/2015. Upper and lower complete dentures were fabricated and inserted on 04/24/2015. Patient was seen for multiple denture adjustment appointments. Patient now presents for his annual examination and evaluation of the upper and lower complete dentures.    Medical Hx Update:  Past Medical History:  Diagnosis Date  . COPD (chronic obstructive pulmonary disease) (HCC)    Symbicort daily and Albuterol prn  . History of bronchitis 6 months ago  . Hyperlipidemia    takes Atorvastatin daily  . Joint pain   . Lung cancer ([place])   . Sleep apnea    "mild" never needed CPAP  .  Past Surgical History:  Procedure Laterality Date  . BRONCHOSCOPY    . ENDOBRONCHIAL ULTRASOUND N/A 01/02/2015   Procedure: ENDOBRONCHIAL ULTRASOUND;  Surgeon: Flora Lipps, MD;  Location: [facility];  Service: Cardiopulmonary;  Laterality: N/A;  . MULTIPLE EXTRACTIONS WITH ALVEOLOPLASTY N/A 01/29/2015   Procedure: Extraction of tooth #'s 2,3,4,5,6,7,8,9,10,11,12,13,14,15,18,20,21,22,23, 25,26,27,28,29,30 with alveoloplasty and mandibular right lateral exostoses reductions;  Surgeon: Lenn Cal, DDS;  Location: [facility];  Service: Oral Surgery;  Laterality: N/A;  . THORACOTOMY/LOBECTOMY Left 02/17/2015   WITH LEFT UPPER LOBECTOMY WITH LYMPH NODE DISSECTION, OPEN CLOSURE OF LEFT UPPER LOBE BRONCHUS, INTERCOSTAL MUSCLE FLAP COVERAGE OF LEFT UPPER LOBE BRONCHIAL STUMP, PLACEMENT OF ON-Q (Left)  . TONSILLECTOMY  1969  .  VASECTOMY  2001  . VIDEO ASSISTED THORACOSCOPY (VATS)/ LOBECTOMY Left 02/17/2015   Procedure: THORACOTOMY WITH LEFT UPPER LOBECTOMY WITH LYMPH NODE DISSECTION, OPEN CLOSURE OF LEFT UPPER LOBE BRONCHUS, INTERCOSTAL MUSCLE FLAP COVERAGE OF LEFT UPPER LOBE BRONCHIAL STUMP, PLACEMENT OF ON-Q;  Surgeon: [name], MD;  Location: [facility];  Service: Thoracic;  Laterality: Left;  Marland Kitchen VIDEO BRONCHOSCOPY  02/17/2015  . VIDEO BRONCHOSCOPY N/A 02/17/2015   Procedure: VIDEO BRONCHOSCOPY;  Surgeon: [name], MD;  Location: [facility];  Service: Thoracic;  Laterality: N/A;  . WISDOM TOOTH EXTRACTION      ALLERGIES/ADVERSE DRUG REACTIONS: No Known Allergies  MEDICATIONS: Current Outpatient Prescriptions  Medication Sig Dispense Refill  . albuterol (PROVENTIL HFA;VENTOLIN HFA) 108 (90 Base) MCG/ACT inhaler Inhale 1-2 puffs into the lungs every 6 (six) hours as needed for wheezing or shortness of breath. 1 Inhaler 2  . atorvastatin (LIPITOR) 20 MG tablet Take 1 tablet (20 mg total) by mouth daily. 90 tablet 0  . budesonide-formoterol (SYMBICORT) 160-4.5 MCG/ACT inhaler Inhale 2 puffs into the lungs 2 (two) times daily. 3 Inhaler 12  . Cholecalciferol (VITAMIN D) 2000 UNITS CAPS Take 1 capsule by mouth daily.    . Magnesium 200 MG TABS Take 1 tablet by mouth daily.    . Potassium 95 MG TABS Take 95 mg by mouth daily.     No current facility-administered medications for this visit.     C/C: Patient denies any problems with his dentures.   HPI:   Patient was seen for evaluation of poor dentition in June of 2016. Patient underwent extraction of remaining teeth  with alveoloplasty on 01/29/2015. Upper and lower complete dentures were fabricated and inserted on 04/24/2015. Patient was seen for multiple denture adjustment appointments. Patient now presents for his annual examination and evaluation of the upper and lower complete dentures.   DENTAL EXAM: General: Patient is a well-developed, well-nourished  male in no acute distress. Vitals: BP (!) 141/64 (BP Location: Left Arm)   Pulse 72   Temp 98 F (36.7 C) (Oral)  Extraoral Exam: There is no palpable neck lymphadenopathy. The patient denies acute TMJ symptoms. Intraoral  Exam: Patient has normal saliva. There is no evidence of denture irritation. Dentition: Patient is edentulous. Prosthodontic: The patient has upper and lower complete dentures that are stable and retentive. Pressure indicating paste was applied to dentures and minimal adjustments were made. Dentures were polished. Occlusion:  Patient has acceptable occlusion and centric relation and protrusive strokes. No adjustments were needed today.   Assessments: 1. History of carcinoid tumor of the lung 2. Patient is edentulous 3. Patient has upper and lower complete dentures that are stable and retentive   Plan:  1. Return to dental medicine as scheduled. Call if problems arise before then.  2. Keep dentures out if sore spots arise. Use salt water rinses as needed to aid healing.  Lenn Cal, DDS

## 2016-11-11 ENCOUNTER — Other Ambulatory Visit: Payer: Self-pay | Admitting: Unknown Physician Specialty

## 2016-12-07 ENCOUNTER — Other Ambulatory Visit: Payer: Self-pay | Admitting: Unknown Physician Specialty

## 2016-12-07 ENCOUNTER — Encounter: Payer: Self-pay | Admitting: Unknown Physician Specialty

## 2016-12-07 ENCOUNTER — Ambulatory Visit (INDEPENDENT_AMBULATORY_CARE_PROVIDER_SITE_OTHER): Payer: 59 | Admitting: Unknown Physician Specialty

## 2016-12-07 ENCOUNTER — Telehealth: Payer: Self-pay | Admitting: Unknown Physician Specialty

## 2016-12-07 VITALS — BP 133/66 | HR 64 | Temp 97.9°F | Wt 222.8 lb

## 2016-12-07 DIAGNOSIS — M7581 Other shoulder lesions, right shoulder: Secondary | ICD-10-CM

## 2016-12-07 DIAGNOSIS — M778 Other enthesopathies, not elsewhere classified: Secondary | ICD-10-CM

## 2016-12-07 MED ORDER — ATORVASTATIN CALCIUM 20 MG PO TABS: 20.0000 mg | ORAL_TABLET | Freq: Every day | ORAL | 1 refills | Status: DC

## 2016-12-07 MED ORDER — MELOXICAM 15 MG PO TABS: 15.0000 mg | ORAL_TABLET | Freq: Every day | ORAL | 0 refills | Status: DC

## 2016-12-07 NOTE — Telephone Encounter (Signed)
Called and spoke to patient. He stated he would like to go to PT because he wants his shoulder to get better and not worse.

## 2016-12-07 NOTE — Progress Notes (Signed)
BP 133/66 (BP Location: Left Arm, Cuff Size: Large)   Pulse 64   Temp 97.9 F (36.6 C)   Wt 222 lb 12.8 oz (101.1 kg)   SpO2 96%   BMI 32.15 kg/m    Subjective:    Patient ID: Timothy Benitez, male    DOB: 12-01-1960, 56 y.o.   MRN: [id number]  HPI: Timothy Benitez is a 56 y.o. male  Chief Complaint  Patient presents with  . Shoulder Pain    Patient states his right shoulder has been hurting since the middle of last week. States pain is getting worse and has taken otc pain relievers with little relief.    Shoulder Pain   This is a new problem. The current episode started in the past 7 days. There has been no history of extremity trauma. The problem occurs constantly. The problem has been gradually worsening. The quality of the pain is described as aching. Pertinent negatives include no fever, joint locking or limited range of motion. The symptoms are aggravated by lying down (extending arm). He has tried NSAIDS for the symptoms. The treatment provided moderate relief. Family history does not include gout or rheumatoid arthritis. There is no history of diabetes or osteoarthritis.  Works in a [employer] and lifting.    Family History  Problem Relation Age of Onset  . Leukemia Father   . Alzheimer's disease Mother   . Alcohol abuse Maternal Grandmother   . Heart attack Maternal Grandfather   . Alcohol abuse Paternal Grandfather    Past Medical History:  Diagnosis Date  . COPD (chronic obstructive pulmonary disease) (HCC)    Symbicort daily and Albuterol prn  . History of bronchitis 6 months ago  . Hyperlipidemia    takes Atorvastatin daily  . Joint pain   . Lung cancer ([place])   . Sleep apnea    "mild" never needed CPAP   Relevant past medical, surgical, family and social history reviewed and updated as indicated. Interim medical history since our last visit reviewed. Allergies and medications reviewed and updated.  Review of Systems  Constitutional: Negative for fever.      Per HPI unless specifically indicated above     Objective:    BP 133/66 (BP Location: Left Arm, Cuff Size: Large)   Pulse 64   Temp 97.9 F (36.6 C)   Wt 222 lb 12.8 oz (101.1 kg)   SpO2 96%   BMI 32.15 kg/m   Wt Readings from Last 3 Encounters:  12/07/16 222 lb 12.8 oz (101.1 kg)  10/11/16 214 lb 6.4 oz (97.3 kg)  05/06/16 217 lb (98.4 kg)    Physical Exam  Constitutional: He is oriented to person, place, and time. He appears well-developed and well-nourished. No distress.  HENT:  Head: Normocephalic and atraumatic.  Eyes: Conjunctivae and lids are normal. Right eye exhibits no discharge. Left eye exhibits no discharge. No scleral icterus.  Neck: Normal range of motion. Neck supple. No JVD present. Carotid bruit is not present.  Cardiovascular: Normal rate, regular rhythm and normal heart sounds.   Pulmonary/Chest: Effort normal and breath sounds normal. No respiratory distress.  Abdominal: Normal appearance. There is no splenomegaly or hepatomegaly.  Musculoskeletal:       Right shoulder: He exhibits decreased range of motion, tenderness and pain. He exhibits no bony tenderness, no swelling, no effusion, no crepitus, no deformity, no laceration, no spasm, normal pulse and normal strength.  Neurological: He is alert and oriented to person, place,  and time.  Skin: Skin is warm, dry and intact. No rash noted. No pallor.  Psychiatric: He has a normal mood and affect. His behavior is normal. Judgment and thought content normal.      Assessment & Plan:   Problem List Items Addressed This Visit    None    Visit Diagnoses    Tendinitis of right shoulder    -  Primary   New problem.  Pt will talk to supervisor and ask about workman's comp referral as requires rest. consider PT to continue his job.  Rx for Moeloxicam.     Relevant Medications   meloxicam (MOBIC) 15 MG tablet       Follow up plan: Return if symptoms worsen or fail to improve.

## 2016-12-07 NOTE — Telephone Encounter (Signed)
OK, did he want me to set that up?

## 2016-12-07 NOTE — Telephone Encounter (Signed)
Please give him the number for [facility].

## 2016-12-07 NOTE — Telephone Encounter (Signed)
Routing to provider  

## 2016-12-07 NOTE — Telephone Encounter (Signed)
Patient states he was supposed to let cheryl know if he wanted to get a referral for PT.  He would like to be referred to PT somewhere in [place] area.  Thanks  [phone]

## 2016-12-07 NOTE — Patient Instructions (Addendum)
Shoulder Pain Many things can cause shoulder pain, including:  An injury to the area.  Overuse of the shoulder.  Arthritis. The source of the pain can be:  Inflammation.  An injury to the shoulder joint.  An injury to a tendon, ligament, or bone. Follow these instructions at home: Take these actions to help with your pain:  Squeeze a soft ball or a foam pad as much as possible. This helps to keep the shoulder from swelling. It also helps to strengthen the arm.  Take over-the-counter and prescription medicines only as told by your health care provider.  If directed, apply ice to the area:  Put ice in a plastic bag.  Place a towel between your skin and the bag.  Leave the ice on for 20 minutes, 2-3 times per day. Stop applying ice if it does not help with the pain.  If you were given a shoulder sling or immobilizer:  Wear it as told.  Remove it to shower or bathe.  Move your arm as little as possible, but keep your hand moving to prevent swelling. Contact a health care provider if:  Your pain gets worse.  Your pain is not relieved with medicines.  New pain develops in your arm, hand, or fingers. Get help right away if:  Your arm, hand, or fingers:  Tingle.  Become numb.  Become swollen.  Become painful.  Turn white or blue. This information is not intended to replace advice given to you by your health care provider. Make sure you discuss any questions you have with your health care provider. Document Released: 05/19/2005 Document Revised: 04/04/2016 Document Reviewed: 12/02/2014 Elsevier Interactive Patient Education  2017 [place].  Shoulder Exercises Ask your health care provider which exercises are safe for you. Do exercises exactly as told by your health care provider and adjust them as directed. It is normal to feel mild stretching, pulling, tightness, or discomfort as you do these exercises, but you should stop right away if you feel sudden pain  or your pain gets worse.Do not begin these exercises until told by your health care provider. RANGE OF MOTION EXERCISES  These exercises warm up your muscles and joints and improve the movement and flexibility of your shoulder. These exercises also help to relieve pain, numbness, and tingling. These exercises involve stretching your injured shoulder directly. Exercise A: Pendulum   1. Stand near a wall or a surface that you can hold onto for balance. 2. Bend at the waist and let your left / right arm hang straight down. Use your other arm to support you. Keep your back straight and do not lock your knees. 3. Relax your left / right arm and shoulder muscles, and move your hips and your trunk so your left / right arm swings freely. Your arm should swing because of the motion of your body, not because you are using your arm or shoulder muscles. 4. Keep moving your body so your arm swings in the following directions, as told by your health care provider:  Side to side.  Forward and backward.  In clockwise and counterclockwise circles. 5. Continue each motion for __________ seconds, or for as long as told by your health care provider. 6. Slowly return to the starting position. Repeat __________ times. Complete this exercise __________ times a day. Exercise B:Flexion, Standing   1. Stand and hold a broomstick, a cane, or a similar object. Place your hands a little more than shoulder-width apart on the object. Your  left / right hand should be palm-up, and your other hand should be palm-down. 2. Keep your elbow straight and keep your shoulder muscles relaxed. Push the stick down with your healthy arm to raise your left / right arm in front of your body, and then over your head until you feel a stretch in your shoulder.  Avoid shrugging your shoulder while you raise your arm. Keep your shoulder blade tucked down toward the middle of your back. 3. Hold for __________ seconds. 4. Slowly return to the  starting position. Repeat __________ times. Complete this exercise __________ times a day. Exercise C: Abduction, Standing  1. Stand and hold a broomstick, a cane, or a similar object. Place your hands a little more than shoulder-width apart on the object. Your left / right hand should be palm-up, and your other hand should be palm-down. 2. While keeping your elbow straight and your shoulder muscles relaxed, push the stick across your body toward your left / right side. Raise your left / right arm to the side of your body and then over your head until you feel a stretch in your shoulder.  Do not raise your arm above shoulder height, unless your health care provider tells you to do that.  Avoid shrugging your shoulder while you raise your arm. Keep your shoulder blade tucked down toward the middle of your back. 3. Hold for __________ seconds. 4. Slowly return to the starting position. Repeat __________ times. Complete this exercise __________ times a day. Exercise D:Internal Rotation   1. Place your left / right hand behind your back, palm-up. 2. Use your other hand to dangle an exercise band, a towel, or a similar object over your shoulder. Grasp the band with your left / right hand so you are holding onto both ends. 3. Gently pull up on the band until you feel a stretch in the front of your left / right shoulder.  Avoid shrugging your shoulder while you raise your arm. Keep your shoulder blade tucked down toward the middle of your back. 4. Hold for __________ seconds. 5. Release the stretch by letting go of the band and lowering your hands. Repeat __________ times. Complete this exercise __________ times a day. STRETCHING EXERCISES  These exercises warm up your muscles and joints and improve the movement and flexibility of your shoulder. These exercises also help to relieve pain, numbness, and tingling. These exercises are done using your healthy shoulder to help stretch the muscles of your  injured shoulder. Exercise E: Warehouse manager (External Rotation and Abduction)   1. Stand in a doorway with one of your feet slightly in front of the other. This is called a staggered stance. If you cannot reach your forearms to the door frame, stand facing a corner of a room. 2. Choose one of the following positions as told by your health care provider:  Place your hands and forearms on the door frame above your head.  Place your hands and forearms on the door frame at the height of your head.  Place your hands on the door frame at the height of your elbows. 3. Slowly move your weight onto your front foot until you feel a stretch across your chest and in the front of your shoulders. Keep your head and chest upright and keep your abdominal muscles tight. 4. Hold for __________ seconds. 5. To release the stretch, shift your weight to your back foot. Repeat __________ times. Complete this stretch __________ times a day. Exercise F:Extension,  Standing  1. Stand and hold a broomstick, a cane, or a similar object behind your back.  Your hands should be a little wider than shoulder-width apart.  Your palms should face away from your back. 2. Keeping your elbows straight and keeping your shoulder muscles relaxed, move the stick away from your body until you feel a stretch in your shoulder.  Avoid shrugging your shoulders while you move the stick. Keep your shoulder blade tucked down toward the middle of your back. 3. Hold for __________ seconds. 4. Slowly return to the starting position. Repeat __________ times. Complete this exercise __________ times a day. STRENGTHENING EXERCISES  These exercises build strength and endurance in your shoulder. Endurance is the ability to use your muscles for a long time, even after they get tired. Exercise G:External Rotation   1. Sit in a stable chair without armrests. 2. Secure an exercise band at elbow height on your left / right side. 3. Place a soft  object, such as a folded towel or a small pillow, between your left / right upper arm and your body to move your elbow a few inches away (about 10 cm) from your side. 4. Hold the end of the band so it is tight and there is no slack. 5. Keeping your elbow pressed against the soft object, move your left / right forearm out, away from your abdomen. Keep your body steady so only your forearm moves. 6. Hold for __________ seconds. 7. Slowly return to the starting position. Repeat __________ times. Complete this exercise __________ times a day. Exercise H:Shoulder Abduction   1. Sit in a stable chair without armrests, or stand. 2. Hold a __________ weight in your left / right hand, or hold an exercise band with both hands. 3. Start with your arms straight down and your left / right palm facing in, toward your body. 4. Slowly lift your left / right hand out to your side. Do not lift your hand above shoulder height unless your health care provider tells you that this is safe.  Keep your arms straight.  Avoid shrugging your shoulder while you do this movement. Keep your shoulder blade tucked down toward the middle of your back. 5. Hold for __________ seconds. 6. Slowly lower your arm, and return to the starting position. Repeat __________ times. Complete this exercise __________ times a day. Exercise I:Shoulder Extension  1. Sit in a stable chair without armrests, or stand. 2. Secure an exercise band to a stable object in front of you where it is at shoulder height. 3. Hold one end of the exercise band in each hand. Your palms should face each other. 4. Straighten your elbows and lift your hands up to shoulder height. 5. Step back, away from the secured end of the exercise band, until the band is tight and there is no slack. 6. Squeeze your shoulder blades together as you pull your hands down to the sides of your thighs. Stop when your hands are straight down by your sides. Do not let your hands go  behind your body. 7. Hold for __________ seconds. 8. Slowly return to the starting position. Repeat __________ times. Complete this exercise __________ times a day. Exercise J:Standing Shoulder Row  1. Sit in a stable chair without armrests, or stand. 2. Secure an exercise band to a stable object in front of you so it is at waist height. 3. Hold one end of the exercise band in each hand. Your palms should be in a  thumbs-up position. 4. Bend each of your elbows to an "L" shape (about 90 degrees) and keep your upper arms at your sides. 5. Step back until the band is tight and there is no slack. 6. Slowly pull your elbows back behind you. 7. Hold for __________ seconds. 8. Slowly return to the starting position. Repeat __________ times. Complete this exercise __________ times a day. Exercise K:Shoulder Press-Ups   1. Sit in a stable chair that has armrests. Sit upright, with your feet flat on the floor. 2. Put your hands on the armrests so your elbows are bent and your fingers are pointing forward. Your hands should be about even with the sides of your body. 3. Push down on the armrests and use your arms to lift yourself off of the chair. Straighten your elbows and lift yourself up as much as you comfortably can.  Move your shoulder blades down, and avoid letting your shoulders move up toward your ears.  Keep your feet on the ground. As you get stronger, your feet should support less of your body weight as you lift yourself up. 4. Hold for __________ seconds. 5. Slowly lower yourself back into the chair. Repeat __________ times. Complete this exercise __________ times a day. Exercise L: Wall Push-Ups   1. Stand so you are facing a stable wall. Your feet should be about one arm-length away from the wall. 2. Lean forward and place your palms on the wall at shoulder height. 3. Keep your feet flat on the floor as you bend your elbows and lean forward toward the wall. 4. Hold for __________  seconds. 5. Straighten your elbows to push yourself back to the starting position. Repeat __________ times. Complete this exercise __________ times a day. This information is not intended to replace advice given to you by your health care provider. Make sure you discuss any questions you have with your health care provider. Document Released: 06/23/2005 Document Revised: 05/03/2016 Document Reviewed: 04/20/2015 Elsevier Interactive Patient Education  2017 [employer].

## 2016-12-08 NOTE — Telephone Encounter (Signed)
Looked up [facility] and it is now [facility]. Form filled out, signed by [name], and faxed to them. Will call patient and give him their phone number.

## 2016-12-08 NOTE — Telephone Encounter (Signed)
Tried calling patient to give him [facility]'s phone number [phone]), VM box was full so I was unable to leave a VM. Will try to call again later.

## 2016-12-08 NOTE — Telephone Encounter (Signed)
Called and spoke to patient. He stated that [facility] called him this morning and he has an appointment to go see them at 8:30 Friday morning.

## 2016-12-10 DIAGNOSIS — M6281 Muscle weakness (generalized): Secondary | ICD-10-CM | POA: Diagnosis not present

## 2016-12-10 DIAGNOSIS — M25511 Pain in right shoulder: Secondary | ICD-10-CM | POA: Diagnosis not present

## 2016-12-10 DIAGNOSIS — M25611 Stiffness of right shoulder, not elsewhere classified: Secondary | ICD-10-CM | POA: Diagnosis not present

## 2016-12-13 DIAGNOSIS — M6281 Muscle weakness (generalized): Secondary | ICD-10-CM | POA: Diagnosis not present

## 2016-12-13 DIAGNOSIS — M25511 Pain in right shoulder: Secondary | ICD-10-CM | POA: Diagnosis not present

## 2016-12-13 DIAGNOSIS — M25611 Stiffness of right shoulder, not elsewhere classified: Secondary | ICD-10-CM | POA: Diagnosis not present

## 2016-12-14 DIAGNOSIS — M6281 Muscle weakness (generalized): Secondary | ICD-10-CM | POA: Diagnosis not present

## 2016-12-14 DIAGNOSIS — M25611 Stiffness of right shoulder, not elsewhere classified: Secondary | ICD-10-CM | POA: Diagnosis not present

## 2016-12-14 DIAGNOSIS — M25511 Pain in right shoulder: Secondary | ICD-10-CM | POA: Diagnosis not present

## 2016-12-20 DIAGNOSIS — M25511 Pain in right shoulder: Secondary | ICD-10-CM | POA: Diagnosis not present

## 2016-12-20 DIAGNOSIS — M6281 Muscle weakness (generalized): Secondary | ICD-10-CM | POA: Diagnosis not present

## 2016-12-20 DIAGNOSIS — M25611 Stiffness of right shoulder, not elsewhere classified: Secondary | ICD-10-CM | POA: Diagnosis not present

## 2016-12-22 DIAGNOSIS — M25611 Stiffness of right shoulder, not elsewhere classified: Secondary | ICD-10-CM | POA: Diagnosis not present

## 2016-12-22 DIAGNOSIS — M6281 Muscle weakness (generalized): Secondary | ICD-10-CM | POA: Diagnosis not present

## 2016-12-22 DIAGNOSIS — M25511 Pain in right shoulder: Secondary | ICD-10-CM | POA: Diagnosis not present

## 2016-12-27 DIAGNOSIS — M25611 Stiffness of right shoulder, not elsewhere classified: Secondary | ICD-10-CM | POA: Diagnosis not present

## 2016-12-27 DIAGNOSIS — M25511 Pain in right shoulder: Secondary | ICD-10-CM | POA: Diagnosis not present

## 2016-12-27 DIAGNOSIS — M6281 Muscle weakness (generalized): Secondary | ICD-10-CM | POA: Diagnosis not present

## 2016-12-29 DIAGNOSIS — M6281 Muscle weakness (generalized): Secondary | ICD-10-CM | POA: Diagnosis not present

## 2016-12-29 DIAGNOSIS — M25611 Stiffness of right shoulder, not elsewhere classified: Secondary | ICD-10-CM | POA: Diagnosis not present

## 2016-12-29 DIAGNOSIS — M25511 Pain in right shoulder: Secondary | ICD-10-CM | POA: Diagnosis not present

## 2017-01-03 DIAGNOSIS — M25611 Stiffness of right shoulder, not elsewhere classified: Secondary | ICD-10-CM | POA: Diagnosis not present

## 2017-01-03 DIAGNOSIS — M6281 Muscle weakness (generalized): Secondary | ICD-10-CM | POA: Diagnosis not present

## 2017-01-03 DIAGNOSIS — M25511 Pain in right shoulder: Secondary | ICD-10-CM | POA: Diagnosis not present

## 2017-01-05 DIAGNOSIS — M6281 Muscle weakness (generalized): Secondary | ICD-10-CM | POA: Diagnosis not present

## 2017-01-05 DIAGNOSIS — M25511 Pain in right shoulder: Secondary | ICD-10-CM | POA: Diagnosis not present

## 2017-01-05 DIAGNOSIS — M25611 Stiffness of right shoulder, not elsewhere classified: Secondary | ICD-10-CM | POA: Diagnosis not present

## 2017-01-12 DIAGNOSIS — M6281 Muscle weakness (generalized): Secondary | ICD-10-CM | POA: Diagnosis not present

## 2017-01-12 DIAGNOSIS — M25611 Stiffness of right shoulder, not elsewhere classified: Secondary | ICD-10-CM | POA: Diagnosis not present

## 2017-01-12 DIAGNOSIS — M25511 Pain in right shoulder: Secondary | ICD-10-CM | POA: Diagnosis not present

## 2017-01-18 DIAGNOSIS — M25611 Stiffness of right shoulder, not elsewhere classified: Secondary | ICD-10-CM | POA: Diagnosis not present

## 2017-01-18 DIAGNOSIS — M25511 Pain in right shoulder: Secondary | ICD-10-CM | POA: Diagnosis not present

## 2017-01-18 DIAGNOSIS — M6281 Muscle weakness (generalized): Secondary | ICD-10-CM | POA: Diagnosis not present

## 2017-01-19 ENCOUNTER — Ambulatory Visit (INDEPENDENT_AMBULATORY_CARE_PROVIDER_SITE_OTHER): Payer: 59 | Admitting: Family Medicine

## 2017-01-19 ENCOUNTER — Encounter: Payer: Self-pay | Admitting: Family Medicine

## 2017-01-19 VITALS — BP 135/79 | HR 79 | Temp 98.9°F | Wt 218.0 lb

## 2017-01-19 DIAGNOSIS — S161XXA Strain of muscle, fascia and tendon at neck level, initial encounter: Secondary | ICD-10-CM

## 2017-01-19 MED ORDER — CYCLOBENZAPRINE HCL 10 MG PO TABS: 10.0000 mg | ORAL_TABLET | Freq: Three times a day (TID) | ORAL | 0 refills | Status: DC | PRN

## 2017-01-19 MED ORDER — PREDNISONE 20 MG PO TABS: 40.0000 mg | ORAL_TABLET | Freq: Every day | ORAL | 0 refills | Status: DC

## 2017-01-19 NOTE — Progress Notes (Signed)
BP 135/79   Pulse 79   Temp 98.9 F (37.2 C)   Wt 218 lb (98.9 kg)   SpO2 96%   BMI 31.46 kg/m    Subjective:    Patient ID: Timothy Benitez, male    DOB: Oct 06, 1960, 56 y.o.   MRN: [id number]  HPI: Timothy Benitez is a 56 y.o. male  Chief Complaint  Patient presents with  . Neck Pain    x 1 day. was working and bending and stretching. Felt a pop, felt a little lightheaded, felt hot and pressure, felt like hearing was different (hearing a woosh), stomach has been sour all day.  pressure/headache started at the back of his neck and wrapped all the way to his forehead. Headache is resolved now but if he exerts himself it comes back.   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 Felt a pop at one point when twisting his neck and proceeded to feel nauseous, lightheaded, and had a squeezing headache all the way across his scalp to his forehead. Took some tylenol and put ice on the area with some relief. Denies visual or hearing changes, arm pain/weakness, decreased ROM or strength, or sharp midline pain. No hx of neck injury.    Relevant past medical, surgical, family and social history reviewed and updated as indicated. Interim medical history since our last visit reviewed. Allergies and medications reviewed and updated.  Review of Systems  Constitutional: Negative.   HENT: Negative.   Eyes: Negative.   Respiratory: Negative.   Cardiovascular: Negative.   Gastrointestinal: Positive for nausea.  Genitourinary: Negative.   Musculoskeletal: Positive for neck pain and neck stiffness.  Neurological: Positive for light-headedness and headaches.  Psychiatric/Behavioral: Negative.     Per HPI unless specifically indicated above     Objective:    BP 135/79   Pulse 79   Temp 98.9 F (37.2 C)   Wt 218 lb (98.9 kg)   SpO2 96%   BMI 31.46 kg/m   Wt Readings from Last 3 Encounters:  01/19/17 218 lb (98.9 kg)  12/07/16 222 lb 12.8 oz  (101.1 kg)  10/11/16 214 lb 6.4 oz (97.3 kg)    Physical Exam  Constitutional: He is oriented to person, place, and time. He appears well-developed and well-nourished. No distress.  HENT:  Head: Atraumatic.  Eyes: Conjunctivae and EOM are normal. Pupils are equal, round, and reactive to light.  Neck: Normal range of motion. Neck supple. No thyromegaly present.  Cardiovascular: Normal rate and normal heart sounds.   Pulmonary/Chest: Effort normal and breath sounds normal. No respiratory distress.  Musculoskeletal: Normal range of motion. He exhibits tenderness (Mild TTP over scalp, especially up from base of neck. Mild ttp over b/l trapezius muscles). He exhibits no edema or deformity.  No spinal TTP or gross deformity noted. Neck strength and ROM fully intact  Lymphadenopathy:    He has no cervical adenopathy.  Neurological: He is alert and oriented to person, place, and time. No cranial nerve deficit. Coordination normal.  Skin: Skin is warm and dry.  Psychiatric: He has a normal mood and affect. His behavior is normal.  Nursing note and vitals reviewed.     Assessment & Plan:   Problem List Items Addressed This Visit    None    Visit Diagnoses    Strain of neck muscle, initial encounter    -  Primary   Prednisone and flexeril sent,  tylenol prn for pain relief. Stretches, heat, massage, epsom salt soaks. Will get imaging if no relief in 1-2 weeks.     Exam reassuring for muscular etiology, low suspicion for fx at this time given excellent ROM and no spinal TTP.   Follow up plan: Return if symptoms worsen or fail to improve.

## 2017-01-19 NOTE — Patient Instructions (Signed)
Hold the meloxicam while taking the prednisone. Can take tylenol during the course of the prednisone then resume meloxicam.

## 2017-03-02 ENCOUNTER — Other Ambulatory Visit: Payer: Self-pay | Admitting: Unknown Physician Specialty

## 2017-04-06 ENCOUNTER — Other Ambulatory Visit: Payer: Self-pay | Admitting: Unknown Physician Specialty

## 2017-04-15 ENCOUNTER — Other Ambulatory Visit: Payer: Self-pay | Admitting: Cardiothoracic Surgery

## 2017-04-15 DIAGNOSIS — D3A090 Benign carcinoid tumor of the bronchus and lung: Secondary | ICD-10-CM

## 2017-05-04 ENCOUNTER — Other Ambulatory Visit: Payer: Self-pay | Admitting: Unknown Physician Specialty

## 2017-05-12 ENCOUNTER — Ambulatory Visit
Admission: RE | Admit: 2017-05-12 | Discharge: 2017-05-12 | Disposition: A | Discharge status: Home / Self Care | Payer: 59 | Source: Ambulatory Visit | Attending: Cardiothoracic Surgery | Admitting: Cardiothoracic Surgery

## 2017-05-12 ENCOUNTER — Ambulatory Visit (INDEPENDENT_AMBULATORY_CARE_PROVIDER_SITE_OTHER): Payer: 59 | Admitting: Cardiothoracic Surgery

## 2017-05-12 ENCOUNTER — Encounter: Payer: Self-pay | Admitting: Cardiothoracic Surgery

## 2017-05-12 VITALS — BP 135/77 | HR 66 | Resp 20 | Ht 70.0 in | Wt 218.0 lb

## 2017-05-12 DIAGNOSIS — R079 Chest pain, unspecified: Secondary | ICD-10-CM | POA: Diagnosis not present

## 2017-05-12 DIAGNOSIS — Z902 Acquired absence of lung [part of]: Secondary | ICD-10-CM | POA: Diagnosis not present

## 2017-05-12 DIAGNOSIS — D3A00 Benign carcinoid tumor of unspecified site: Secondary | ICD-10-CM

## 2017-05-12 DIAGNOSIS — D3A090 Benign carcinoid tumor of the bronchus and lung: Secondary | ICD-10-CM

## 2017-05-12 DIAGNOSIS — S299XXA Unspecified injury of thorax, initial encounter: Secondary | ICD-10-CM | POA: Diagnosis not present

## 2017-05-12 NOTE — Progress Notes (Signed)
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 SURGICAL PROCEDURE: bronchoscopy left video-assisted thoracoscopy, left mini thoracotomy, left upper lobectomy with open suture closure of the left upper lobe bronchial stump, placement of On-Q and lymph node dissection. SURGEON: [name], MD.    History of Present Illness:    This is a 56 -year-old male seen in the office today in routine follow-up after resection of carcinoid tumor at the takeoff of his left upper lobe bronchus. The patient was referred for resection because of the concern that he might require a pneumonectomy to resect the tumor . In June 2016 he underwent resection of the right upper lobe with a carcinoid tumor and preservation of the lower lobe. Postoperatively the patient has done fine and  continues to work full-time in a [employer]. He notes some shortness of breath with exertion but never to the point of interfering with his activity level . He no longer smokes but did have greater than a 30-pack-year smoking history .  Zubrod Score: At the time of surgery this patient's most appropriate activity status/level should be described as: [x]     0    Normal activity, no symptoms []     1    Restricted in physical strenuous activity but ambulatory, able to do out light work []     2    Ambulatory and capable of self care, unable to do work activities, up and about                 >50 % of waking hours                                                                                    []     3    Only limited self care, in bed greater than 50% of waking hours []     4    Completely disabled, no self care, confined to bed or chair []     5    Moribund  History  Smoking Status  . Former Smoker  . Types: Cigarettes  . Quit date: 09/27/2013  Smokeless Tobacco  . Never Used    Comment: quit smoking in 2015(cig) and cigars(01/29/15)       No Known Allergies  Current Outpatient Prescriptions  Medication Sig Dispense Refill  . albuterol (PROVENTIL HFA;VENTOLIN HFA) 108 (90 Base) MCG/ACT inhaler Inhale 1-2 puffs into the lungs every 6 (six) hours as needed for wheezing or shortness of breath. 1 Inhaler 2  . atorvastatin (LIPITOR) 20 MG tablet Take 1 tablet (20 mg total) by mouth daily. 30 tablet 0  . budesonide-formoterol (  SYMBICORT) 160-4.5 MCG/ACT inhaler Inhale 2 puffs into the lungs 2 (two) times daily. 3 Inhaler 12  . Cholecalciferol (VITAMIN D) 2000 UNITS CAPS Take 1 capsule by mouth daily.    . Magnesium 200 MG TABS Take 1 tablet by mouth daily.    . Potassium 95 MG TABS Take 95 mg by mouth daily.     No current facility-administered medications for this visit.        Physical Exam: BP 135/77   Pulse 66   Resp 20   Ht 5\' 10"  (1.778 m)   Wt 218 lb (98.9 kg)   SpO2 97% Comment: RA  BMI 31.28 kg/m   General appearance: alert, cooperative and no distress Neurologic: intact Heart: regular rate and rhythm, S1, S2 normal, no murmur, click, rub or gallop Lungs: clear to auscultation bilaterally Abdomen: soft, non-tender; bowel sounds normal; no masses,  no organomegaly Extremities: extremities normal, atraumatic, no cyanosis or edema Wound: Incisions healing well   Diagnostic Studies & Laboratory data:         Recent Radiology Findings: Ct Chest Wo Contrast  Result Date: 05/12/2017 CLINICAL DATA:  Followup left upper lobe carcinoid tumor. Previous left upper lobectomy. EXAM: CT CHEST WITHOUT CONTRAST TECHNIQUE: Multidetector CT imaging of the chest  was performed following the standard protocol without IV contrast. COMPARISON:  11/06/2015 FINDINGS: Cardiovascular:  No acute findings. Aortic atherosclerosis. Mediastinum/Nodes: No masses or pathologically enlarged lymph nodes identified on this unenhanced exam. Lungs/Pleura: Stable postop changes from left upper lobectomy. No pulmonary infiltrate or mass identified. No effusion present. Upper Abdomen:  Unremarkable. Musculoskeletal: No suspicious bone lesions. Old compression deformities of the T8 and L1 vertebral bodies are again seen. 3.9 cm simple appearing cyst is seen in the upper inner quadrant of the right breast, which is unchanged and most consistent with a sebaceous cyst. IMPRESSION: Stable postop changes from left upper lobectomy. No evidence of recurrent or metastatic neoplasm. No acute findings. Aortic Atherosclerosis (ICD10-I70.0). Electronically Signed   By: Earle Gell M.D.   On: 05/12/2017 13:07   Ct Chest Wo Contrast  11/06/2015  CLINICAL DATA:  Left upper lobe lung cancer status post left upper lobectomy in 2016 EXAM: CT CHEST WITHOUT CONTRAST TECHNIQUE: Multidetector CT imaging of the chest was performed following the standard protocol without IV contrast. COMPARISON:  12/20/2014 FINDINGS: Mediastinum/Nodes: The heart is normal in size. No pericardial effusion. Mild atherosclerotic calcifications of the aortic arch. Small mediastinal lymph nodes including a 6 mm short axis prevascular node and a 7 mm short axis low right paratracheal node, within normal limits. Visualized thyroid is unremarkable. Lungs/Pleura: Status post left upper lobectomy. Lungs are essentially clear. No suspicious pulmonary nodules. Mild emphysematous changes. No focal consolidation. No pleural effusion or pneumothorax. Upper abdomen: Visualized upper abdomen is unremarkable. Musculoskeletal: 4.4 cm sebaceous cyst in the right anterior chest wall (series 3/image 26). Degenerative changes of the visualized thoracolumbar  spine. IMPRESSION: Status post left upper lobectomy. No findings suspicious for recurrent metastatic disease. Small mediastinal lymph nodes measuring up to 6-7 mm, within normal limits. Electronically Signed   By: Julian Hy M.D.   On: 11/06/2015 11:48      I have independently reviewed the above radiology findings and reviewed findings  with the patient.  Recent Labs: Lab Results  Component Value Date   WBC 7.3 10/11/2016   HGB 15.1 10/11/2016   HCT 43.7 10/11/2016   PLT 189 10/11/2016   GLUCOSE 142 (H) 10/11/2016   CHOL 193 10/11/2016  TRIG 197 (H) 10/11/2016   HDL 46 10/11/2016   LDLCALC 108 (H) 10/11/2016   ALT 31 10/11/2016   AST 18 10/11/2016   NA 139 10/11/2016   K 4.3 10/11/2016   CL 98 10/11/2016   CREATININE 0.94 10/11/2016   BUN 17 10/11/2016   CO2 26 10/11/2016   TSH 1.530 10/11/2016   INR 0.94 02/13/2015      Assessment / Plan:   Patient now 27 months following left upper lobectomy for a large carcinoid tumor, CT scan shows no evidence of recurrence, and preserved ventilation of the remaining lung. I discussed with the patient because of his previous smoking history that he would be referred to the [facility] lung cancer screening program to continue with follow-up low dose CTs of the chest.    I've not made a return appointment for him to see me but had to see him in his or his primary care physician requested anytime.  [name] 05/12/2017 1:43 PM

## 2017-05-16 ENCOUNTER — Encounter: Payer: 59 | Admitting: Unknown Physician Specialty

## 2017-05-30 ENCOUNTER — Other Ambulatory Visit: Payer: Self-pay | Admitting: Family Medicine

## 2017-05-30 NOTE — Telephone Encounter (Signed)
Your patient 

## 2017-07-05 ENCOUNTER — Other Ambulatory Visit: Payer: Self-pay | Admitting: Unknown Physician Specialty

## 2017-08-02 ENCOUNTER — Other Ambulatory Visit: Payer: Self-pay | Admitting: Family Medicine

## 2017-09-26 ENCOUNTER — Other Ambulatory Visit: Payer: Self-pay | Admitting: Unknown Physician Specialty

## 2017-10-13 ENCOUNTER — Ambulatory Visit (HOSPITAL_COMMUNITY): Payer: Self-pay | Admitting: Dentistry

## 2017-10-13 ENCOUNTER — Encounter (HOSPITAL_COMMUNITY): Payer: Self-pay | Admitting: Dentistry

## 2017-10-13 VITALS — BP 137/69 | HR 67 | Temp 97.7°F

## 2017-10-13 DIAGNOSIS — D3A090 Benign carcinoid tumor of the bronchus and lung: Secondary | ICD-10-CM

## 2017-10-13 DIAGNOSIS — D1430 Benign neoplasm of unspecified bronchus and lung: Secondary | ICD-10-CM | POA: Diagnosis not present

## 2017-10-13 DIAGNOSIS — Z972 Presence of dental prosthetic device (complete) (partial): Secondary | ICD-10-CM

## 2017-10-13 DIAGNOSIS — K08109 Complete loss of teeth, unspecified cause, unspecified class: Secondary | ICD-10-CM

## 2017-10-13 DIAGNOSIS — Z463 Encounter for fitting and adjustment of dental prosthetic device: Secondary | ICD-10-CM

## 2017-10-13 DIAGNOSIS — K0820 Unspecified atrophy of edentulous alveolar ridge: Secondary | ICD-10-CM

## 2017-10-13 NOTE — Patient Instructions (Signed)
Plan:  1. Return to dental medicine as scheduled. Call if problems arise before then.  2. Keep dentures out if sore spots arise. Use salt water rinses as needed to aid healing.  Lenn Cal, DDS

## 2017-10-13 NOTE — Progress Notes (Signed)
10/13/2017  Patient Name:   Timothy Benitez Date of Birth:   08/15/1961 Medical Record Number: [id number]  BP 137/69 (BP Location: Left Arm)   Pulse 67   Temp 97.7 F (36.5 C)   Timothy Benitez is a 57 year old male that presents for periodic oral examination and evaluation of upper and lower complete dentures. The patient has a history of carcinoid tumor of the lung. Patient was seen for evaluation of poor dentition in June 2016. Patient underwent extraction remaining teeth with alveoloplasty on 01/29/2015. Upper and lower complete dentures were fabricated and inserted on 04/24/2015. Patient was seen for multiple denture adjustment appointments. Patient now presents for his annual examination and evaluation of the upper and lower complete dentures.  The patient last saw Dr. Servando Snare on 05/12/2017 with no obvious persistence or recurrence.   Medical Hx Update:  Past Medical History:  Diagnosis Date  . COPD (chronic obstructive pulmonary disease) (HCC)    Symbicort daily and Albuterol prn  . History of bronchitis 6 months ago  . Hyperlipidemia    takes Atorvastatin daily  . Joint pain   . Lung cancer ([facility])   . Sleep apnea    "mild" never needed CPAP  .  Past Surgical History:  Procedure Laterality Date  . BRONCHOSCOPY    . ENDOBRONCHIAL ULTRASOUND N/A 01/02/2015   Procedure: ENDOBRONCHIAL ULTRASOUND;  Surgeon: Flora Lipps, MD;  Location: [facility];  Service: Cardiopulmonary;  Laterality: N/A;  . MULTIPLE EXTRACTIONS WITH ALVEOLOPLASTY N/A 01/29/2015   Procedure: Extraction of tooth #'s 2,3,4,5,6,7,8,9,10,11,12,13,14,15,18,20,21,22,23, 25,26,27,28,29,30 with alveoloplasty and mandibular right lateral exostoses reductions;  Surgeon: Lenn Cal, DDS;  Location: [facility];  Service: Oral Surgery;  Laterality: N/A;  . THORACOTOMY/LOBECTOMY Left 02/17/2015   WITH LEFT UPPER LOBECTOMY WITH LYMPH NODE DISSECTION, OPEN CLOSURE OF LEFT UPPER LOBE BRONCHUS, INTERCOSTAL MUSCLE FLAP COVERAGE OF  LEFT UPPER LOBE BRONCHIAL STUMP, PLACEMENT OF ON-Q (Left)  . TONSILLECTOMY  1969  . VASECTOMY  2001  . VIDEO ASSISTED THORACOSCOPY (VATS)/ LOBECTOMY Left 02/17/2015   Procedure: THORACOTOMY WITH LEFT UPPER LOBECTOMY WITH LYMPH NODE DISSECTION, OPEN CLOSURE OF LEFT UPPER LOBE BRONCHUS, INTERCOSTAL MUSCLE FLAP COVERAGE OF LEFT UPPER LOBE BRONCHIAL STUMP, PLACEMENT OF ON-Q;  Surgeon: [name], MD;  Location: [facility];  Service: Thoracic;  Laterality: Left;  Marland Kitchen VIDEO BRONCHOSCOPY  02/17/2015  . VIDEO BRONCHOSCOPY N/A 02/17/2015   Procedure: VIDEO BRONCHOSCOPY;  Surgeon: [name], MD;  Location: [facility];  Service: Thoracic;  Laterality: N/A;  . WISDOM TOOTH EXTRACTION      ALLERGIES/ADVERSE DRUG REACTIONS: No Known Allergies  MEDICATIONS: Current Outpatient Medications  Medication Sig Dispense Refill  . albuterol (PROVENTIL HFA;VENTOLIN HFA) 108 (90 Base) MCG/ACT inhaler Inhale 1-2 puffs into the lungs every 6 (six) hours as needed for wheezing or shortness of breath. 1 Inhaler 2  . atorvastatin (LIPITOR) 20 MG tablet Take 1 tablet (20 mg total) by mouth daily. 30 tablet 4  . Cholecalciferol (VITAMIN D) 2000 UNITS CAPS Take 1 capsule by mouth daily.    . Magnesium 200 MG TABS Take 1 tablet by mouth daily.    . Potassium 95 MG TABS Take 95 mg by mouth daily.    . SYMBICORT 160-4.5 MCG/ACT inhaler Inhale 2 puffs into the lungs 2 (two) times daily. 10.2 g 0   No current facility-administered medications for this visit.     C/C: Patient denies any problems with his dentures.   HPI:   Patient was seen for evaluation of  poor dentition in June of 2016. Patient underwent extraction of remaining teeth with alveoloplasty on 01/29/2015. Upper and lower complete dentures were fabricated and inserted on 04/24/2015. Patient was seen for multiple denture adjustment appointments. Patient now presents for his annual examination and evaluation of the upper and lower complete dentures.   DENTAL  EXAM: General: Patient is a well-developed, well-nourished male in no acute distress. Vitals: BP 137/69 (BP Location: Left Arm)   Pulse 67   Temp 97.7 F (36.5 C)  Extraoral Exam: There is no palpable neck lymphadenopathy. The patient denies acute TMJ symptoms. Intraoral  Exam: Patient has normal saliva. There is no evidence of denture irritation. Dentition: Patient is edentulous. Prosthodontic: The patient has upper and lower complete dentures that are stable and retentive. Pressure indicating paste was applied to dentures and minimal adjustments were made. Dentures were polished. Occlusion:  Patient has acceptable occlusion and centric relation and protrusive strokes. No adjustments were needed today.   Assessments: 1. History of carcinoid tumor of the lung 2. Patient is edentulous 3. Patient has upper and lower complete dentures that are stable and retentive   Plan:  1. Return to dental medicine as scheduled. Call if problems arise before then.  2. Keep dentures out if sore spots arise. Use salt water rinses as needed to aid healing.  Lenn Cal, DDS

## 2017-10-17 ENCOUNTER — Encounter (HOSPITAL_COMMUNITY): Payer: Self-pay | Admitting: Dentistry

## 2017-10-20 ENCOUNTER — Encounter (HOSPITAL_COMMUNITY): Payer: Self-pay | Admitting: Dentistry

## 2017-10-24 ENCOUNTER — Other Ambulatory Visit: Payer: Self-pay | Admitting: Unknown Physician Specialty

## 2017-11-21 ENCOUNTER — Other Ambulatory Visit: Payer: Self-pay | Admitting: Unknown Physician Specialty

## 2017-12-12 ENCOUNTER — Other Ambulatory Visit: Payer: Self-pay | Admitting: Family Medicine

## 2017-12-12 ENCOUNTER — Other Ambulatory Visit: Payer: Self-pay

## 2017-12-12 MED ORDER — BUDESONIDE-FORMOTEROL FUMARATE 160-4.5 MCG/ACT IN AERO: 2.0000 | INHALATION_SPRAY | Freq: Two times a day (BID) | RESPIRATORY_TRACT | 12 refills | Status: DC

## 2017-12-27 ENCOUNTER — Other Ambulatory Visit: Payer: Self-pay | Admitting: Family Medicine

## 2017-12-27 ENCOUNTER — Ambulatory Visit
Admission: RE | Admit: 2017-12-27 | Discharge: 2017-12-27 | Disposition: A | Discharge status: Home / Self Care | Payer: Worker's Compensation | Source: Ambulatory Visit | Attending: Family Medicine | Admitting: Family Medicine

## 2017-12-27 DIAGNOSIS — S86811A Strain of other muscle(s) and tendon(s) at lower leg level, right leg, initial encounter: Secondary | ICD-10-CM | POA: Insufficient documentation

## 2017-12-27 DIAGNOSIS — M79661 Pain in right lower leg: Secondary | ICD-10-CM

## 2018-01-11 ENCOUNTER — Other Ambulatory Visit: Payer: Self-pay | Admitting: Family Medicine

## 2018-03-08 ENCOUNTER — Other Ambulatory Visit: Payer: Self-pay | Admitting: Family Medicine

## 2018-03-16 ENCOUNTER — Other Ambulatory Visit: Payer: Self-pay

## 2018-03-16 ENCOUNTER — Encounter: Payer: Self-pay | Admitting: Family Medicine

## 2018-03-16 ENCOUNTER — Ambulatory Visit: Payer: 59 | Admitting: Family Medicine

## 2018-03-16 VITALS — BP 139/80 | HR 73 | Temp 98.3°F | Ht 71.0 in | Wt 221.3 lb

## 2018-03-16 DIAGNOSIS — E7800 Pure hypercholesterolemia, unspecified: Secondary | ICD-10-CM | POA: Diagnosis not present

## 2018-03-16 DIAGNOSIS — J449 Chronic obstructive pulmonary disease, unspecified: Secondary | ICD-10-CM | POA: Diagnosis not present

## 2018-03-16 NOTE — Patient Instructions (Signed)
Follow up in 6 months 

## 2018-03-16 NOTE — Progress Notes (Signed)
BP 139/80   Pulse 73   Temp 98.3 F (36.8 C) (Oral)   Ht 5\' 11"  (1.803 m)   Wt 221 lb 4.8 oz (100.4 kg)   SpO2 96%   BMI 30.87 kg/m    Subjective:    Patient ID: Timothy Benitez, male    DOB: 13-May-1961, 57 y.o.   MRN: [id number]  HPI: Timothy Benitez is a 57 y.o. male  Chief Complaint  Patient presents with  . Medication Refill    atorvastatin, symbicort   Pt here today for 6 month f/u. Taking lipitor without issue, compliant with doses. Denies Cp, SOB, claudication, myalgias. Breathing stable on symbicort and albuterol inhalers. No recent flares.   Relevant past medical, surgical, family and social history reviewed and updated as indicated. Interim medical history since our last visit reviewed. Allergies and medications reviewed and updated.  Review of Systems  Per HPI unless specifically indicated above     Objective:    BP 139/80   Pulse 73   Temp 98.3 F (36.8 C) (Oral)   Ht 5\' 11"  (1.803 m)   Wt 221 lb 4.8 oz (100.4 kg)   SpO2 96%   BMI 30.87 kg/m   Wt Readings from Last 3 Encounters:  03/16/18 221 lb 4.8 oz (100.4 kg)  05/12/17 218 lb (98.9 kg)  01/19/17 218 lb (98.9 kg)    Physical Exam  Constitutional: He is oriented to person, place, and time. He appears well-developed and well-nourished. No distress.  HENT:  Head: Atraumatic.  Eyes: Pupils are equal, round, and reactive to light. Conjunctivae are normal.  Neck: Normal range of motion. Neck supple.  Cardiovascular: Normal rate, regular rhythm and normal heart sounds.  Pulmonary/Chest: Effort normal and breath sounds normal. No respiratory distress.  Musculoskeletal: Normal range of motion.  Neurological: He is alert and oriented to person, place, and time.  Skin: Skin is warm and dry.  Psychiatric: He has a normal mood and affect. His behavior is normal.  Nursing note and vitals reviewed.   Results for orders placed or performed in visit on 03/16/18  Comprehensive metabolic panel  Result  Value Ref Range   Glucose 108 (H) 65 - 99 mg/dL   BUN 16 6 - 24 mg/dL   Creatinine, Ser 0.94 0.76 - 1.27 mg/dL   GFR calc non Af Amer 91 >59 mL/min/1.73   GFR calc Af Amer 105 >59 mL/min/1.73   BUN/Creatinine Ratio 17 9 - 20   Sodium 141 134 - 144 mmol/L   Potassium 4.9 3.5 - 5.2 mmol/L   Chloride 101 96 - 106 mmol/L   CO2 25 20 - 29 mmol/L   Calcium 9.3 8.7 - 10.2 mg/dL   Total Protein 6.6 6.0 - 8.5 g/dL   Albumin 4.5 3.5 - 5.5 g/dL   Globulin, Total 2.1 1.5 - 4.5 g/dL   Albumin/Globulin Ratio 2.1 1.2 - 2.2   Bilirubin Total 0.9 0.0 - 1.2 mg/dL   Alkaline Phosphatase 75 39 - 117 IU/L   AST 23 0 - 40 IU/L   ALT 37 0 - 44 IU/L  Lipid Panel w/o Chol/HDL Ratio  Result Value Ref Range   Cholesterol, Total 213 (H) 100 - 199 mg/dL   Triglycerides 300 (H) 0 - 149 mg/dL   HDL 52 >39 mg/dL   VLDL Cholesterol Cal 60 (H) 5 - 40 mg/dL   LDL Calculated 101 (H) 0 - 99 mg/dL      Assessment & Plan:   Problem  List Items Addressed This Visit      Respiratory   COPD (chronic obstructive pulmonary disease) ([facility]) - Primary    Stable without recent flares on symbicort and albuterol. Continue current regimen        Other   Hyperlipidemia    Stable, continue current regimen. Recheck lipids and CMP and adjust if needed      Relevant Orders   Comprehensive metabolic panel (Completed)   Lipid Panel w/o Chol/HDL Ratio (Completed)       Follow up plan: Return in about 6 months (around 09/16/2018) for CPE.

## 2018-03-17 LAB — COMPREHENSIVE METABOLIC PANEL
A/G RATIO: 2.1 (ref 1.2–2.2)
ALK PHOS: 75 IU/L (ref 39–117)
ALT: 37 IU/L (ref 0–44)
AST: 23 IU/L (ref 0–40)
Albumin: 4.5 g/dL (ref 3.5–5.5)
BILIRUBIN TOTAL: 0.9 mg/dL (ref 0.0–1.2)
BUN/Creatinine Ratio: 17 (ref 9–20)
BUN: 16 mg/dL (ref 6–24)
CALCIUM: 9.3 mg/dL (ref 8.7–10.2)
CHLORIDE: 101 mmol/L (ref 96–106)
CO2: 25 mmol/L (ref 20–29)
Creatinine, Ser: 0.94 mg/dL (ref 0.76–1.27)
GFR calc Af Amer: 105 mL/min/{1.73_m2} (ref >59)
GFR, EST NON AFRICAN AMERICAN: 91 mL/min/{1.73_m2} (ref >59)
Globulin, Total: 2.1 g/dL (ref 1.5–4.5)
Glucose: 108 mg/dL — ABNORMAL HIGH (ref 65–99)
POTASSIUM: 4.9 mmol/L (ref 3.5–5.2)
SODIUM: 141 mmol/L (ref 134–144)
Total Protein: 6.6 g/dL (ref 6.0–8.5)

## 2018-03-17 LAB — LIPID PANEL W/O CHOL/HDL RATIO
Cholesterol, Total: 213 mg/dL — ABNORMAL HIGH (ref 100–199)
HDL: 52 mg/dL (ref >39)
LDL Calculated: 101 mg/dL — ABNORMAL HIGH (ref 0–99)
TRIGLYCERIDES: 300 mg/dL — AB (ref 0–149)
VLDL Cholesterol Cal: 60 mg/dL — ABNORMAL HIGH (ref 5–40)

## 2018-03-19 NOTE — Assessment & Plan Note (Signed)
Stable without recent flares on symbicort and albuterol. Continue current regimen

## 2018-03-19 NOTE — Assessment & Plan Note (Signed)
Stable, continue current regimen. Recheck lipids and CMP and adjust if needed

## 2018-03-20 ENCOUNTER — Ambulatory Visit: Payer: 59 | Admitting: Unknown Physician Specialty

## 2018-04-05 ENCOUNTER — Other Ambulatory Visit: Payer: Self-pay | Admitting: Family Medicine

## 2018-04-05 DIAGNOSIS — E7800 Pure hypercholesterolemia, unspecified: Secondary | ICD-10-CM

## 2018-04-05 NOTE — Telephone Encounter (Signed)
Lipitor refill Last Refill:03/09/18 # 30 Last OV: 03/16/18 with Merrie Roof.   No future appts noted. PCP: Terrilee Croak (was Julian Hy) Pharmacy:[facility]

## 2018-05-10 ENCOUNTER — Encounter: Payer: Self-pay | Admitting: *Deleted

## 2018-05-10 ENCOUNTER — Telehealth: Payer: Self-pay | Admitting: *Deleted

## 2018-05-10 DIAGNOSIS — Z122 Encounter for screening for malignant neoplasm of respiratory organs: Secondary | ICD-10-CM

## 2018-05-10 NOTE — Telephone Encounter (Signed)
Received a referral for initial lung cancer screening scan.  Contacted the patient and obtained their smoking history, former smoker quit 2015 [id number] history  as well as answering questions related to screening process.  Patient denies signs of lung cancer such as weight loss or hemoptysis at this time.  Patient denies comorbidity that would prevent curative treatment if lung cancer were found.  Patient is calling back when he finds out his schedule to make an appt for sdm and scan

## 2018-05-12 ENCOUNTER — Ambulatory Visit
Admission: RE | Admit: 2018-05-12 | Discharge: 2018-05-12 | Disposition: A | Discharge status: Home / Self Care | Payer: 59 | Source: Ambulatory Visit | Attending: Nurse Practitioner | Admitting: Nurse Practitioner

## 2018-05-12 ENCOUNTER — Inpatient Hospital Stay: Payer: 59 | Attending: Oncology | Admitting: Oncology

## 2018-05-12 DIAGNOSIS — J432 Centrilobular emphysema: Secondary | ICD-10-CM | POA: Diagnosis not present

## 2018-05-12 DIAGNOSIS — Z122 Encounter for screening for malignant neoplasm of respiratory organs: Secondary | ICD-10-CM

## 2018-05-12 DIAGNOSIS — I700 Atherosclerosis of aorta: Secondary | ICD-10-CM | POA: Insufficient documentation

## 2018-05-12 DIAGNOSIS — Z87891 Personal history of nicotine dependence: Secondary | ICD-10-CM | POA: Insufficient documentation

## 2018-05-12 DIAGNOSIS — J438 Other emphysema: Secondary | ICD-10-CM | POA: Diagnosis not present

## 2018-05-12 DIAGNOSIS — I2510 Atherosclerotic heart disease of native coronary artery without angina pectoris: Secondary | ICD-10-CM | POA: Diagnosis not present

## 2018-05-12 NOTE — Progress Notes (Signed)
In accordance with CMS guidelines, patient has met eligibility criteria including age, absence of signs or symptoms of lung cancer.  Social History   Tobacco Use  . Smoking status: Former Smoker    Packs/day: 2.00    Years: 35.00    Pack years: 70.00    Types: Cigarettes    Last attempt to quit: 09/27/2013    Years since quitting: 4.6  . Smokeless tobacco: Never Used  . Tobacco comment: quit smoking in 2015(cig) and cigars(01/29/15)  Substance Use Topics  . Alcohol use: Yes    Alcohol/week: 1.0 standard drinks    Types: 1 Cans of beer per week    Comment: occasionally  . Drug use: No     A shared decision-making session was conducted prior to the performance of CT scan. This includes one or more decision aids, includes benefits and harms of screening, follow-up diagnostic testing, over-diagnosis, false positive rate, and total radiation exposure.  Counseling on the importance of adherence to annual lung cancer LDCT screening, impact of co-morbidities, and ability or willingness to undergo diagnosis and treatment is imperative for compliance of the program.  Counseling on the importance of continued smoking cessation for former smokers; the importance of smoking cessation for current smokers, and information about tobacco cessation interventions have been given to patient including [place] and [employer] quit [place] programs.  Written order for lung cancer screening with LDCT has been given to the patient and any and all questions have been answered to the best of my abilities.   Yearly follow up will be coordinated by Burgess Estelle, Thoracic Navigator.  Faythe Casa, NP 05/12/2018 2:17 PM

## 2018-05-15 ENCOUNTER — Encounter: Payer: Self-pay | Admitting: *Deleted

## 2018-05-15 ENCOUNTER — Telehealth: Payer: Self-pay | Admitting: *Deleted

## 2018-05-15 NOTE — Telephone Encounter (Signed)
Notified patient of LDCT lung cancer screening program results with recommendation for 12 month follow up imaging.  Also notified of incidental findings noted below and is encouraged to discuss further questions with PCP who will receive a copy of this not and/or the CT reports.  Patient verbalized understanding.    IMPRESSION: 1. Lung-RADS 2S, benign appearance or behavior. Continue annual screening with low-dose chest CT without contrast in 12 months. 2. The "S" modifier above refers to potentially clinically significant non lung cancer related findings. Specifically, there is aortic atherosclerosis, in addition to left circumflex coronary artery disease. Please note that although the presence of coronary artery calcium documents the presence of coronary artery disease, the severity of this disease and any potential stenosis cannot be assessed on this non-gated CT examination. Assessment for potential risk factor modification, dietary therapy or pharmacologic therapy may be warranted, if clinically indicated. 3. Mild diffuse bronchial wall thickening with very mild centrilobular and paraseptal emphysema; imaging findings suggestive of underlying COPD.  Aortic Atherosclerosis (ICD10-I70.0) and Emphysema (ICD10-J43.9).

## 2018-07-10 ENCOUNTER — Other Ambulatory Visit: Payer: Self-pay | Admitting: Physician Assistant

## 2018-07-10 DIAGNOSIS — E7800 Pure hypercholesterolemia, unspecified: Secondary | ICD-10-CM

## 2018-07-10 NOTE — Telephone Encounter (Signed)
Refill request approved

## 2018-08-07 ENCOUNTER — Other Ambulatory Visit: Payer: Self-pay | Admitting: Nurse Practitioner

## 2018-08-07 DIAGNOSIS — E7800 Pure hypercholesterolemia, unspecified: Secondary | ICD-10-CM

## 2018-09-05 ENCOUNTER — Other Ambulatory Visit: Payer: Self-pay | Admitting: Nurse Practitioner

## 2018-09-05 DIAGNOSIS — E7800 Pure hypercholesterolemia, unspecified: Secondary | ICD-10-CM

## 2018-10-16 ENCOUNTER — Ambulatory Visit (HOSPITAL_COMMUNITY): Payer: Self-pay | Admitting: Dentistry

## 2018-10-16 ENCOUNTER — Encounter (HOSPITAL_COMMUNITY): Payer: Self-pay | Admitting: Dentistry

## 2018-10-16 ENCOUNTER — Other Ambulatory Visit: Payer: Self-pay | Admitting: Nurse Practitioner

## 2018-10-16 VITALS — BP 149/64 | HR 75 | Temp 97.9°F

## 2018-10-16 DIAGNOSIS — D3A090 Benign carcinoid tumor of the bronchus and lung: Secondary | ICD-10-CM

## 2018-10-16 DIAGNOSIS — K0820 Unspecified atrophy of edentulous alveolar ridge: Secondary | ICD-10-CM

## 2018-10-16 DIAGNOSIS — K08109 Complete loss of teeth, unspecified cause, unspecified class: Secondary | ICD-10-CM

## 2018-10-16 DIAGNOSIS — Z972 Presence of dental prosthetic device (complete) (partial): Secondary | ICD-10-CM

## 2018-10-16 DIAGNOSIS — Z463 Encounter for fitting and adjustment of dental prosthetic device: Secondary | ICD-10-CM

## 2018-10-16 DIAGNOSIS — E7800 Pure hypercholesterolemia, unspecified: Secondary | ICD-10-CM

## 2018-10-16 DIAGNOSIS — D1430 Benign neoplasm of unspecified bronchus and lung: Secondary | ICD-10-CM | POA: Diagnosis not present

## 2018-10-16 NOTE — Progress Notes (Signed)
10/16/2018  Patient Name:   Timothy Benitez Date of Birth:   1960-10-09 Medical Record Number: [id number]  BP (!) 149/64 (BP Location: Left Arm)   Pulse 75   Temp 97.9 F (36.6 C)   Timothy Benitez is a 58 year old male that presents for periodic oral examination and evaluation of upper and lower complete dentures. The patient has a history of carcinoid tumor of the lung. Patient was seen for evaluation of poor dentition in June 2016. Patient underwent extraction remaining teeth with alveoloplasty on 01/29/2015. Upper and lower complete dentures were fabricated and inserted on 04/24/2015. Patient was seen for multiple denture adjustment appointments. Patient now presents for his annual examination and evaluation of the upper and lower complete dentures. The patient last seen for denture recall and February 2019.  Patient has been seen recently by nurse practitioner and physicians assistant in the thoracic program.  Patient Active Problem List   Diagnosis Date Noted  . Carcinoid tumor of lung 01/22/2015    Priority: Medium  . Hyperlipidemia 10/07/2015  . COPD (chronic obstructive pulmonary disease) ([place]) 01/02/2015    Medical Hx Update:  Past Medical History:  Diagnosis Date  . COPD (chronic obstructive pulmonary disease) ([facility])    Symbicort daily and Albuterol prn  . History of bronchitis 6 months ago  . Hyperlipidemia    takes Atorvastatin daily  . Joint pain   . Lung cancer ([place])   . Sleep apnea    "mild" never needed CPAP  .  Past Surgical History:  Procedure Laterality Date  . BRONCHOSCOPY    . ENDOBRONCHIAL ULTRASOUND N/A 01/02/2015   Procedure: ENDOBRONCHIAL ULTRASOUND;  Surgeon: Flora Lipps, MD;  Location: [facility];  Service: Cardiopulmonary;  Laterality: N/A;  . MULTIPLE EXTRACTIONS WITH ALVEOLOPLASTY N/A 01/29/2015   Procedure: Extraction of tooth #'s 2,3,4,5,6,7,8,9,10,11,12,13,14,15,18,20,21,22,23, 25,26,27,28,29,30 with alveoloplasty and mandibular right lateral  exostoses reductions;  Surgeon: Lenn Cal, DDS;  Location: [place];  Service: Oral Surgery;  Laterality: N/A;  . THORACOTOMY/LOBECTOMY Left 02/17/2015   WITH LEFT UPPER LOBECTOMY WITH LYMPH NODE DISSECTION, OPEN CLOSURE OF LEFT UPPER LOBE BRONCHUS, INTERCOSTAL MUSCLE FLAP COVERAGE OF LEFT UPPER LOBE BRONCHIAL STUMP, PLACEMENT OF ON-Q (Left)  . TONSILLECTOMY  1969  . VASECTOMY  2001  . VIDEO ASSISTED THORACOSCOPY (VATS)/ LOBECTOMY Left 02/17/2015   Procedure: THORACOTOMY WITH LEFT UPPER LOBECTOMY WITH LYMPH NODE DISSECTION, OPEN CLOSURE OF LEFT UPPER LOBE BRONCHUS, INTERCOSTAL MUSCLE FLAP COVERAGE OF LEFT UPPER LOBE BRONCHIAL STUMP, PLACEMENT OF ON-Q;  Surgeon: [name], MD;  Location: [place];  Service: Thoracic;  Laterality: Left;  Marland Kitchen VIDEO BRONCHOSCOPY  02/17/2015  . VIDEO BRONCHOSCOPY N/A 02/17/2015   Procedure: VIDEO BRONCHOSCOPY;  Surgeon: [name], MD;  Location: [facility] OR;  Service: Thoracic;  Laterality: N/A;  . WISDOM TOOTH EXTRACTION      ALLERGIES/ADVERSE DRUG REACTIONS: No Known Allergies  MEDICATIONS: Current Outpatient Medications  Medication Sig Dispense Refill  . albuterol (PROVENTIL HFA;VENTOLIN HFA) 108 (90 Base) MCG/ACT inhaler Inhale 1-2 puffs into the lungs every 6 (six) hours as needed for wheezing or shortness of breath. 1 Inhaler 2  . atorvastatin (LIPITOR) 20 MG tablet Take 1 tablet (20 mg total) by mouth daily. 90 tablet 0  . budesonide-formoterol (SYMBICORT) 160-4.5 MCG/ACT inhaler Inhale 2 puffs into the lungs 2 (two) times daily. 10.2 g 12  . Cholecalciferol (VITAMIN D) 2000 UNITS CAPS Take 1 capsule by mouth daily.    . Magnesium 200 MG TABS Take 1 tablet by mouth  daily.    . Potassium 95 MG TABS Take 95 mg by mouth daily.     No current facility-administered medications for this visit.     C/C: Patient denies any problems with his dentures.   HPI:   Patient was seen for evaluation of poor dentition in June of 2016. Patient underwent  extraction of remaining teeth with alveoloplasty on 01/29/2015. Upper and lower complete dentures were fabricated and inserted on 04/24/2015. Patient was seen for multiple denture adjustment appointments. Patient now presents for his annual examination and evaluation of the upper and lower complete dentures.   DENTAL EXAM: General: Patient is a well-developed, well-nourished male in no acute distress. Vitals: BP (!) 149/64 (BP Location: Left Arm)   Pulse 75   Temp 97.9 F (36.6 C)  Extraoral Exam: There is no palpable neck lymphadenopathy. The patient denies acute TMJ symptoms. Intraoral  Exam: Patient has normal saliva. There is no evidence of denture irritation. Dentition: Patient is edentulous. Prosthodontic: The patient has upper and lower complete dentures that are stable and retentive. Pressure indicating paste was applied to dentures and minimal adjustments were made. Dentures were polished. Occlusion:  Patient has acceptable occlusion and centric relation and protrusive strokes. No adjustments were needed today.   Assessments: 1. History of carcinoid tumor of the lung 2. Patient is edentulous 3. Patient has upper and lower complete dentures that are stable and retentive   Plan:  1. Return to dental medicine as scheduled. Call if problems arise before then.  2. Keep dentures out if sore spots arise. Use salt water rinses as needed to aid healing.  Lenn Cal, DDS

## 2018-10-16 NOTE — Telephone Encounter (Signed)
Labs drawn 7/19 and reviewed as normal continue medication- patient was due CPE -1/20 Requested Prescriptions  Pending Prescriptions Disp Refills  . atorvastatin (LIPITOR) 20 MG tablet [Pharmacy Med Name: ATORVASTATIN 20 MG TABLET] 30 tablet 5    Sig: Take 1 tablet (20 mg total) by mouth daily.     Cardiovascular:  Antilipid - Statins Failed - 10/16/2018 12:02 PM      Failed - Total Cholesterol in normal range and within 360 days    Cholesterol, Total  Date Value Ref Range Status  03/16/2018 213 (H) 100 - 199 mg/dL Final         Failed - LDL in normal range and within 360 days    LDL Calculated  Date Value Ref Range Status  03/16/2018 101 (H) 0 - 99 mg/dL Final         Failed - Triglycerides in normal range and within 360 days    Triglycerides  Date Value Ref Range Status  03/16/2018 300 (H) 0 - 149 mg/dL Final         Passed - HDL in normal range and within 360 days    HDL  Date Value Ref Range Status  03/16/2018 52 >39 mg/dL Final         Passed - Patient is not pregnant      Passed - Valid encounter within last 12 months    Recent Outpatient Visits          7 months ago Chronic obstructive pulmonary disease, unspecified COPD type [facility])   [place], Rachel Elizabeth, PA-C   1 year ago Strain of neck muscle, initial encounter   [facility] Volney American, [place]   1 year ago Tendinitis of right shoulder   [place] [name], NP   2 years ago Routine general medical examination at a health care facility   [facility] [name], NP   2 years ago Health care maintenance   [place], NP

## 2018-10-16 NOTE — Patient Instructions (Signed)
Plan:  1. Return to dental medicine as scheduled. Call if problems arise before then.  2. Keep dentures out if sore spots arise. Use salt water rinses as needed to aid healing.  Lenn Cal, DDS

## 2019-01-16 ENCOUNTER — Other Ambulatory Visit: Payer: Self-pay | Admitting: Unknown Physician Specialty

## 2019-04-10 ENCOUNTER — Other Ambulatory Visit: Payer: Self-pay | Admitting: Unknown Physician Specialty

## 2019-04-10 ENCOUNTER — Other Ambulatory Visit: Payer: Self-pay | Admitting: Nurse Practitioner

## 2019-04-10 DIAGNOSIS — E7800 Pure hypercholesterolemia, unspecified: Secondary | ICD-10-CM

## 2019-04-10 NOTE — Telephone Encounter (Signed)
Your patient 

## 2019-04-10 NOTE — Telephone Encounter (Signed)
Called and spoke to patient. Scheduled an OV for 04/23/19 at 8:45 am

## 2019-04-10 NOTE — Telephone Encounter (Signed)
Will refill inhaler, but patient does need follow-up scheduled.  Has not been seen in one year.

## 2019-04-10 NOTE — Telephone Encounter (Signed)
Requested medication (s) are due for refill today: yes  Requested medication (s) are on the active medication list: yes  Last refill:  01/16/19 with 1 refill  Future visit scheduled: No  Notes to clinic:  Last OV 03/16/18. Pt will need to be scheduled for appt for further refills.    Requested Prescriptions  Pending Prescriptions Disp Refills   SYMBICORT 160-4.5 MCG/ACT inhaler [Pharmacy Med Name: SYMBICORT 160-4.5 MCG INHALER] 10.2 g 0    Sig: Inhale 2 puffs into the lungs 2 (two) times daily.     There is no refill protocol information for this order

## 2019-04-20 ENCOUNTER — Ambulatory Visit: Payer: Self-pay | Admitting: Nurse Practitioner

## 2019-04-23 ENCOUNTER — Ambulatory Visit: Payer: Self-pay | Admitting: Nurse Practitioner

## 2019-04-24 ENCOUNTER — Other Ambulatory Visit: Payer: Self-pay

## 2019-04-24 DIAGNOSIS — E7800 Pure hypercholesterolemia, unspecified: Secondary | ICD-10-CM

## 2019-04-24 NOTE — Telephone Encounter (Signed)
Patient has upcoming appointment 05/02/19.

## 2019-04-25 MED ORDER — ATORVASTATIN CALCIUM 20 MG PO TABS: 20.0000 mg | ORAL_TABLET | Freq: Every day | ORAL | 0 refills | Status: DC

## 2019-05-02 ENCOUNTER — Ambulatory Visit: Payer: BC Managed Care – PPO | Admitting: Nurse Practitioner

## 2019-05-02 ENCOUNTER — Encounter: Payer: Self-pay | Admitting: Nurse Practitioner

## 2019-05-02 ENCOUNTER — Other Ambulatory Visit: Payer: Self-pay

## 2019-05-02 VITALS — BP 132/72 | HR 72 | Temp 98.0°F | Ht 71.0 in | Wt 210.0 lb

## 2019-05-02 DIAGNOSIS — E7800 Pure hypercholesterolemia, unspecified: Secondary | ICD-10-CM | POA: Diagnosis not present

## 2019-05-02 DIAGNOSIS — J449 Chronic obstructive pulmonary disease, unspecified: Secondary | ICD-10-CM

## 2019-05-02 MED ORDER — ATORVASTATIN CALCIUM 20 MG PO TABS: 20.0000 mg | ORAL_TABLET | Freq: Every day | ORAL | 3 refills | Status: DC

## 2019-05-02 MED ORDER — BUDESONIDE-FORMOTEROL FUMARATE 160-4.5 MCG/ACT IN AERO: INHALATION_SPRAY | RESPIRATORY_TRACT | 3 refills | Status: DC

## 2019-05-02 NOTE — Assessment & Plan Note (Signed)
Chronic, well controlled.  Continue current medication regimen.  Refills sent.  Plan on spirometry next visit.

## 2019-05-02 NOTE — Assessment & Plan Note (Signed)
Chronic, ongoing.  Continue current medication regimen and adjust as needed.  CMP and lipid panel today.

## 2019-05-02 NOTE — Progress Notes (Signed)
BP 132/72   Pulse 72   Temp 98 F (36.7 C) (Oral)   Ht 5\' 11"  (1.803 m)   Wt 210 lb (95.3 kg)   SpO2 100%   BMI 29.29 kg/m    Subjective:    Patient ID: Timothy Benitez, male    DOB: [birth year], 58 y.o.   MRN: [id number]  HPI: Timothy Benitez is a 58 y.o. male  Chief Complaint  Patient presents with  . COPD    symbicort refill  . Hyperlipidemia    atorvastatin refill. patient states that he has been out of atorvastatin for about 2 weeks   COPD Continues on Symbicort and Albuterol.  History of lung CA, had surgery, 2016.  Upper left lobe removed.  Past smoker, quit in 2015. COPD status: stable Satisfied with current treatment?: yes Oxygen use: no Dyspnea frequency: none Cough frequency: none Rescue inhaler frequency: has not used in long time Limitation of activity: no Productive cough: none Last Spirometry: 2016 Pneumovax: Up to Date Influenza: Up to Date   HYPERLIPIDEMIA Continues on Lipitor. Hyperlipidemia status: good compliance Satisfied with current treatment? yes Side effects:  no Medication compliance: good compliance Past cholesterol meds: Lipitor Supplements: none Aspirin:  no The 10-year ASCVD risk score Mikey Bussing DC Jr., et al., 2013) is: 6.7%   Values used to calculate the score:     Age: 23 years     Sex: Male     Is Non-Hispanic African American: No     Diabetic: No     Tobacco smoker: No     Systolic Blood Pressure: 341 mmHg     Is BP treated: No     HDL Cholesterol: 52 mg/dL     Total Cholesterol: 213 mg/dL Chest pain:  no Coronary artery disease:  no Family history CAD:  no Family history early CAD:  no  Relevant past medical, surgical, family and social history reviewed and updated as indicated. Interim medical history since our last visit reviewed. Allergies and medications reviewed and updated.  Review of Systems  Constitutional: Negative for activity change, diaphoresis, fatigue and fever.  Respiratory: Negative for cough, chest  tightness, shortness of breath and wheezing.   Cardiovascular: Negative for chest pain, palpitations and leg swelling.  Gastrointestinal: Negative for abdominal distention, abdominal pain, constipation, diarrhea, nausea and vomiting.  Neurological: Negative for dizziness, syncope, weakness, light-headedness, numbness and headaches.  Psychiatric/Behavioral: Negative.     Per HPI unless specifically indicated above     Objective:    BP 132/72   Pulse 72   Temp 98 F (36.7 C) (Oral)   Ht 5\' 11"  (1.803 m)   Wt 210 lb (95.3 kg)   SpO2 100%   BMI 29.29 kg/m   Wt Readings from Last 3 Encounters:  05/02/19 210 lb (95.3 kg)  05/12/18 221 lb (100.2 kg)  03/16/18 221 lb 4.8 oz (100.4 kg)    Physical Exam Vitals signs and nursing note reviewed.  Constitutional:      General: He is awake. He is not in acute distress.    Appearance: He is well-developed and overweight. He is not ill-appearing.  HENT:     Head: Normocephalic and atraumatic.     Right Ear: Hearing normal. No drainage.     Left Ear: Hearing normal. No drainage.  Eyes:     General: Lids are normal.        Right eye: No discharge.        Left eye: No discharge.  Conjunctiva/sclera: Conjunctivae normal.     Pupils: Pupils are equal, round, and reactive to light.  Neck:     Musculoskeletal: Normal range of motion and neck supple.     Thyroid: No thyromegaly.     Vascular: No carotid bruit.  Cardiovascular:     Rate and Rhythm: Normal rate and regular rhythm.     Heart sounds: Normal heart sounds, S1 normal and S2 normal. No murmur. No gallop.   Pulmonary:     Effort: Pulmonary effort is normal. No accessory muscle usage or respiratory distress.     Breath sounds: Normal breath sounds.  Abdominal:     General: Bowel sounds are normal.     Palpations: Abdomen is soft.  Musculoskeletal: Normal range of motion.     Right lower leg: No edema.     Left lower leg: No edema.  Skin:    General: Skin is warm and dry.   Neurological:     Mental Status: He is alert and oriented to person, place, and time.     Deep Tendon Reflexes: Reflexes are normal and symmetric.  Psychiatric:        Mood and Affect: Mood normal.        Behavior: Behavior normal. Behavior is cooperative.        Thought Content: Thought content normal.        Judgment: Judgment normal.     Results for orders placed or performed in visit on 03/16/18  Comprehensive metabolic panel  Result Value Ref Range   Glucose 108 (H) 65 - 99 mg/dL   BUN 16 6 - 24 mg/dL   Creatinine, Ser 0.94 0.76 - 1.27 mg/dL   GFR calc non Af Amer 91 >59 mL/min/1.73   GFR calc Af Amer 105 >59 mL/min/1.73   BUN/Creatinine Ratio 17 9 - 20   Sodium 141 134 - 144 mmol/L   Potassium 4.9 3.5 - 5.2 mmol/L   Chloride 101 96 - 106 mmol/L   CO2 25 20 - 29 mmol/L   Calcium 9.3 8.7 - 10.2 mg/dL   Total Protein 6.6 6.0 - 8.5 g/dL   Albumin 4.5 3.5 - 5.5 g/dL   Globulin, Total 2.1 1.5 - 4.5 g/dL   Albumin/Globulin Ratio 2.1 1.2 - 2.2   Bilirubin Total 0.9 0.0 - 1.2 mg/dL   Alkaline Phosphatase 75 39 - 117 IU/L   AST 23 0 - 40 IU/L   ALT 37 0 - 44 IU/L  Lipid Panel w/o Chol/HDL Ratio  Result Value Ref Range   Cholesterol, Total 213 (H) 100 - 199 mg/dL   Triglycerides 300 (H) 0 - 149 mg/dL   HDL 52 >39 mg/dL   VLDL Cholesterol Cal 60 (H) 5 - 40 mg/dL   LDL Calculated 101 (H) 0 - 99 mg/dL      Assessment & Plan:   Problem List Items Addressed This Visit      Respiratory   COPD (chronic obstructive pulmonary disease) ([place]) - Primary    Chronic, well controlled.  Continue current medication regimen.  Refills sent.  Plan on spirometry next visit.      Relevant Medications   budesonide-formoterol (SYMBICORT) 160-4.5 MCG/ACT inhaler     Other   Hyperlipidemia    Chronic, ongoing.  Continue current medication regimen and adjust as needed.  CMP and lipid panel today.      Relevant Medications   atorvastatin (LIPITOR) 20 MG tablet   Other Relevant Orders    Comprehensive metabolic panel  Lipid Panel w/o Chol/HDL Ratio       Follow up plan: Return in about 6 months (around 10/30/2019) for Annual physical with spirometry.

## 2019-05-02 NOTE — Patient Instructions (Signed)
Fat and Cholesterol Restricted Eating Plan Getting too much fat and cholesterol in your diet may cause health problems. Choosing the right foods helps keep your fat and cholesterol at normal levels. This can keep you from getting certain diseases. Your doctor may recommend an eating plan that includes:  Total fat: ______% or less of total calories a day.  Saturated fat: ______% or less of total calories a day.  Cholesterol: less than _________mg a day.  Fiber: ______g a day. What are tips for following this plan? Meal planning  At meals, divide your plate into four equal parts: ? Fill one-half of your plate with vegetables and green salads. ? Fill one-fourth of your plate with whole grains. ? Fill one-fourth of your plate with low-fat (lean) protein foods.  Eat fish that is high in omega-3 fats at least two times a week. This includes mackerel, tuna, sardines, and salmon.  Eat foods that are high in fiber, such as whole grains, beans, apples, broccoli, carrots, peas, and barley. General tips   Work with your doctor to lose weight if you need to.  Avoid: ? Foods with added sugar. ? Fried foods. ? Foods with partially hydrogenated oils.  Limit alcohol intake to no more than 1 drink a day for nonpregnant women and 2 drinks a day for men. One drink equals 12 oz of beer, 5 oz of wine, or 1 oz of hard liquor. Reading food labels  Check food labels for: ? Trans fats. ? Partially hydrogenated oils. ? Saturated fat (g) in each serving. ? Cholesterol (mg) in each serving. ? Fiber (g) in each serving.  Choose foods with healthy fats, such as: ? Monounsaturated fats. ? Polyunsaturated fats. ? Omega-3 fats.  Choose grain products that have whole grains. Look for the word "whole" as the first word in the ingredient list. Cooking  Cook foods using low-fat methods. These include baking, boiling, grilling, and broiling.  Eat more home-cooked foods. Eat at restaurants and buffets  less often.  Avoid cooking using saturated fats, such as butter, cream, palm oil, palm kernel oil, and coconut oil. Recommended foods  Fruits  All fresh, canned (in natural juice), or frozen fruits. Vegetables  Fresh or frozen vegetables (raw, steamed, roasted, or grilled). Green salads. Grains  Whole grains, such as whole wheat or whole grain breads, crackers, cereals, and pasta. Unsweetened oatmeal, bulgur, barley, quinoa, or brown rice. Corn or whole wheat flour tortillas. Meats and other protein foods  Ground beef (85% or leaner), grass-fed beef, or beef trimmed of fat. Skinless chicken or turkey. Ground chicken or turkey. Pork trimmed of fat. All fish and seafood. Egg whites. Dried beans, peas, or lentils. Unsalted nuts or seeds. Unsalted canned beans. Nut butters without added sugar or oil. Dairy  Low-fat or nonfat dairy products, such as skim or 1% milk, 2% or reduced-fat cheeses, low-fat and fat-free ricotta or cottage cheese, or plain low-fat and nonfat yogurt. Fats and oils  Tub margarine without trans fats. Light or reduced-fat mayonnaise and salad dressings. Avocado. Olive, canola, sesame, or safflower oils. The items listed above may not be a complete list of foods and beverages you can eat. Contact a dietitian for more information. Foods to avoid Fruits  Canned fruit in heavy syrup. Fruit in cream or butter sauce. Fried fruit. Vegetables  Vegetables cooked in cheese, cream, or butter sauce. Fried vegetables. Grains  White bread. White pasta. White rice. Cornbread. Bagels, pastries, and croissants. Crackers and snack foods that contain trans fat   and hydrogenated oils. Meats and other protein foods  Fatty cuts of meat. Ribs, chicken wings, bacon, sausage, bologna, salami, chitterlings, fatback, hot dogs, bratwurst, and packaged lunch meats. Liver and organ meats. Whole eggs and egg yolks. Chicken and turkey with skin. Fried meat. Dairy  Whole or 2% milk, cream,  half-and-half, and cream cheese. Whole milk cheeses. Whole-fat or sweetened yogurt. Full-fat cheeses. Nondairy creamers and whipped toppings. Processed cheese, cheese spreads, and cheese curds. Beverages  Alcohol. Sugar-sweetened drinks such as sodas, lemonade, and fruit drinks. Fats and oils  Butter, stick margarine, lard, shortening, ghee, or bacon fat. Coconut, palm kernel, and palm oils. Sweets and desserts  Corn syrup, sugars, honey, and molasses. Candy. Jam and jelly. Syrup. Sweetened cereals. Cookies, pies, cakes, donuts, muffins, and ice cream. The items listed above may not be a complete list of foods and beverages you should avoid. Contact a dietitian for more information. Summary  Choosing the right foods helps keep your fat and cholesterol at normal levels. This can keep you from getting certain diseases.  At meals, fill one-half of your plate with vegetables and green salads.  Eat high-fiber foods, like whole grains, beans, apples, carrots, peas, and barley.  Limit added sugar, saturated fats, alcohol, and fried foods. This information is not intended to replace advice given to you by your health care provider. Make sure you discuss any questions you have with your health care provider. Document Released: 02/08/2012 Document Revised: 04/12/2018 Document Reviewed: 04/26/2017 Elsevier Patient Education  2020 Elsevier Inc.  

## 2019-05-03 LAB — LIPID PANEL W/O CHOL/HDL RATIO
Cholesterol, Total: 288 mg/dL — ABNORMAL HIGH (ref 100–199)
HDL: 54 mg/dL (ref >39)
LDL Chol Calc (NIH): 188 mg/dL — ABNORMAL HIGH (ref 0–99)
Triglycerides: 239 mg/dL — ABNORMAL HIGH (ref 0–149)
VLDL Cholesterol Cal: 46 mg/dL — ABNORMAL HIGH (ref 5–40)

## 2019-05-03 LAB — COMPREHENSIVE METABOLIC PANEL
ALT: 33 IU/L (ref 0–44)
AST: 17 IU/L (ref 0–40)
Albumin/Globulin Ratio: 2.4 — ABNORMAL HIGH (ref 1.2–2.2)
Albumin: 4.4 g/dL (ref 3.8–4.9)
Alkaline Phosphatase: 74 IU/L (ref 39–117)
BUN/Creatinine Ratio: 23 — ABNORMAL HIGH (ref 9–20)
BUN: 21 mg/dL (ref 6–24)
Bilirubin Total: 0.8 mg/dL (ref 0.0–1.2)
CO2: 27 mmol/L (ref 20–29)
Calcium: 9.2 mg/dL (ref 8.7–10.2)
Chloride: 99 mmol/L (ref 96–106)
Creatinine, Ser: 0.91 mg/dL (ref 0.76–1.27)
GFR calc Af Amer: 109 mL/min/{1.73_m2} (ref >59)
GFR calc non Af Amer: 94 mL/min/{1.73_m2} (ref >59)
Globulin, Total: 1.8 g/dL (ref 1.5–4.5)
Glucose: 176 mg/dL — ABNORMAL HIGH (ref 65–99)
Potassium: 4.5 mmol/L (ref 3.5–5.2)
Sodium: 140 mmol/L (ref 134–144)
Total Protein: 6.2 g/dL (ref 6.0–8.5)

## 2019-05-11 ENCOUNTER — Telehealth: Payer: Self-pay | Admitting: *Deleted

## 2019-05-11 DIAGNOSIS — Z122 Encounter for screening for malignant neoplasm of respiratory organs: Secondary | ICD-10-CM

## 2019-05-11 DIAGNOSIS — Z87891 Personal history of nicotine dependence: Secondary | ICD-10-CM

## 2019-05-11 NOTE — Telephone Encounter (Signed)
Patient has been notified that lung cancer screening CT scan is due currently or will be in near future. Confirmed that patient is within the appropriate age range, and asymptomatic, (no signs or symptoms of lung cancer). Patient denies illness that would prevent curative treatment for lung cancer if found. Verified smoking history (Former Smoker). Patient is agreeable for CT scan being scheduled. He prefers an early am appointment on a Monday  Or Tuesday.

## 2019-05-14 NOTE — Addendum Note (Signed)
Addended by: Lieutenant Diego on: 05/14/2019 12:36 PM   Modules accepted: Orders

## 2019-05-14 NOTE — Telephone Encounter (Signed)
Former smoker, quit 2015, 70 pack year

## 2019-05-15 ENCOUNTER — Ambulatory Visit: Payer: BC Managed Care – PPO

## 2019-05-28 ENCOUNTER — Telehealth: Payer: Self-pay | Admitting: *Deleted

## 2019-05-28 DIAGNOSIS — Z87891 Personal history of nicotine dependence: Secondary | ICD-10-CM

## 2019-05-28 DIAGNOSIS — Z122 Encounter for screening for malignant neoplasm of respiratory organs: Secondary | ICD-10-CM

## 2019-05-28 NOTE — Telephone Encounter (Signed)
Patient has been notified that annual lung cancer screening low dose CT scan is due currently or will be in near future. Confirmed that patient is within the age range of 55-77, and asymptomatic, (no signs or symptoms of lung cancer). Patient denies illness that would prevent curative treatment for lung cancer if found. Verified smoking history, (former, quit 2015, 72 pack year). The shared decision making visit was done 05/12/18. Patient is agreeable for CT scan being scheduled.

## 2019-05-29 ENCOUNTER — Ambulatory Visit
Admission: RE | Admit: 2019-05-29 | Discharge: 2019-05-29 | Disposition: A | Discharge status: Home / Self Care | Payer: BC Managed Care – PPO | Source: Ambulatory Visit | Attending: Nurse Practitioner | Admitting: Nurse Practitioner

## 2019-05-29 ENCOUNTER — Other Ambulatory Visit: Payer: Self-pay

## 2019-05-29 DIAGNOSIS — Z87891 Personal history of nicotine dependence: Secondary | ICD-10-CM

## 2019-05-29 DIAGNOSIS — Z122 Encounter for screening for malignant neoplasm of respiratory organs: Secondary | ICD-10-CM | POA: Diagnosis not present

## 2019-05-31 ENCOUNTER — Encounter: Payer: Self-pay | Admitting: Nurse Practitioner

## 2019-05-31 ENCOUNTER — Encounter: Payer: Self-pay | Admitting: *Deleted

## 2019-05-31 DIAGNOSIS — L723 Sebaceous cyst: Secondary | ICD-10-CM | POA: Insufficient documentation

## 2019-05-31 DIAGNOSIS — I2510 Atherosclerotic heart disease of native coronary artery without angina pectoris: Secondary | ICD-10-CM | POA: Insufficient documentation

## 2019-10-15 ENCOUNTER — Encounter (HOSPITAL_COMMUNITY): Payer: Self-pay | Admitting: Dentistry

## 2019-10-15 ENCOUNTER — Other Ambulatory Visit: Payer: Self-pay | Admitting: Nurse Practitioner

## 2019-10-15 NOTE — Telephone Encounter (Signed)
Requested Prescriptions  Pending Prescriptions Disp Refills  . budesonide-formoterol (SYMBICORT) 160-4.5 MCG/ACT inhaler 10.2 g 0    Sig: Inhale 2 puffs into the lungs 2 (two) times daily.     Pulmonology:  Combination Products Passed - 10/15/2019  1:14 PM      Passed - Valid encounter within last 12 months    Recent Outpatient Visits          5 months ago Chronic obstructive pulmonary disease, unspecified COPD type ([place])   [place], Henrine Screws T, NP   1 year ago Chronic obstructive pulmonary disease, unspecified COPD type [facility])   [facility] Volney American, [place]   2 years ago Strain of neck muscle, initial encounter   [facility] Volney American, [place]   2 years ago Tendinitis of right shoulder   [place], NP   3 years ago Routine general medical examination at a health care facility   [place], NP

## 2019-11-12 ENCOUNTER — Other Ambulatory Visit: Payer: Self-pay | Admitting: Nurse Practitioner

## 2019-12-10 ENCOUNTER — Other Ambulatory Visit: Payer: Self-pay | Admitting: Nurse Practitioner

## 2020-01-11 ENCOUNTER — Other Ambulatory Visit: Payer: Self-pay | Admitting: Nurse Practitioner

## 2020-01-14 ENCOUNTER — Other Ambulatory Visit: Payer: Self-pay

## 2020-01-14 ENCOUNTER — Ambulatory Visit (HOSPITAL_COMMUNITY): Payer: Self-pay | Admitting: Dentistry

## 2020-01-14 ENCOUNTER — Encounter (HOSPITAL_COMMUNITY): Payer: Self-pay | Admitting: Dentistry

## 2020-01-14 VITALS — BP 150/68 | HR 66 | Temp 98.3°F

## 2020-01-14 DIAGNOSIS — K0820 Unspecified atrophy of edentulous alveolar ridge: Secondary | ICD-10-CM

## 2020-01-14 DIAGNOSIS — K08109 Complete loss of teeth, unspecified cause, unspecified class: Secondary | ICD-10-CM

## 2020-01-14 DIAGNOSIS — D1430 Benign neoplasm of unspecified bronchus and lung: Secondary | ICD-10-CM | POA: Diagnosis not present

## 2020-01-14 DIAGNOSIS — Z463 Encounter for fitting and adjustment of dental prosthetic device: Secondary | ICD-10-CM

## 2020-01-14 DIAGNOSIS — D3A090 Benign carcinoid tumor of the bronchus and lung: Secondary | ICD-10-CM

## 2020-01-14 NOTE — Progress Notes (Signed)
01/14/2020  Patient Name:   Timothy Benitez Date of Birth:   July 08, 1961 Medical Record Number: [id number]  BP (!) 150/68 (BP Location: Right Arm)   Pulse 66   Temp 98.3 F (36.8 C)   Timothy Benitez is a 59 year-old male that presents for periodic oral examination and evaluation of upper and lower complete dentures. The patient has a history of carcinoid tumor of the lung. Patient was seen for evaluation of poor dentition in June 2016. Patient underwent extraction of remaining teeth with alveoloplasty on 01/29/2015. Upper and lower complete dentures were fabricated and inserted on 04/24/2015. Patient was seen for multiple denture adjustment appointments. Patient now presents for his annual examination and evaluation of the upper and lower complete dentures. The patient last seen for denture recall on October 16, 2018.  Patient has been seen recently by nurse practitioner in the thoracic program.  Patient Active Problem List   Diagnosis Date Noted  . Carcinoid tumor of lung 01/22/2015    Priority: Medium  . Coronary atherosclerosis 05/31/2019  . Sebaceous cyst 05/31/2019  . Hyperlipidemia 10/07/2015  . COPD (chronic obstructive pulmonary disease) ([place]) 01/02/2015    Medical Hx Update:  Past Medical History:  Diagnosis Date  . COPD (chronic obstructive pulmonary disease) ([facility])    Symbicort daily and Albuterol prn  . History of bronchitis 6 months ago  . Hyperlipidemia    takes Atorvastatin daily  . Joint pain   . Lung cancer ([place])   . Sleep apnea    "mild" never needed CPAP  .  Past Surgical History:  Procedure Laterality Date  . BRONCHOSCOPY    . ENDOBRONCHIAL ULTRASOUND N/A 01/02/2015   Procedure: ENDOBRONCHIAL ULTRASOUND;  Surgeon: Timothy Lipps, MD;  Location: [facility];  Service: Cardiopulmonary;  Laterality: N/A;  . MULTIPLE EXTRACTIONS WITH ALVEOLOPLASTY N/A 01/29/2015   Procedure: Extraction of tooth #'s 2,3,4,5,6,7,8,9,10,11,12,13,14,15,18,20,21,22,23, 25,26,27,28,29,30 with  alveoloplasty and mandibular right lateral exostoses reductions;  Surgeon: Timothy Benitez, DDS;  Location: [place];  Service: Oral Surgery;  Laterality: N/A;  . THORACOTOMY/LOBECTOMY Left 02/17/2015   WITH LEFT UPPER LOBECTOMY WITH LYMPH NODE DISSECTION, OPEN CLOSURE OF LEFT UPPER LOBE BRONCHUS, INTERCOSTAL MUSCLE FLAP COVERAGE OF LEFT UPPER LOBE BRONCHIAL STUMP, PLACEMENT OF ON-Q (Left)  . TONSILLECTOMY  1969  . VASECTOMY  2001  . VIDEO ASSISTED THORACOSCOPY (VATS)/ LOBECTOMY Left 02/17/2015   Procedure: THORACOTOMY WITH LEFT UPPER LOBECTOMY WITH LYMPH NODE DISSECTION, OPEN CLOSURE OF LEFT UPPER LOBE BRONCHUS, INTERCOSTAL MUSCLE FLAP COVERAGE OF LEFT UPPER LOBE BRONCHIAL STUMP, PLACEMENT OF ON-Q;  Surgeon: Timothy Isaac, MD;  Location: [place];  Service: Thoracic;  Laterality: Left;  Marland Kitchen VIDEO BRONCHOSCOPY  02/17/2015  . VIDEO BRONCHOSCOPY N/A 02/17/2015   Procedure: VIDEO BRONCHOSCOPY;  Surgeon: Timothy Isaac, MD;  Location: [facility] OR;  Service: Thoracic;  Laterality: N/A;  . WISDOM TOOTH EXTRACTION      ALLERGIES/ADVERSE DRUG REACTIONS: No Known Allergies  MEDICATIONS: Current Outpatient Medications  Medication Sig Dispense Refill  . albuterol (PROVENTIL HFA;VENTOLIN HFA) 108 (90 Base) MCG/ACT inhaler Inhale 1-2 puffs into the lungs every 6 (six) hours as needed for wheezing or shortness of breath. (Patient not taking: Reported on 05/02/2019) 1 Inhaler 2  . atorvastatin (LIPITOR) 20 MG tablet Take 1 tablet (20 mg total) by mouth daily at 6 PM. 90 tablet 3  . budesonide-formoterol (SYMBICORT) 160-4.5 MCG/ACT inhaler Inhale 2 puffs into the lungs 2 (two) times daily. 10.2 g 0  . Cholecalciferol (VITAMIN D) 2000 UNITS CAPS  Take 1 capsule by mouth daily.    . Magnesium 200 MG TABS Take 1 tablet by mouth daily.    . Potassium 95 MG TABS Take 95 mg by mouth daily.     No current facility-administered medications for this visit.    C/C: Patient denies any problems with his dentures.   HPI:    Patient was seen for evaluation of poor dentition in June of 2016. Patient underwent extraction of remaining teeth with alveoloplasty on 01/29/2015. Upper and lower complete dentures were fabricated and inserted on 04/24/2015. Patient was seen for multiple denture adjustment appointments. Patient now presents for his annual examination and evaluation of the upper and lower complete dentures.   DENTAL EXAM: General: Patient is a well-developed, well-nourished male in no acute distress. Vitals: BP (!) 150/68 (BP Location: Right Arm)   Pulse 66   Temp 98.3 F (36.8 C)  Extraoral Exam: There is no palpable neck lymphadenopathy. The patient denies acute TMJ symptoms. Intraoral  Exam: Patient has normal saliva. There is no evidence of denture irritation. Dentition: Patient is edentulous. Prosthodontic: The patient has upper and lower complete dentures that are stable and retentive. Pressure indicating paste was applied to dentures and minimal adjustments were made. Dentures were polished. Occlusion:  Patient has acceptable occlusion in centric relation and protrusive strokes. No adjustments were needed today.   Assessments: 1. History of carcinoid tumor of the lung 2. Patient is edentulous 3. Patient has upper and lower complete dentures that are stable and retentive   Plan:  1.  Patient is aware that I will be retiring in the near future.  Patient is to find a primary dentist of his choice for evaluation of upper and lower complete dentures on an annual basis.  Patient agrees to be referred to Dr. Phillis Benitez in [place], [place] for his annual denture recall and evaluation for upper and lower lab reliines or fabrication of new dentures as indicated. 2. Keep dentures out if sore spots arise. Use salt water rinses as needed to aid healing.  Timothy Benitez, DDS

## 2020-01-14 NOTE — Patient Instructions (Signed)
Plan:  1.  Patient is aware that I will be retiring in the near future.  Patient is to find a primary dentist of his choice for evaluation of upper and lower complete dentures on an annual basis.  Patient agrees to be referred to Dr. Phillis Haggis in [place], [place] for his annual denture recall and evaluation for upper and lower lab reliines or fabrication of new dentures as indicated. 2. Keep dentures out if sore spots arise. Use salt water rinses as needed to aid healing.  Lenn Cal, DDS

## 2020-04-24 ENCOUNTER — Other Ambulatory Visit: Payer: Self-pay | Admitting: Nurse Practitioner

## 2020-05-05 ENCOUNTER — Other Ambulatory Visit: Payer: Self-pay | Admitting: Nurse Practitioner

## 2020-05-05 DIAGNOSIS — E7800 Pure hypercholesterolemia, unspecified: Secondary | ICD-10-CM

## 2020-05-27 ENCOUNTER — Other Ambulatory Visit: Payer: Self-pay | Admitting: *Deleted

## 2020-05-27 DIAGNOSIS — Z122 Encounter for screening for malignant neoplasm of respiratory organs: Secondary | ICD-10-CM

## 2020-05-27 DIAGNOSIS — Z87891 Personal history of nicotine dependence: Secondary | ICD-10-CM

## 2020-05-27 NOTE — Progress Notes (Signed)
Former smoker, quit 2015, 70 pack year history.

## 2020-06-02 ENCOUNTER — Ambulatory Visit
Admission: RE | Admit: 2020-06-02 | Discharge: 2020-06-02 | Disposition: A | Discharge status: Home / Self Care | Payer: BC Managed Care – PPO | Source: Ambulatory Visit | Attending: Oncology | Admitting: Oncology

## 2020-06-02 ENCOUNTER — Encounter: Payer: Self-pay | Admitting: Nurse Practitioner

## 2020-06-02 ENCOUNTER — Other Ambulatory Visit: Payer: Self-pay

## 2020-06-02 ENCOUNTER — Ambulatory Visit (INDEPENDENT_AMBULATORY_CARE_PROVIDER_SITE_OTHER): Payer: BC Managed Care – PPO | Admitting: Nurse Practitioner

## 2020-06-02 VITALS — BP 135/80 | HR 75 | Temp 99.4°F | Ht 70.5 in | Wt 208.6 lb

## 2020-06-02 DIAGNOSIS — Z122 Encounter for screening for malignant neoplasm of respiratory organs: Secondary | ICD-10-CM

## 2020-06-02 DIAGNOSIS — Z0000 Encounter for general adult medical examination without abnormal findings: Secondary | ICD-10-CM

## 2020-06-02 DIAGNOSIS — R7309 Other abnormal glucose: Secondary | ICD-10-CM

## 2020-06-02 DIAGNOSIS — Z87891 Personal history of nicotine dependence: Secondary | ICD-10-CM

## 2020-06-02 DIAGNOSIS — I700 Atherosclerosis of aorta: Secondary | ICD-10-CM | POA: Insufficient documentation

## 2020-06-02 DIAGNOSIS — D171 Benign lipomatous neoplasm of skin and subcutaneous tissue of trunk: Secondary | ICD-10-CM | POA: Insufficient documentation

## 2020-06-02 NOTE — Progress Notes (Signed)
BP 135/80 (BP Location: Left Arm, Patient Position: Sitting, Cuff Size: Normal)   Pulse 75   Temp 99.4 F (37.4 C) (Oral)   Ht 5' 10.5" (1.791 m)   Wt 208 lb 9.6 oz (94.6 kg)   SpO2 97%   BMI 29.51 kg/m    Subjective:    Patient ID: Timothy Benitez, male    DOB: 20-Oct-1960, 59 y.o.   MRN: [id number]  HPI: Timothy Benitez is a 59 y.o. male presenting on 06/02/2020 for comprehensive medical examination. Current medical complaints include:none  He currently lives with: significant other Interim Problems from his last visit: no  Functional Status Survey: Is the patient deaf or have difficulty hearing?: No Does the patient have difficulty seeing, even when wearing glasses/contacts?: No Does the patient have difficulty concentrating, remembering, or making decisions?: No Does the patient have difficulty walking or climbing stairs?: No Does the patient have difficulty dressing or bathing?: No Does the patient have difficulty doing errands alone such as visiting a doctor's office or shopping?: No  FALL RISK: Fall Risk  06/02/2020 10/11/2016  Falls in the past year? 0 No  Number falls in past yr: 0 -  Injury with Fall? 0 -  Risk for fall due to : No Fall Risks -  Follow up Falls evaluation completed -    Depression Screen Depression screen [facility] 2/9 06/02/2020 03/16/2018 10/11/2016  Decreased Interest 0 0 0  Down, Depressed, Hopeless 0 0 0  PHQ - 2 Score 0 0 0  Altered sleeping - 1 -  Tired, decreased energy - 1 -  Change in appetite - 0 -  Feeling bad or failure about yourself  - 0 -  Trouble concentrating - 0 -  Moving slowly or fidgety/restless - 0 -  PHQ-9 Score - 2 -    Advanced Directives <no information>  Past Medical History:  Past Medical History:  Diagnosis Date  . COPD (chronic obstructive pulmonary disease) (HCC)    Symbicort daily and Albuterol prn  . History of bronchitis 6 months ago  . Hyperlipidemia    takes Atorvastatin daily  . Joint pain   . Lung  cancer ([place])   . Sleep apnea    "mild" never needed CPAP    Surgical History:  Past Surgical History:  Procedure Laterality Date  . BRONCHOSCOPY    . ENDOBRONCHIAL ULTRASOUND N/A 01/02/2015   Procedure: ENDOBRONCHIAL ULTRASOUND;  Surgeon: Flora Lipps, MD;  Location: [facility];  Service: Cardiopulmonary;  Laterality: N/A;  . MULTIPLE EXTRACTIONS WITH ALVEOLOPLASTY N/A 01/29/2015   Procedure: Extraction of tooth #'s 2,3,4,5,6,7,8,9,10,11,12,13,14,15,18,20,21,22,23, 25,26,27,28,29,30 with alveoloplasty and mandibular right lateral exostoses reductions;  Surgeon: Lenn Cal, DDS;  Location: [place];  Service: Oral Surgery;  Laterality: N/A;  . THORACOTOMY/LOBECTOMY Left 02/17/2015   WITH LEFT UPPER LOBECTOMY WITH LYMPH NODE DISSECTION, OPEN CLOSURE OF LEFT UPPER LOBE BRONCHUS, INTERCOSTAL MUSCLE FLAP COVERAGE OF LEFT UPPER LOBE BRONCHIAL STUMP, PLACEMENT OF ON-Q (Left)  . TONSILLECTOMY  1969  . VASECTOMY  2001  . VIDEO ASSISTED THORACOSCOPY (VATS)/ LOBECTOMY Left 02/17/2015   Procedure: THORACOTOMY WITH LEFT UPPER LOBECTOMY WITH LYMPH NODE DISSECTION, OPEN CLOSURE OF LEFT UPPER LOBE BRONCHUS, INTERCOSTAL MUSCLE FLAP COVERAGE OF LEFT UPPER LOBE BRONCHIAL STUMP, PLACEMENT OF ON-Q;  Surgeon: [name], MD;  Location: [place];  Service: Thoracic;  Laterality: Left;  Marland Kitchen VIDEO BRONCHOSCOPY  02/17/2015  . VIDEO BRONCHOSCOPY N/A 02/17/2015   Procedure: VIDEO BRONCHOSCOPY;  Surgeon: [name], MD;  [facility];  Service: Thoracic;  Laterality: N/A;  . WISDOM TOOTH EXTRACTION      Medications:  Current Outpatient Medications on File Prior to Visit  Medication Sig  . atorvastatin (LIPITOR) 20 MG tablet Take 1 tablet (20 mg total) by mouth daily at 6 PM.  . budesonide-formoterol (SYMBICORT) 160-4.5 MCG/ACT inhaler Inhale 2 puffs into the lungs 2 (two) times daily.  . Cholecalciferol (VITAMIN D) 2000 UNITS CAPS Take 1 capsule by mouth daily.  . Magnesium 200 MG TABS Take 1 tablet by mouth  daily.  . Potassium 95 MG TABS Take 95 mg by mouth daily.  Marland Kitchen albuterol (PROVENTIL HFA;VENTOLIN HFA) 108 (90 Base) MCG/ACT inhaler Inhale 1-2 puffs into the lungs every 6 (six) hours as needed for wheezing or shortness of breath. (Patient not taking: Reported on 05/02/2019)   No current facility-administered medications on file prior to visit.    Allergies:  No Known Allergies  Social History:  Social History   Socioeconomic History  . Marital status: Divorced    Spouse name: Not on file  . Number of children: 5  . Years of education: Not on file  . Highest education level: Not on file  Occupational History  . Not on file  Tobacco Use  . Smoking status: Former Smoker    Packs/day: 2.00    Years: 35.00    Pack years: 70.00    Types: Cigarettes    Quit date: 09/27/2013    Years since quitting: 6.6  . Smokeless tobacco: Never Used  . Tobacco comment: quit smoking in 2015(cig) and cigars(01/29/15)  Vaping Use  . Vaping Use: Never used  Substance and Sexual Activity  . Alcohol use: Yes    Alcohol/week: 1.0 standard drink    Types: 1 Cans of beer per week    Comment: occasionally  . Drug use: No  . Sexual activity: Not Currently  Other Topics Concern  . Not on file  Social History Narrative  . Not on file   Social Determinants of Health   Financial Resource Strain:   . Difficulty of Paying Living Expenses: Not on file  Food Insecurity:   . Worried About Charity fundraiser in the Last Year: Not on file  . Ran Out of Food in the Last Year: Not on file  Transportation Needs:   . Lack of Transportation (Medical): Not on file  . Lack of Transportation (Non-Medical): Not on file  Physical Activity:   . Days of Exercise per Week: Not on file  . Minutes of Exercise per Session: Not on file  Stress:   . Feeling of Stress : Not on file  Social Connections:   . Frequency of Communication with Friends and Family: Not on file  . Frequency of Social Gatherings with Friends and  Family: Not on file  . Attends Religious Services: Not on file  . Active Member of Clubs or Organizations: Not on file  . Attends Archivist Meetings: Not on file  . Marital Status: Not on file  Intimate Partner Violence:   . Fear of Current or Ex-Partner: Not on file  . Emotionally Abused: Not on file  . Physically Abused: Not on file  . Sexually Abused: Not on file   Social History   Tobacco Use  Smoking Status Former Smoker  . Packs/day: 2.00  . Years: 35.00  . Pack years: 70.00  . Types: Cigarettes  . Quit date: 09/27/2013  . Years since quitting: 6.6  Smokeless Tobacco  Never Used  Tobacco Comment   quit smoking in 2015(cig) and cigars(01/29/15)   Social History   Substance and Sexual Activity  Alcohol Use Yes  . Alcohol/week: 1.0 standard drink  . Types: 1 Cans of beer per week   Comment: occasionally    Family History:  Family History  Problem Relation Age of Onset  . Leukemia Father   . Alzheimer's disease Mother   . Alcohol abuse Maternal Grandmother   . Heart attack Maternal Grandfather   . Alcohol abuse Paternal Grandfather     Past medical history, surgical history, medications, allergies, family history and social history reviewed with patient today and changes made to appropriate areas of the chart.   Review of Systems - negative All other ROS negative except what is listed above and in the HPI.      Objective:    BP 135/80 (BP Location: Left Arm, Patient Position: Sitting, Cuff Size: Normal)   Pulse 75   Temp 99.4 F (37.4 C) (Oral)   Ht 5' 10.5" (1.791 m)   Wt 208 lb 9.6 oz (94.6 kg)   SpO2 97%   BMI 29.51 kg/m   Wt Readings from Last 3 Encounters:  06/02/20 208 lb 9.6 oz (94.6 kg)  05/29/19 210 lb (95.3 kg)  05/02/19 210 lb (95.3 kg)    Physical Exam Vitals and nursing note reviewed.  Constitutional:      General: He is awake. He is not in acute distress.    Appearance: He is well-developed and well-groomed. He is not  ill-appearing.  HENT:     Head: Normocephalic and atraumatic.     Right Ear: Hearing, tympanic membrane, ear canal and external ear normal. No drainage.     Left Ear: Hearing, tympanic membrane, ear canal and external ear normal. No drainage.     Nose: Nose normal.     Mouth/Throat:     Pharynx: Uvula midline.  Eyes:     General: Lids are normal.        Right eye: No discharge.        Left eye: No discharge.     Extraocular Movements: Extraocular movements intact.     Conjunctiva/sclera: Conjunctivae normal.     Pupils: Pupils are equal, round, and reactive to light.     Visual Fields: Right eye visual fields normal and left eye visual fields normal.  Neck:     Thyroid: No thyromegaly.     Vascular: No carotid bruit or JVD.     Trachea: Trachea normal.  Cardiovascular:     Rate and Rhythm: Normal rate and regular rhythm.     Heart sounds: Normal heart sounds, S1 normal and S2 normal. No murmur heard.  No gallop.   Pulmonary:     Effort: Pulmonary effort is normal. No accessory muscle usage or respiratory distress.     Breath sounds: Normal breath sounds.  Abdominal:     General: Bowel sounds are normal.     Palpations: Abdomen is soft. There is no hepatomegaly or splenomegaly.     Tenderness: There is no abdominal tenderness.  Musculoskeletal:        General: Normal range of motion.     Cervical back: Normal range of motion and neck supple.     Right lower leg: No edema.     Left lower leg: No edema.  Lymphadenopathy:     Head:     Right side of head: No submental, submandibular, tonsillar, preauricular or posterior auricular adenopathy.  Left side of head: No submental, submandibular, tonsillar, preauricular or posterior auricular adenopathy.     Cervical: No cervical adenopathy.  Skin:    General: Skin is warm and dry.     Capillary Refill: Capillary refill takes less than 2 seconds.     Findings: No rash.       Neurological:     Mental Status: He is alert and  oriented to person, place, and time.     Cranial Nerves: Cranial nerves are intact.     Gait: Gait is intact.     Deep Tendon Reflexes: Reflexes are normal and symmetric.     Reflex Scores:      Brachioradialis reflexes are 2+ on the right side and 2+ on the left side.      Patellar reflexes are 2+ on the right side and 2+ on the left side. Psychiatric:        Attention and Perception: Attention normal.        Mood and Affect: Mood normal.        Speech: Speech normal.        Behavior: Behavior normal. Behavior is cooperative.        Thought Content: Thought content normal.        Cognition and Memory: Cognition normal.        Judgment: Judgment normal.      Results for orders placed or performed in visit on 05/02/19  Comprehensive metabolic panel  Result Value Ref Range   Glucose 176 (H) 65 - 99 mg/dL   BUN 21 6 - 24 mg/dL   Creatinine, Ser 0.91 0.76 - 1.27 mg/dL   GFR calc non Af Amer 94 >59 mL/min/1.73   GFR calc Af Amer 109 >59 mL/min/1.73   BUN/Creatinine Ratio 23 (H) 9 - 20   Sodium 140 134 - 144 mmol/L   Potassium 4.5 3.5 - 5.2 mmol/L   Chloride 99 96 - 106 mmol/L   CO2 27 20 - 29 mmol/L   Calcium 9.2 8.7 - 10.2 mg/dL   Total Protein 6.2 6.0 - 8.5 g/dL   Albumin 4.4 3.8 - 4.9 g/dL   Globulin, Total 1.8 1.5 - 4.5 g/dL   Albumin/Globulin Ratio 2.4 (H) 1.2 - 2.2   Bilirubin Total 0.8 0.0 - 1.2 mg/dL   Alkaline Phosphatase 74 39 - 117 IU/L   AST 17 0 - 40 IU/L   ALT 33 0 - 44 IU/L  Lipid Panel w/o Chol/HDL Ratio  Result Value Ref Range   Cholesterol, Total 288 (H) 100 - 199 mg/dL   Triglycerides 239 (H) 0 - 149 mg/dL   HDL 54 >39 mg/dL   VLDL Cholesterol Cal 46 (H) 5 - 40 mg/dL   LDL Chol Calc (NIH) 188 (H) 0 - 99 mg/dL      Assessment & Plan:   Problem List Items Addressed This Visit    None    Visit Diagnoses    Routine physical examination    -  Primary   Annual labs ordered today to include CBC, CMP, TSH, PSA.  No acute issues.  Refuses colon CA  screening or flu vaccine.   Relevant Orders   Comprehensive metabolic panel   CBC with Differential/Platelet   Lipid Panel w/o Chol/HDL Ratio   TSH   PSA      Discussed aspirin prophylaxis for myocardial infarction prevention and decision was it was not indicated  LABORATORY TESTING:  Health maintenance labs ordered today as discussed above.  The natural history of prostate cancer and ongoing controversy regarding screening and potential treatment outcomes of prostate cancer has been discussed with the patient. The meaning of a false positive PSA and a false negative PSA has been discussed. He indicates understanding of the limitations of this screening test and wishes to proceed with screening PSA testing.   IMMUNIZATIONS:   - Tdap: Tetanus vaccination status reviewed: last tetanus booster within 10 years. - Influenza: Refused - Pneumovax: Up to date in 2016 - Prevnar: Not applicable - Zostavax vaccine: Refused  SCREENING: - Colonoscopy: Refused --- discussed Cologuard and refuses this  Discussed with patient purpose of the colonoscopy is to detect colon cancer at curable precancerous or early stages   - AAA Screening: Not applicable  -Hearing Test: Not applicable  -Spirometry: Not applicable   PATIENT COUNSELING:    Sexuality: Discussed sexually transmitted diseases, partner selection, use of condoms, avoidance of unintended pregnancy  and contraceptive alternatives.   Advised to avoid cigarette smoking.  I discussed with the patient that most people either abstain from alcohol or drink within safe limits (<=14/week and <=4 drinks/occasion for males, <=7/weeks and <= 3 drinks/occasion for females) and that the risk for alcohol disorders and other health effects rises proportionally with the number of drinks per week and how often a drinker exceeds daily limits.  Discussed cessation/primary prevention of drug use and availability of treatment for abuse.   Diet: Encouraged  to adjust caloric intake to maintain  or achieve ideal body weight, to reduce intake of dietary saturated fat and total fat, to limit sodium intake by avoiding high sodium foods and not adding table salt, and to maintain adequate dietary potassium and calcium preferably from fresh fruits, vegetables, and low-fat dairy products.    Stressed the importance of regular exercise  Injury prevention: Discussed safety belts, safety helmets, smoke detector, smoking near bedding or upholstery.   Dental health: Discussed importance of regular tooth brushing, flossing, and dental visits.   Follow up plan: NEXT PREVENTATIVE PHYSICAL DUE IN 1 YEAR. Return in about 6 months (around 12/01/2020) for COPD, HLD, Lipoma -- needs spirometry.

## 2020-06-02 NOTE — Patient Instructions (Signed)
Healthy Eating Following a healthy eating pattern may help you to achieve and maintain a healthy body weight, reduce the risk of chronic disease, and live a long and productive life. It is important to follow a healthy eating pattern at an appropriate calorie level for your body. Your nutritional needs should be met primarily through food by choosing a variety of nutrient-rich foods. What are tips for following this plan? Reading food labels  Read labels and choose the following: ? Reduced or low sodium. ? Juices with 100% fruit juice. ? Foods with low saturated fats and high polyunsaturated and monounsaturated fats. ? Foods with whole grains, such as whole wheat, cracked wheat, brown rice, and wild rice. ? Whole grains that are fortified with folic acid. This is recommended for women who are pregnant or who want to become pregnant.  Read labels and avoid the following: ? Foods with a lot of added sugars. These include foods that contain brown sugar, corn sweetener, corn syrup, dextrose, fructose, glucose, high-fructose corn syrup, honey, invert sugar, lactose, malt syrup, maltose, molasses, raw sugar, sucrose, trehalose, or turbinado sugar.  Do not eat more than the following amounts of added sugar per day:  6 teaspoons (25 g) for women.  9 teaspoons (38 g) for men. ? Foods that contain processed or refined starches and grains. ? Refined grain products, such as white flour, degermed cornmeal, white bread, and white rice. Shopping  Choose nutrient-rich snacks, such as vegetables, whole fruits, and nuts. Avoid high-calorie and high-sugar snacks, such as potato chips, fruit snacks, and candy.  Use oil-based dressings and spreads on foods instead of solid fats such as butter, stick margarine, or cream cheese.  Limit pre-made sauces, mixes, and "instant" products such as flavored rice, instant noodles, and ready-made pasta.  Try more plant-protein sources, such as tofu, tempeh, black beans,  edamame, lentils, nuts, and seeds.  Explore eating plans such as the Mediterranean diet or vegetarian diet. Cooking  Use oil to saut or stir-fry foods instead of solid fats such as butter, stick margarine, or lard.  Try baking, boiling, grilling, or broiling instead of frying.  Remove the fatty part of meats before cooking.  Steam vegetables in water or broth. Meal planning   At meals, imagine dividing your plate into fourths: ? One-half of your plate is fruits and vegetables. ? One-fourth of your plate is whole grains. ? One-fourth of your plate is protein, especially lean meats, poultry, eggs, tofu, beans, or nuts.  Include low-fat dairy as part of your daily diet. Lifestyle  Choose healthy options in all settings, including home, work, school, restaurants, or stores.  Prepare your food safely: ? Wash your hands after handling raw meats. ? Keep food preparation surfaces clean by regularly washing with hot, soapy water. ? Keep raw meats separate from ready-to-eat foods, such as fruits and vegetables. ? Cook seafood, meat, poultry, and eggs to the recommended internal temperature. ? Store foods at safe temperatures. In general:  Keep cold foods at 59F (4.4C) or below.  Keep hot foods at 159F (60C) or above.  Keep your freezer at [facility] (-17.8C) or below.  Foods are no longer safe to eat when they have been between the temperatures of 40-159F (4.4-60C) for more than 2 hours. What foods should I eat? Fruits Aim to eat 2 cup-equivalents of fresh, canned (in natural juice), or frozen fruits each day. Examples of 1 cup-equivalent of fruit include 1 small apple, 8 large strawberries, 1 cup canned fruit,  cup  dried fruit, or 1 cup 100% juice. Vegetables Aim to eat 2-3 cup-equivalents of fresh and frozen vegetables each day, including different varieties and colors. Examples of 1 cup-equivalent of vegetables include 2 medium carrots, 2 cups raw, leafy greens, 1 cup chopped  vegetable (raw or cooked), or 1 medium baked potato. Grains Aim to eat 6 ounce-equivalents of whole grains each day. Examples of 1 ounce-equivalent of grains include 1 slice of bread, 1 cup ready-to-eat cereal, 3 cups popcorn, or  cup cooked rice, pasta, or cereal. Meats and other proteins Aim to eat 5-6 ounce-equivalents of protein each day. Examples of 1 ounce-equivalent of protein include 1 egg, 1/2 cup nuts or seeds, or 1 tablespoon (16 g) peanut butter. A cut of meat or fish that is the size of a deck of cards is about 3-4 ounce-equivalents.  Of the protein you eat each week, try to have at least 8 ounces come from seafood. This includes salmon, trout, herring, and anchovies. Dairy Aim to eat 3 cup-equivalents of fat-free or low-fat dairy each day. Examples of 1 cup-equivalent of dairy include 1 cup (240 mL) milk, 8 ounces (250 g) yogurt, 1 ounces (44 g) natural cheese, or 1 cup (240 mL) fortified soy milk. Fats and oils  Aim for about 5 teaspoons (21 g) per day. Choose monounsaturated fats, such as canola and olive oils, avocados, peanut butter, and most nuts, or polyunsaturated fats, such as sunflower, corn, and soybean oils, walnuts, pine nuts, sesame seeds, sunflower seeds, and flaxseed. Beverages  Aim for six 8-oz glasses of water per day. Limit coffee to three to five 8-oz cups per day.  Limit caffeinated beverages that have added calories, such as soda and energy drinks.  Limit alcohol intake to no more than 1 drink a day for nonpregnant women and 2 drinks a day for men. One drink equals 12 oz of beer (355 mL), 5 oz of wine (148 mL), or 1 oz of hard liquor (44 mL). Seasoning and other foods  Avoid adding excess amounts of salt to your foods. Try flavoring foods with herbs and spices instead of salt.  Avoid adding sugar to foods.  Try using oil-based dressings, sauces, and spreads instead of solid fats. This information is based on general [place] nutrition guidelines. For more  information, visit [employer]. Exact amounts may vary based on your nutrition needs. Summary  A healthy eating plan may help you to maintain a healthy weight, reduce the risk of chronic diseases, and stay active throughout your life.  Plan your meals. Make sure you eat the right portions of a variety of nutrient-rich foods.  Try baking, boiling, grilling, or broiling instead of frying.  Choose healthy options in all settings, including home, work, school, restaurants, or stores. This information is not intended to replace advice given to you by your health care provider. Make sure you discuss any questions you have with your health care provider. Document Revised: 11/21/2017 Document Reviewed: 11/21/2017 Elsevier Patient Education  [place].

## 2020-06-03 LAB — COMPREHENSIVE METABOLIC PANEL
ALT: 39 IU/L (ref 0–44)
AST: 18 IU/L (ref 0–40)
Albumin/Globulin Ratio: 2.4 — ABNORMAL HIGH (ref 1.2–2.2)
Albumin: 4.3 g/dL (ref 3.8–4.9)
Alkaline Phosphatase: 84 IU/L (ref 44–121)
BUN/Creatinine Ratio: 18 (ref 9–20)
BUN: 15 mg/dL (ref 6–24)
Bilirubin Total: 0.8 mg/dL (ref 0.0–1.2)
CO2: 25 mmol/L (ref 20–29)
Calcium: 9.1 mg/dL (ref 8.7–10.2)
Chloride: 101 mmol/L (ref 96–106)
Creatinine, Ser: 0.85 mg/dL (ref 0.76–1.27)
GFR calc Af Amer: 112 mL/min/{1.73_m2} (ref >59)
GFR calc non Af Amer: 97 mL/min/{1.73_m2} (ref >59)
Globulin, Total: 1.8 g/dL (ref 1.5–4.5)
Glucose: 156 mg/dL — ABNORMAL HIGH (ref 65–99)
Potassium: 4.5 mmol/L (ref 3.5–5.2)
Sodium: 139 mmol/L (ref 134–144)
Total Protein: 6.1 g/dL (ref 6.0–8.5)

## 2020-06-03 LAB — CBC WITH DIFFERENTIAL/PLATELET
Basophils Absolute: 0 10*3/uL (ref 0.0–0.2)
Basos: 1 %
EOS (ABSOLUTE): 0.1 10*3/uL (ref 0.0–0.4)
Eos: 2 %
Hematocrit: 44.9 % (ref 37.5–51.0)
Hemoglobin: 14.8 g/dL (ref 13.0–17.7)
Immature Grans (Abs): 0 10*3/uL (ref 0.0–0.1)
Immature Granulocytes: 1 %
Lymphocytes Absolute: 1.5 10*3/uL (ref 0.7–3.1)
Lymphs: 24 %
MCH: 28.1 pg (ref 26.6–33.0)
MCHC: 33 g/dL (ref 31.5–35.7)
MCV: 85 fL (ref 79–97)
Monocytes Absolute: 0.6 10*3/uL (ref 0.1–0.9)
Monocytes: 9 %
Neutrophils Absolute: 4 10*3/uL (ref 1.4–7.0)
Neutrophils: 63 %
Platelets: 204 10*3/uL (ref 150–450)
RBC: 5.27 x10E6/uL (ref 4.14–5.80)
RDW: 12.3 % (ref 11.6–15.4)
WBC: 6.3 10*3/uL (ref 3.4–10.8)

## 2020-06-03 LAB — LIPID PANEL W/O CHOL/HDL RATIO
Cholesterol, Total: 186 mg/dL (ref 100–199)
HDL: 49 mg/dL (ref >39)
LDL Chol Calc (NIH): 110 mg/dL — ABNORMAL HIGH (ref 0–99)
Triglycerides: 154 mg/dL — ABNORMAL HIGH (ref 0–149)
VLDL Cholesterol Cal: 27 mg/dL (ref 5–40)

## 2020-06-03 LAB — TSH: TSH: 1.28 u[IU]/mL (ref 0.450–4.500)

## 2020-06-03 LAB — PSA: Prostate Specific Ag, Serum: 0.9 ng/mL (ref 0.0–4.0)

## 2020-06-03 NOTE — Addendum Note (Signed)
Addended by: Marnee Guarneri T on: 06/03/2020 12:50 PM   Modules accepted: Orders

## 2020-06-03 NOTE — Progress Notes (Signed)
Contacted via MyChart The 10-year ASCVD risk score Mikey Bussing DC Jr., et al., 2013) is: 6.9%   Values used to calculate the score:     Age: 59 years     Sex: Male     Is Non-Hispanic African American: No     Diabetic: No     Tobacco smoker: No     Systolic Blood Pressure: 158 mmHg     Is BP treated: No     HDL Cholesterol: 49 mg/dL     Total Cholesterol: 186 mg/dL  Good afternoon Timothy Benitez, your labs have returned.  Your sugar levels continue to be quite elevated, which they have been since 2017 on review.  I have added an A1C to your labs, that they will hopefully be able to get off your labs yesterday, to check for diabetes.  I am concerned this may be present.  IF they are unable to get off of your labs from yesterday I will let you know and have you come in for outpatient lab to check.  Remainder of labs look good with exception of cholesterol levels which continue to be elevated, continue Atorvastatin -- if ongoing elevations we may increase dose.  Any questions? Keep being awesome!!  Thank you for allowing me to participate in your care. Kindest regards, Timothy Benitez

## 2020-06-05 ENCOUNTER — Encounter: Payer: Self-pay | Admitting: *Deleted

## 2020-06-05 ENCOUNTER — Other Ambulatory Visit: Payer: Self-pay | Admitting: Nurse Practitioner

## 2020-06-05 ENCOUNTER — Encounter: Payer: Self-pay | Admitting: Nurse Practitioner

## 2020-06-05 DIAGNOSIS — E1159 Type 2 diabetes mellitus with other circulatory complications: Secondary | ICD-10-CM | POA: Insufficient documentation

## 2020-06-05 LAB — HEMOGLOBIN A1C
Est. average glucose Bld gHb Est-mCnc: 166 mg/dL
Hgb A1c MFr Bld: 7.4 % — ABNORMAL HIGH (ref 4.8–5.6)

## 2020-06-05 LAB — SPECIMEN STATUS REPORT

## 2020-06-05 MED ORDER — COMFORT LANCETS MISC: 12 refills | Status: AC

## 2020-06-05 MED ORDER — METFORMIN HCL 500 MG PO TABS: 500.0000 mg | ORAL_TABLET | Freq: Two times a day (BID) | ORAL | 3 refills | Status: DC

## 2020-06-05 MED ORDER — GLUCOSE BLOOD VI STRP: ORAL_STRIP | 12 refills | Status: AC

## 2020-06-05 MED ORDER — CONTOUR NEXT MONITOR W/DEVICE KIT: PACK | 0 refills | Status: AC

## 2020-06-05 NOTE — Progress Notes (Signed)
Contacted via [place] [place] -- attempted to call but mailbox is full, please attempt to call later with below message as I am concerned he will not see MyChart message and we need to make changes: Good afternoon Timothy Benitez, your diabetes testing returned and is showing diabetes is present.  Your A1C is 7.4%, any A1C 6.5% or greater is considered diabetes.  We will need to start some Metformin to help control this.  We can start out with 500 MG by mouth once a day for one week and then increase to 500 MG twice a day.  I will send this script in and will need you to scheduled follow-up in 6 weeks to see how tolerating, A1C we will recheck in January.  I am also going to send in glucometer covered by your insurance and supplies, I want you to check sugar every morning before you eat + document and bring to visit -- goal is sugar less than 130.  Any questions?  We will work on control of the diabetes together. Keep being awesome!!  Thank you for allowing me to participate in your care. Kindest regards, Danner Paulding

## 2020-06-06 ENCOUNTER — Encounter: Payer: Self-pay | Admitting: Nurse Practitioner

## 2020-06-09 ENCOUNTER — Telehealth: Payer: Self-pay

## 2020-06-09 NOTE — Telephone Encounter (Signed)
Please call and move patients April appointment to January or February per Jolene.

## 2020-06-09 NOTE — Telephone Encounter (Signed)
Noted, thank you [place].  Would like to see him back in 3 to 4 months then to recheck A1C prior to April.

## 2020-06-09 NOTE — Telephone Encounter (Signed)
-----   Message from Venita Lick, NP sent at 06/08/2020 10:03 AM EDT ----- Contacted via MyChart -- attempted to call but mailbox is full, please attempt to call later with below message as I am concerned he will not see MyChart message and we need to make changes: Good afternoon Timothy Benitez, your diabetes testing returned and is showing diabetes is present. Your A1C is 7.4%, any A1C 6.5% or greater is considered diabetes. We will need to start some Metformin to help control this. We can start out with 500 MG by mouth once a day for one week and then increase to 500 MG twice a day. I will send this script in and will need you to scheduled follow-up in 6 weeks to see how tolerating, A1C we will recheck in January. I am also going to send in glucometer covered by your insurance and supplies, I want you to check sugar every morning before you eat + document and bring to visit -- goal is sugar less than 130. Any questions? We will work on control of the diabetes together. Keep being awesome!! Thank you for allowing me to participate in your care. Kindest regards, Jolene

## 2020-06-09 NOTE — Telephone Encounter (Signed)
Called and spoke with patient. He states that he did read the mychart message regarding diabetes. Patient states that he is not going to start Metformin right now and would like to work on his diet and exercise until his next appointment.

## 2020-06-11 ENCOUNTER — Other Ambulatory Visit: Payer: Self-pay

## 2020-06-11 MED ORDER — BUDESONIDE-FORMOTEROL FUMARATE 160-4.5 MCG/ACT IN AERO
INHALATION_SPRAY | RESPIRATORY_TRACT | 4 refills | Status: DC
Start: 2020-06-11 — End: 2020-11-21

## 2020-08-08 ENCOUNTER — Other Ambulatory Visit: Payer: Self-pay | Admitting: Nurse Practitioner

## 2020-08-08 DIAGNOSIS — E7800 Pure hypercholesterolemia, unspecified: Secondary | ICD-10-CM

## 2020-11-12 ENCOUNTER — Other Ambulatory Visit: Payer: Self-pay | Admitting: Nurse Practitioner

## 2020-11-12 DIAGNOSIS — E7800 Pure hypercholesterolemia, unspecified: Secondary | ICD-10-CM

## 2020-11-21 ENCOUNTER — Other Ambulatory Visit: Payer: Self-pay | Admitting: Nurse Practitioner

## 2020-12-01 ENCOUNTER — Other Ambulatory Visit: Payer: Self-pay

## 2020-12-01 ENCOUNTER — Ambulatory Visit: Payer: BC Managed Care – PPO | Admitting: Nurse Practitioner

## 2020-12-01 ENCOUNTER — Encounter: Payer: Self-pay | Admitting: Nurse Practitioner

## 2020-12-01 VITALS — BP 113/70 | HR 75 | Temp 98.1°F | Wt 207.6 lb

## 2020-12-01 DIAGNOSIS — E1159 Type 2 diabetes mellitus with other circulatory complications: Secondary | ICD-10-CM | POA: Diagnosis not present

## 2020-12-01 DIAGNOSIS — J432 Centrilobular emphysema: Secondary | ICD-10-CM | POA: Diagnosis not present

## 2020-12-01 DIAGNOSIS — I700 Atherosclerosis of aorta: Secondary | ICD-10-CM

## 2020-12-01 DIAGNOSIS — E1169 Type 2 diabetes mellitus with other specified complication: Secondary | ICD-10-CM

## 2020-12-01 DIAGNOSIS — Z87891 Personal history of nicotine dependence: Secondary | ICD-10-CM

## 2020-12-01 DIAGNOSIS — D171 Benign lipomatous neoplasm of skin and subcutaneous tissue of trunk: Secondary | ICD-10-CM

## 2020-12-01 DIAGNOSIS — Z85118 Personal history of other malignant neoplasm of bronchus and lung: Secondary | ICD-10-CM

## 2020-12-01 DIAGNOSIS — E785 Hyperlipidemia, unspecified: Secondary | ICD-10-CM

## 2020-12-01 LAB — BAYER DCA HB A1C WAIVED: HB A1C (BAYER DCA - WAIVED): 7 % — ABNORMAL HIGH (ref <7.0)

## 2020-12-01 LAB — MICROALBUMIN, URINE WAIVED
Creatinine, Urine Waived: 300 mg/dL (ref 10–300)
Microalb, Ur Waived: 10 mg/L (ref 0–19)
Microalb/Creat Ratio: <30 mg/g (ref <30)

## 2020-12-01 MED ORDER — BUDESONIDE-FORMOTEROL FUMARATE 160-4.5 MCG/ACT IN AERO: INHALATION_SPRAY | RESPIRATORY_TRACT | 12 refills | Status: DC

## 2020-12-01 MED ORDER — ATORVASTATIN CALCIUM 20 MG PO TABS: 20.0000 mg | ORAL_TABLET | Freq: Every day | ORAL | 4 refills | Status: DC

## 2020-12-01 MED ORDER — METFORMIN HCL 500 MG PO TABS: ORAL_TABLET | ORAL | 4 refills | Status: DC

## 2020-12-01 NOTE — Assessment & Plan Note (Signed)
Continue complete cessation.

## 2020-12-01 NOTE — Assessment & Plan Note (Signed)
New diagnosis at last visit with A1c then 7.4%, today it is 7%.  Will increase Metformin to 1000 MG in morning and 500 MG at night, he is tolerating this medication well.  Goal A1c <7% and educated him on sugar goals = fasting <130 and 2 hours after meal <180.  Recommend continued focus on diet.  Urine ALB 10 today, will add on ACE or ARB at very low dose in future, discussed with patient.  Return to office in 3 months.

## 2020-12-01 NOTE — Assessment & Plan Note (Signed)
Chronic, ongoing.  Continue current medication regimen and adjust as needed.  CMP and lipid panel today.

## 2020-12-01 NOTE — Assessment & Plan Note (Signed)
Ongoing and stable, no growth or changes.  Not causing discomfort.  Continue to monitor and remove if any changes present.

## 2020-12-01 NOTE — Assessment & Plan Note (Signed)
Noted on lung screenings, continue to monitor and continue daily statin.  Consider addition of ASA in future.

## 2020-12-01 NOTE — Assessment & Plan Note (Signed)
Chronic, well controlled.  Continue current medication regimen.  Refills sent.  Spirometry remains stable today and is tolerating inhalers.  Continue smoking cessation.

## 2020-12-01 NOTE — Progress Notes (Signed)
BP 113/70   Pulse 75   Temp 98.1 F (36.7 C) (Oral)   Wt 207 lb 9.6 oz (94.2 kg)   SpO2 96%   BMI 29.37 kg/m    Subjective:    Patient ID: [name], male    DOB: Oct 01, 1960, 60 y.o.   MRN: [id number]  HPI: LEWIS KEATS is a 60 y.o. male  Chief Complaint  Patient presents with  . COPD  . Hyperlipidemia  . Diabetes  . Lipoma  . Other    Patient denies having any problems or concerns at today's visit.   COPD Continues on Symbicort and Albuterol -- has not had to use Albuterol. History of lung CA, had surgery, 2016.  Upper left lobe removed.  Past smoker, quit in 2015.  In 2016 FEV1 was 57% and FEV1/FVC 64% post.  Today levels 56% and 75% pre.   COPD status: stable Satisfied with current treatment?: yes Oxygen use: no Dyspnea frequency: none Cough frequency: none Rescue inhaler frequency: has not used in long time Limitation of activity: no Productive cough: none Last Spirometry: today Pneumovax: Up to Date Influenza: Up to Date   DIABETES At physical in October was started on Metformin 500 MG BID due to A1c 7.4% noted on labs.  Past 7 days average has been 144. Hypoglycemic episodes:no Polydipsia/polyuria: no Visual disturbance: no Chest pain: no Paresthesias: no Glucose Monitoring: yes  Accucheck frequency: a few times a week  Fasting glucose: 114 to 140  Post prandial:  Evening: 180  Before meals: Taking Insulin?: no  Long acting insulin:  Short acting insulin: Blood Pressure Monitoring: not checking Retinal Examination: Not up to Date -- goes next Saturday Foot Exam: Up to Date Diabetic Education: Not Completed Pneumovax: Up to Date Influenza: Up to Date Aspirin: no   HYPERLIPIDEMIA Continues on Atorvastatin 20 MG daily. Hyperlipidemia status: good compliance Satisfied with current treatment?  yes Side effects:  no Medication compliance: good compliance Past cholesterol meds: none Supplements: none Aspirin:  no The 10-year ASCVD risk  score Mikey Bussing DC Jr., et al., 2013) is: 9.6%   Values used to calculate the score:     Age: 46 years     Sex: Male     Is Non-Hispanic African American: No     Diabetic: Yes     Tobacco smoker: No     Systolic Blood Pressure: 093 mmHg     Is BP treated: No     HDL Cholesterol: 49 mg/dL     Total Cholesterol: 186 mg/dL Chest pain:  no Coronary artery disease:  no Family history CAD:  no Family history early CAD:  no  Relevant past medical, surgical, family and social history reviewed and updated as indicated. Interim medical history since our last visit reviewed. Allergies and medications reviewed and updated.  Review of Systems  Constitutional: Negative for activity change, diaphoresis, fatigue and fever.  Respiratory: Negative for cough, chest tightness, shortness of breath and wheezing.   Cardiovascular: Negative for chest pain, palpitations and leg swelling.  Gastrointestinal: Negative.   Endocrine: Negative for polydipsia, polyphagia and polyuria.  Neurological: Negative.   Psychiatric/Behavioral: Negative.     Per HPI unless specifically indicated above     Objective:    BP 113/70   Pulse 75   Temp 98.1 F (36.7 C) (Oral)   Wt 207 lb 9.6 oz (94.2 kg)   SpO2 96%   BMI 29.37 kg/m   Wt Readings from Last 3 Encounters:  12/01/20  207 lb 9.6 oz (94.2 kg)  06/02/20 208 lb 9.6 oz (94.6 kg)  05/29/19 210 lb (95.3 kg)    Physical Exam Vitals and nursing note reviewed.  Constitutional:      General: He is awake. He is not in acute distress.    Appearance: He is well-developed and well-groomed. He is not ill-appearing.  HENT:     Head: Normocephalic and atraumatic.     Right Ear: Hearing normal. No drainage.     Left Ear: Hearing normal. No drainage.  Eyes:     General: Lids are normal.        Right eye: No discharge.        Left eye: No discharge.     Conjunctiva/sclera: Conjunctivae normal.     Pupils: Pupils are equal, round, and reactive to light.  Neck:      Thyroid: No thyromegaly.     Vascular: No carotid bruit.     Trachea: Trachea normal.  Cardiovascular:     Rate and Rhythm: Normal rate and regular rhythm.     Heart sounds: Normal heart sounds, S1 normal and S2 normal. No murmur heard. No gallop.   Pulmonary:     Effort: Pulmonary effort is normal. No accessory muscle usage or respiratory distress.     Breath sounds: Normal breath sounds.  Abdominal:     General: Bowel sounds are normal.     Palpations: Abdomen is soft. There is no hepatomegaly or splenomegaly.  Musculoskeletal:        General: Normal range of motion.     Cervical back: Normal range of motion and neck supple.     Right lower leg: No edema.     Left lower leg: No edema.  Lymphadenopathy:     Cervical: No cervical adenopathy.  Skin:    General: Skin is warm and dry.     Capillary Refill: Capillary refill takes less than 2 seconds.       Neurological:     Mental Status: He is alert and oriented to person, place, and time.     Deep Tendon Reflexes: Reflexes are normal and symmetric.  Psychiatric:        Attention and Perception: Attention normal.        Mood and Affect: Mood normal.        Speech: Speech normal.        Behavior: Behavior normal. Behavior is cooperative.        Thought Content: Thought content normal.    Diabetic Foot Exam - Simple   Simple Foot Form Visual Inspection See comments: Yes Sensation Testing Intact to touch and monofilament testing bilaterally: Yes Pulse Check Posterior Tibialis and Dorsalis pulse intact bilaterally: Yes Comments Onychomycosis.    Results for orders placed or performed in visit on 06/02/20  Comprehensive metabolic panel  Result Value Ref Range   Glucose 156 (H) 65 - 99 mg/dL   BUN 15 6 - 24 mg/dL   Creatinine, Ser 0.85 0.76 - 1.27 mg/dL   GFR calc non Af Amer 97 >59 mL/min/1.73   GFR calc Af Amer 112 >59 mL/min/1.73   BUN/Creatinine Ratio 18 9 - 20   Sodium 139 134 - 144 mmol/L   Potassium 4.5 3.5 -  5.2 mmol/L   Chloride 101 96 - 106 mmol/L   CO2 25 20 - 29 mmol/L   Calcium 9.1 8.7 - 10.2 mg/dL   Total Protein 6.1 6.0 - 8.5 g/dL   Albumin 4.3 3.8 - 4.9  g/dL   Globulin, Total 1.8 1.5 - 4.5 g/dL   Albumin/Globulin Ratio 2.4 (H) 1.2 - 2.2   Bilirubin Total 0.8 0.0 - 1.2 mg/dL   Alkaline Phosphatase 84 44 - 121 IU/L   AST 18 0 - 40 IU/L   ALT 39 0 - 44 IU/L  CBC with Differential/Platelet  Result Value Ref Range   WBC 6.3 3.4 - 10.8 x10E3/uL   RBC 5.27 4.14 - 5.80 x10E6/uL   Hemoglobin 14.8 13.0 - 17.7 g/dL   Hematocrit 44.9 37.5 - 51.0 %   MCV 85 79 - 97 fL   MCH 28.1 26.6 - 33.0 pg   MCHC 33.0 31.5 - 35.7 g/dL   RDW 12.3 11.6 - 15.4 %   Platelets 204 150 - 450 x10E3/uL   Neutrophils 63 Not Estab. %   Lymphs 24 Not Estab. %   Monocytes 9 Not Estab. %   Eos 2 Not Estab. %   Basos 1 Not Estab. %   Neutrophils Absolute 4.0 1.4 - 7.0 x10E3/uL   Lymphocytes Absolute 1.5 0.7 - 3.1 x10E3/uL   Monocytes Absolute 0.6 0.1 - 0.9 x10E3/uL   EOS (ABSOLUTE) 0.1 0.0 - 0.4 x10E3/uL   Basophils Absolute 0.0 0.0 - 0.2 x10E3/uL   Immature Granulocytes 1 Not Estab. %   Immature Grans (Abs) 0.0 0.0 - 0.1 x10E3/uL  Lipid Panel w/o Chol/HDL Ratio  Result Value Ref Range   Cholesterol, Total 186 100 - 199 mg/dL   Triglycerides 154 (H) 0 - 149 mg/dL   HDL 49 >39 mg/dL   VLDL Cholesterol Cal 27 5 - 40 mg/dL   LDL Chol Calc (NIH) 110 (H) 0 - 99 mg/dL  TSH  Result Value Ref Range   TSH 1.280 0.450 - 4.500 uIU/mL  PSA  Result Value Ref Range   Prostate Specific Ag, Serum 0.9 0.0 - 4.0 ng/mL  Hemoglobin A1c  Result Value Ref Range   Hgb A1c MFr Bld 7.4 (H) 4.8 - 5.6 %   Est. average glucose Bld gHb Est-mCnc 166 mg/dL  Specimen status report  Result Value Ref Range   specimen status report Comment       Assessment & Plan:   Problem List Items Addressed This Visit      Cardiovascular and Mediastinum   Aortic atherosclerosis ([place])    Noted on lung screenings, continue to monitor and  continue daily statin.  Consider addition of ASA in future.      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  Will increase Metformin to 1000 MG in morning and 500 MG at night, he is tolerating this medication well.  Goal A1c <7% and educated him on sugar goals = fasting <130 and 2 hours after meal <180.  Recommend continued focus on diet.  Urine ALB 10 today, will add on ACE or ARB at very low dose in future, discussed with patient.  Return to office in 3 months.      Relevant Medications   atorvastatin (LIPITOR) 20 MG tablet   metFORMIN (GLUCOPHAGE) 500 MG tablet   Other Relevant Orders   Bayer DCA Hb A1c Waived   Microalbumin, Urine Waived     Respiratory   Centrilobular emphysema (HCC)    Chronic, well controlled.  Continue current medication regimen.  Refills sent.  Spirometry remains stable today  and is tolerating inhalers.  Continue smoking cessation.      Relevant Medications   budesonide-formoterol (SYMBICORT) 160-4.5 MCG/ACT inhaler   Other Relevant Orders   Spirometry with graph (Completed)     Endocrine   Hyperlipidemia associated with type 2 diabetes mellitus (HCC)    Chronic, ongoing.  Continue current medication regimen and adjust as needed.  CMP and lipid panel today.      Relevant Medications   atorvastatin (LIPITOR) 20 MG tablet   metFORMIN (GLUCOPHAGE) 500 MG tablet   Other Relevant Orders   Bayer DCA Hb A1c Waived   Comprehensive metabolic panel   Lipid Panel w/o Chol/HDL Ratio     Other   History of lung cancer    Continue yearly lung screening and monitoring.  Continue smoking cessation.      Lipoma of back    Ongoing and stable, no growth or changes.  Not causing discomfort.  Continue to monitor and remove if any changes present.      History of smoking    Continue complete cessation.           Follow up plan: Return in about 3 months (around 03/02/2021) for T2DM, HLD.

## 2020-12-01 NOTE — Patient Instructions (Signed)
Diabetes Mellitus and Nutrition, Adult When you have diabetes, or diabetes mellitus, it is very important to have healthy eating habits because your blood sugar (glucose) levels are greatly affected by what you eat and drink. Eating healthy foods in the right amounts, at about the same times every day, can help you:  Control your blood glucose.  Lower your risk of heart disease.  Improve your blood pressure.  Reach or maintain a healthy weight. What can affect my meal plan? Every person with diabetes is different, and each person has different needs for a meal plan. Your health care provider may recommend that you work with a dietitian to make a meal plan that is best for you. Your meal plan may vary depending on factors such as:  The calories you need.  The medicines you take.  Your weight.  Your blood glucose, blood pressure, and cholesterol levels.  Your activity level.  Other health conditions you have, such as heart or kidney disease. How do carbohydrates affect me? Carbohydrates, also called carbs, affect your blood glucose level more than any other type of food. Eating carbs naturally raises the amount of glucose in your blood. Carb counting is a method for keeping track of how many carbs you eat. Counting carbs is important to keep your blood glucose at a healthy level, especially if you use insulin or take certain oral diabetes medicines. It is important to know how many carbs you can safely have in each meal. This is different for every person. Your dietitian can help you calculate how many carbs you should have at each meal and for each snack. How does alcohol affect me? Alcohol can cause a sudden decrease in blood glucose (hypoglycemia), especially if you use insulin or take certain oral diabetes medicines. Hypoglycemia can be a life-threatening condition. Symptoms of hypoglycemia, such as sleepiness, dizziness, and confusion, are similar to symptoms of having too much  alcohol.  Do not drink alcohol if: ? Your health care provider tells you not to drink. ? You are pregnant, may be pregnant, or are planning to become pregnant.  If you drink alcohol: ? Do not drink on an empty stomach. ? Limit how much you use to:  0-1 drink a day for women.  0-2 drinks a day for men. ? Be aware of how much alcohol is in your drink. In the [place] one drink equals one 12 oz bottle of beer (355 mL), one 5 oz glass of wine (148 mL), or one 1 oz glass of hard liquor (44 mL). ? Keep yourself hydrated with water, diet soda, or unsweetened iced tea.  Keep in mind that regular soda, juice, and other mixers may contain a lot of sugar and must be counted as carbs. What are tips for following this plan? Reading food labels  Start by checking the serving size on the "Nutrition Facts" label of packaged foods and drinks. The amount of calories, carbs, fats, and other nutrients listed on the label is based on one serving of the item. Many items contain more than one serving per package.  Check the total grams (g) of carbs in one serving. You can calculate the number of servings of carbs in one serving by dividing the total carbs by 15. For example, if a food has 30 g of total carbs per serving, it would be equal to 2 servings of carbs.  Check the number of grams (g) of saturated fats and trans fats in one serving. Choose foods that have   a low amount or none of these fats.  Check the number of milligrams (mg) of salt (sodium) in one serving. Most people should limit total sodium intake to less than 2,300 mg per day.  Always check the nutrition information of foods labeled as "low-fat" or "nonfat." These foods may be higher in added sugar or refined carbs and should be avoided.  Talk to your dietitian to identify your daily goals for nutrients listed on the label. Shopping  Avoid buying canned, pre-made, or processed foods. These foods tend to be high in fat, sodium, and added  sugar.  Shop around the outside edge of the grocery store. This is where you will most often find fresh fruits and vegetables, bulk grains, fresh meats, and fresh dairy. Cooking  Use low-heat cooking methods, such as baking, instead of high-heat cooking methods like deep frying.  Cook using healthy oils, such as olive, canola, or sunflower oil.  Avoid cooking with butter, cream, or high-fat meats. Meal planning  Eat meals and snacks regularly, preferably at the same times every day. Avoid going long periods of time without eating.  Eat foods that are high in fiber, such as fresh fruits, vegetables, beans, and whole grains. Talk with your dietitian about how many servings of carbs you can eat at each meal.  Eat 4-6 oz (112-168 g) of lean protein each day, such as lean meat, chicken, fish, eggs, or tofu. One ounce (oz) of lean protein is equal to: ? 1 oz (28 g) of meat, chicken, or fish. ? 1 egg. ?  cup (62 g) of tofu.  Eat some foods each day that contain healthy fats, such as avocado, nuts, seeds, and fish.   What foods should I eat? Fruits Berries. Apples. Oranges. Peaches. Apricots. Plums. Grapes. Mango. Papaya. Pomegranate. Kiwi. Cherries. Vegetables Lettuce. Spinach. Leafy greens, including kale, chard, collard greens, and mustard greens. Beets. Cauliflower. Cabbage. Broccoli. Carrots. Green beans. Tomatoes. Peppers. Onions. Cucumbers. Brussels sprouts. Grains Whole grains, such as whole-wheat or whole-grain bread, crackers, tortillas, cereal, and pasta. Unsweetened oatmeal. Quinoa. Brown or wild rice. Meats and other proteins Seafood. Poultry without skin. Lean cuts of poultry and beef. Tofu. Nuts. Seeds. Dairy Low-fat or fat-free dairy products such as milk, yogurt, and cheese. The items listed above may not be a complete list of foods and beverages you can eat. Contact a dietitian for more information. What foods should I avoid? Fruits Fruits canned with  syrup. Vegetables Canned vegetables. Frozen vegetables with butter or cream sauce. Grains Refined white flour and flour products such as bread, pasta, snack foods, and cereals. Avoid all processed foods. Meats and other proteins Fatty cuts of meat. Poultry with skin. Breaded or fried meats. Processed meat. Avoid saturated fats. Dairy Full-fat yogurt, cheese, or milk. Beverages Sweetened drinks, such as soda or iced tea. The items listed above may not be a complete list of foods and beverages you should avoid. Contact a dietitian for more information. Questions to ask a health care provider  Do I need to meet with a diabetes educator?  Do I need to meet with a dietitian?  What number can I call if I have questions?  When are the best times to check my blood glucose? Where to find more information:  American Diabetes Association: diabetes.org  Academy of Nutrition and Dietetics: www.eatright.org  National Institute of Diabetes and Digestive and Kidney Diseases: www.niddk.nih.gov  Association of Diabetes Care and Education Specialists: www.diabeteseducator.org Summary  It is important to have healthy eating   habits because your blood sugar (glucose) levels are greatly affected by what you eat and drink.  A healthy meal plan will help you control your blood glucose and maintain a healthy lifestyle.  Your health care provider may recommend that you work with a dietitian to make a meal plan that is best for you.  Keep in mind that carbohydrates (carbs) and alcohol have immediate effects on your blood glucose levels. It is important to count carbs and to use alcohol carefully. This information is not intended to replace advice given to you by your health care provider. Make sure you discuss any questions you have with your health care provider. Document Revised: 07/17/2019 Document Reviewed: 07/17/2019 Elsevier Patient Education  2021 Elsevier Inc.  

## 2020-12-01 NOTE — Assessment & Plan Note (Signed)
Continue yearly lung screening and monitoring.  Continue smoking cessation.

## 2020-12-02 LAB — COMPREHENSIVE METABOLIC PANEL
ALT: 26 IU/L (ref 0–44)
AST: 16 IU/L (ref 0–40)
Albumin/Globulin Ratio: 2.4 — ABNORMAL HIGH (ref 1.2–2.2)
Albumin: 4.5 g/dL (ref 3.8–4.9)
Alkaline Phosphatase: 83 IU/L (ref 44–121)
BUN/Creatinine Ratio: 16 (ref 9–20)
BUN: 17 mg/dL (ref 6–24)
Bilirubin Total: 0.7 mg/dL (ref 0.0–1.2)
CO2: 25 mmol/L (ref 20–29)
Calcium: 9.9 mg/dL (ref 8.7–10.2)
Chloride: 97 mmol/L (ref 96–106)
Creatinine, Ser: 1.05 mg/dL (ref 0.76–1.27)
Globulin, Total: 1.9 g/dL (ref 1.5–4.5)
Glucose: 167 mg/dL — ABNORMAL HIGH (ref 65–99)
Potassium: 5 mmol/L (ref 3.5–5.2)
Sodium: 135 mmol/L (ref 134–144)
Total Protein: 6.4 g/dL (ref 6.0–8.5)
eGFR: 83 mL/min/{1.73_m2} (ref >59)

## 2020-12-02 LAB — LIPID PANEL W/O CHOL/HDL RATIO
Cholesterol, Total: 212 mg/dL — ABNORMAL HIGH (ref 100–199)
HDL: 56 mg/dL (ref >39)
LDL Chol Calc (NIH): 132 mg/dL — ABNORMAL HIGH (ref 0–99)
Triglycerides: 133 mg/dL (ref 0–149)
VLDL Cholesterol Cal: 24 mg/dL (ref 5–40)

## 2020-12-02 NOTE — Progress Notes (Signed)
Contacted via [place] evening Pilar Plate, your labs have returned.  Kidney function, eGFR and creatinine, and liver function, AST and ALT, are normal.  Glucose is elevated, as expected, continue as recommended the increase in Metformin with two tablets (1000 MG) in morning and one tablet (500 MG) at night -- if sugars continue to trend >130 fasting and >180 two hours after a meal go to two tablets twice a day.  Cholesterol levels remain elevated, I would recommend increasing Atorvastatin to 40 MG daily, are you okay with this? If so I will send this new script in and you would stop 20 MG tablets.  Any questions? Keep being amazing!!  Thank you for allowing me to participate in your care. Kindest regards, Tiamarie Furnari

## 2020-12-06 DIAGNOSIS — E089 Diabetes mellitus due to underlying condition without complications: Secondary | ICD-10-CM | POA: Diagnosis not present

## 2020-12-06 DIAGNOSIS — H524 Presbyopia: Secondary | ICD-10-CM | POA: Diagnosis not present

## 2020-12-06 DIAGNOSIS — E119 Type 2 diabetes mellitus without complications: Secondary | ICD-10-CM | POA: Diagnosis not present

## 2021-03-09 ENCOUNTER — Encounter: Payer: Self-pay | Admitting: Nurse Practitioner

## 2021-03-09 ENCOUNTER — Ambulatory Visit (INDEPENDENT_AMBULATORY_CARE_PROVIDER_SITE_OTHER): Payer: BC Managed Care – PPO | Admitting: Nurse Practitioner

## 2021-03-09 ENCOUNTER — Other Ambulatory Visit: Payer: Self-pay

## 2021-03-09 VITALS — BP 115/70 | HR 69 | Temp 98.3°F | Wt 208.0 lb

## 2021-03-09 DIAGNOSIS — E785 Hyperlipidemia, unspecified: Secondary | ICD-10-CM

## 2021-03-09 DIAGNOSIS — E1159 Type 2 diabetes mellitus with other circulatory complications: Secondary | ICD-10-CM | POA: Diagnosis not present

## 2021-03-09 DIAGNOSIS — Z85118 Personal history of other malignant neoplasm of bronchus and lung: Secondary | ICD-10-CM | POA: Diagnosis not present

## 2021-03-09 DIAGNOSIS — E1169 Type 2 diabetes mellitus with other specified complication: Secondary | ICD-10-CM

## 2021-03-09 DIAGNOSIS — J432 Centrilobular emphysema: Secondary | ICD-10-CM

## 2021-03-09 DIAGNOSIS — Z1211 Encounter for screening for malignant neoplasm of colon: Secondary | ICD-10-CM

## 2021-03-09 LAB — BAYER DCA HB A1C WAIVED: HB A1C (BAYER DCA - WAIVED): 6.8 % (ref <7.0)

## 2021-03-09 NOTE — Progress Notes (Signed)
BP 115/70   Pulse 69   Temp 98.3 F (36.8 C) (Oral)   Wt 208 lb (94.3 kg)   SpO2 96%   BMI 29.42 kg/m    Subjective:    Patient ID: Timothy Benitez, male    DOB: 1961-05-07, 60 y.o.   MRN: [id number]  HPI: Timothy Benitez is a 60 y.o. male  Chief Complaint  Patient presents with   Diabetes    Patient denies having any problems or concerns at today's visit.    Hyperlipidemia   COPD Continues on Symbicort and Albuterol -- has not had to use Albuterol. History of lung CA, had surgery, 2016.  Upper left lobe removed.  Past smoker, quit in 2015.  In April 2022 FEV1 was 56% and FEV1/FVC 75% pre. COPD status: stable Satisfied with current treatment?: yes Oxygen use: no Dyspnea frequency: none Cough frequency: none Rescue inhaler frequency: has not used in long time Limitation of activity: no Productive cough: none Last Spirometry: today Pneumovax: Up to Date Influenza: Up to Date   DIABETES At physical in October 2021 was started on Metformin 500 MG BID due to A1c 7.4% noted on labs.  Last A1c 7% in April.  Currently taking Metformin 1000 MG in morning and 500 MG in evening. Hypoglycemic episodes:no Polydipsia/polyuria: no Visual disturbance: no Chest pain: no Paresthesias: no Glucose Monitoring: yes  Accucheck frequency:  a few times a week  Fasting glucose: 115 to 145 -- average over past 3 months 142  Post prandial:  Evening: 180  Before meals:            Taking Insulin?: no  Long acting insulin:  Short acting insulin: Blood Pressure Monitoring: not checking Retinal Examination: Not up to Date -- [employer], was told no retinopathy Foot Exam: Up to Date Diabetic Education: Not Completed Pneumovax: Up to Date Influenza: Up to Date Aspirin: no   HYPERLIPIDEMIA Continues on Atorvastatin 20 MG daily. Hyperlipidemia status: good compliance Satisfied with current treatment?  yes Side effects:  no Medication compliance: good compliance Past  cholesterol meds: none Supplements: none Aspirin:  no The 10-year ASCVD risk score Mikey Bussing DC Jr., et al., 2013) is: 11.1%   Values used to calculate the score:     Age: 40 years     Sex: Male     Is Non-Hispanic African American: No     Diabetic: Yes     Tobacco smoker: No     Systolic Blood Pressure: 629 mmHg     Is BP treated: No     HDL Cholesterol: 56 mg/dL     Total Cholesterol: 212 mg/dL Chest pain:  no Coronary artery disease:  no Family history CAD:  no Family history early CAD:  no  Relevant past medical, surgical, family and social history reviewed and updated as indicated. Interim medical history since our last visit reviewed. Allergies and medications reviewed and updated.  Review of Systems  Constitutional:  Negative for activity change, diaphoresis, fatigue and fever.  Respiratory:  Negative for cough, chest tightness, shortness of breath and wheezing.   Cardiovascular:  Negative for chest pain, palpitations and leg swelling.  Gastrointestinal: Negative.   Endocrine: Negative for polydipsia, polyphagia and polyuria.  Neurological: Negative.   Psychiatric/Behavioral: Negative.     Per HPI unless specifically indicated above     Objective:    BP 115/70   Pulse 69   Temp 98.3 F (36.8 C) (Oral)   Wt 208 lb (94.3 kg)  SpO2 96%   BMI 29.42 kg/m   Wt Readings from Last 3 Encounters:  03/09/21 208 lb (94.3 kg)  12/01/20 207 lb 9.6 oz (94.2 kg)  06/02/20 208 lb 9.6 oz (94.6 kg)    Physical Exam Vitals and nursing note reviewed.  Constitutional:      General: He is awake. He is not in acute distress.    Appearance: He is well-developed and well-groomed. He is not ill-appearing.  HENT:     Head: Normocephalic and atraumatic.     Right Ear: Hearing normal. No drainage.     Left Ear: Hearing normal. No drainage.  Eyes:     General: Lids are normal.        Right eye: No discharge.        Left eye: No discharge.     Conjunctiva/sclera: Conjunctivae  normal.     Pupils: Pupils are equal, round, and reactive to light.  Neck:     Thyroid: No thyromegaly.     Vascular: No carotid bruit.     Trachea: Trachea normal.  Cardiovascular:     Rate and Rhythm: Normal rate and regular rhythm.     Heart sounds: Normal heart sounds, S1 normal and S2 normal. No murmur heard.   No gallop.  Pulmonary:     Effort: Pulmonary effort is normal. No accessory muscle usage or respiratory distress.     Breath sounds: Normal breath sounds.  Abdominal:     General: Bowel sounds are normal.     Palpations: Abdomen is soft.  Musculoskeletal:        General: Normal range of motion.     Cervical back: Normal range of motion and neck supple.     Right lower leg: No edema.     Left lower leg: No edema.  Lymphadenopathy:     Cervical: No cervical adenopathy.  Skin:    General: Skin is warm and dry.     Capillary Refill: Capillary refill takes less than 2 seconds.  Neurological:     Mental Status: He is alert and oriented to person, place, and time.     Deep Tendon Reflexes: Reflexes are normal and symmetric.  Psychiatric:        Attention and Perception: Attention normal.        Mood and Affect: Mood normal.        Speech: Speech normal.        Behavior: Behavior normal. Behavior is cooperative.        Thought Content: Thought content normal.    Results for orders placed or performed in visit on 12/01/20  Bayer DCA Hb A1c Waived  Result Value Ref Range   HB A1C (BAYER DCA - WAIVED) 7.0 (H) <7.0 %  Microalbumin, Urine Waived  Result Value Ref Range   Microalb, Ur Waived 10 0 - 19 mg/L   Creatinine, Urine Waived 300 10 - 300 mg/dL   Microalb/Creat Ratio <30 <30 mg/g  Comprehensive metabolic panel  Result Value Ref Range   Glucose 167 (H) 65 - 99 mg/dL   BUN 17 6 - 24 mg/dL   Creatinine, Ser 1.05 0.76 - 1.27 mg/dL   eGFR 83 >59 mL/min/1.73   BUN/Creatinine Ratio 16 9 - 20   Sodium 135 134 - 144 mmol/L   Potassium 5.0 3.5 - 5.2 mmol/L   Chloride  97 96 - 106 mmol/L   CO2 25 20 - 29 mmol/L   Calcium 9.9 8.7 - 10.2 mg/dL   Total  Protein 6.4 6.0 - 8.5 g/dL   Albumin 4.5 3.8 - 4.9 g/dL   Globulin, Total 1.9 1.5 - 4.5 g/dL   Albumin/Globulin Ratio 2.4 (H) 1.2 - 2.2   Bilirubin Total 0.7 0.0 - 1.2 mg/dL   Alkaline Phosphatase 83 44 - 121 IU/L   AST 16 0 - 40 IU/L   ALT 26 0 - 44 IU/L  Lipid Panel w/o Chol/HDL Ratio  Result Value Ref Range   Cholesterol, Total 212 (H) 100 - 199 mg/dL   Triglycerides 133 0 - 149 mg/dL   HDL 56 >39 mg/dL   VLDL Cholesterol Cal 24 5 - 40 mg/dL   LDL Chol Calc (NIH) 132 (H) 0 - 99 mg/dL      Assessment & Plan:   Problem List Items Addressed This Visit       Cardiovascular and Mediastinum   Type 2 diabetes mellitus with cardiac complication [facility]) - Primary    Ongoing, diagnosed October 2021 -- A1c today 6.8%, improving.  Will continue Metformin 1000 MG in morning and 500 MG at night, he is tolerating this medication well.  Goal A1c <7% and educated him on sugar goals = fasting <130 and 2 hours after meal <180.  Recommend continued focus on diet.  Urine ALB 30 November 2020, will add on ACE or ARB at very low dose in future, discussed with patient.  Return to office in 3 months.       Relevant Orders   Bayer DCA Hb A1c Waived   Comprehensive metabolic panel   Lipid Panel w/o Chol/HDL Ratio     Respiratory   Centrilobular emphysema (HCC)    Chronic, well controlled.  Continue current medication regimen.  Refills sent.  Spirometry remains stable April 2022 and is tolerating inhalers.  Continue smoking cessation.  Discussed with patient in future could change Symbicort to LABA/LAMA like Anoro and educated him on this.         Endocrine   Hyperlipidemia associated with type 2 diabetes mellitus (HCC)    Chronic, ongoing.  Continue current medication regimen and adjust as needed.  CMP and lipid panel today.         Other   History of lung cancer    Continue yearly lung screening and monitoring.   Continue smoking cessation.       Other Visit Diagnoses     Colon cancer screening       Cologuard ordered.   Relevant Orders   Cologuard        Follow up plan: Return in about 3 months (around 06/09/2021) for Annual physical and diabetes check.

## 2021-03-09 NOTE — Assessment & Plan Note (Signed)
Continue yearly lung screening and monitoring.  Continue smoking cessation.

## 2021-03-09 NOTE — Assessment & Plan Note (Signed)
Ongoing, diagnosed October 2021 -- A1c today 6.8%, improving.  Will continue Metformin 1000 MG in morning and 500 MG at night, he is tolerating this medication well.  Goal A1c <7% and educated him on sugar goals = fasting <130 and 2 hours after meal <180.  Recommend continued focus on diet.  Urine ALB 30 November 2020, will add on ACE or ARB at very low dose in future, discussed with patient.  Return to office in 3 months.

## 2021-03-09 NOTE — Patient Instructions (Signed)
Check on Anoro for inhaler regimen.  COPD and Physical Activity Chronic obstructive pulmonary disease (COPD) is a long-term, or chronic, condition that affects the lungs. COPD is a general term that can be used to describe many problems that cause inflammation of the lungs and limit airflow.These conditions include chronic bronchitis and emphysema. The main symptom of COPD is shortness of breath, which makes it harder to do even simple tasks. This can also make it harder to exercise and stay active. Talk with your health care provider about treatments to help you breathe betterand actions you can take to prevent breathing problems during physical activity. What are the benefits of exercising when you have COPD? Exercising regularly is an important part of a healthy lifestyle. You can still exercise and do physical activities even though you have COPD. Exercise and physical activity improve your shortness of breath by increasing blood flow (circulation). This causes your heart to pump more oxygen through your body. Moderate exercise can: Improve oxygen use. Increase your energy level. Help with shortness of breath. Strengthen your breathing muscles. Improve heart health. Help with sleep. Improve your self-esteem and feelings of self-worth. Lower depression, stress, and anxiety. Exercise can benefit everyone with COPD. The severity of your disease may affect how hard you can exercise, especially at first, but everyone can benefit. Talk with your health care provider about how much exercise is safefor you, and which activities and exercises are safe for you. What actions can I take to prevent breathing problems during physical activity? Sign up for a pulmonary rehabilitation program. This type of program may include: Education about lung diseases. Exercise classes that teach you how to exercise and be more active while improving your breathing. This usually involves: Exercise using your lower  extremities, such as a stationary bicycle. About 30 minutes of exercise, 2 to 5 times per week, for 6 to 12 weeks. Strength training, such as push-ups or leg lifts. Nutrition education. Group classes in which you can talk with others who also have COPD and learn ways to manage stress. If you use an oxygen tank, you should use it while you exercise. Work with your health care provider to adjust your oxygen for your physical activity. Your resting flow rate is different from your flow rate during physical activity. How to manage your breathing while exercising While you are exercising: Take slow breaths. Pace yourself, and do nottry to go too fast. Purse your lips while breathing out. Pursing your lips is similar to a kissing or whistling position. If doing exercise that uses a quick burst of effort, such as weight lifting: Breathe in before starting the exercise. Breathe out during the hardest part of the exercise, such as raising the weights. Where to find support You can find support for exercising with COPD from: Your health care provider. A pulmonary rehabilitation program. Your local health department or community health programs. Support groups, either online or in-person. Your health care provider may be able to recommend support groups. Where to find more information You can find more information about exercising with COPD from: American Lung Association: lung.org COPD Foundation: copdfoundation.org Contact a health care provider if: Your symptoms get worse. You have nausea. You have a fever. You want to start a new exercise program or a new activity. Get help right away if: You have chest pain. You cannot breathe. These symptoms may represent a serious problem that is an emergency. Do not wait to see if the symptoms will go away. Get medical  help right away. Call your local emergency services (911 in the [place] Do not drive yourself to the hospital. Summary COPD is a general  term that can be used to describe many different lung problems that cause lung inflammation and limit airflow. This includes chronic bronchitis and emphysema. Exercise and physical activity improve your shortness of breath by increasing blood flow (circulation). This causes your heart to provide more oxygen to your body. Contact your health care provider before starting any exercise program or new activity. Ask your health care provider what exercises and activities are safe for you. This information is not intended to replace advice given to you by your health care provider. Make sure you discuss any questions you have with your healthcare provider. Document Revised: 06/17/2020 Document Reviewed: 06/17/2020 Elsevier Patient Education  2022 [employer].

## 2021-03-09 NOTE — Assessment & Plan Note (Signed)
Chronic, ongoing.  Continue current medication regimen and adjust as needed.  CMP and lipid panel today.

## 2021-03-09 NOTE — Assessment & Plan Note (Signed)
Chronic, well controlled.  Continue current medication regimen.  Refills sent.  Spirometry remains stable April 2022 and is tolerating inhalers.  Continue smoking cessation.  Discussed with patient in future could change Symbicort to LABA/LAMA like Anoro and educated him on this.

## 2021-03-10 ENCOUNTER — Other Ambulatory Visit: Payer: Self-pay | Admitting: Nurse Practitioner

## 2021-03-10 LAB — COMPREHENSIVE METABOLIC PANEL
ALT: 27 IU/L (ref 0–44)
AST: 16 IU/L (ref 0–40)
Albumin/Globulin Ratio: 2 (ref 1.2–2.2)
Albumin: 4.3 g/dL (ref 3.8–4.9)
Alkaline Phosphatase: 77 IU/L (ref 44–121)
BUN/Creatinine Ratio: 17 (ref 9–20)
BUN: 13 mg/dL (ref 6–24)
Bilirubin Total: 0.6 mg/dL (ref 0.0–1.2)
CO2: 26 mmol/L (ref 20–29)
Calcium: 9.4 mg/dL (ref 8.7–10.2)
Chloride: 98 mmol/L (ref 96–106)
Creatinine, Ser: 0.75 mg/dL — ABNORMAL LOW (ref 0.76–1.27)
Globulin, Total: 2.1 g/dL (ref 1.5–4.5)
Glucose: 129 mg/dL — ABNORMAL HIGH (ref 65–99)
Potassium: 5.2 mmol/L (ref 3.5–5.2)
Sodium: 141 mmol/L (ref 134–144)
Total Protein: 6.4 g/dL (ref 6.0–8.5)
eGFR: 105 mL/min/{1.73_m2} (ref >59)

## 2021-03-10 LAB — LIPID PANEL W/O CHOL/HDL RATIO
Cholesterol, Total: 214 mg/dL — ABNORMAL HIGH (ref 100–199)
HDL: 57 mg/dL (ref >39)
LDL Chol Calc (NIH): 128 mg/dL — ABNORMAL HIGH (ref 0–99)
Triglycerides: 162 mg/dL — ABNORMAL HIGH (ref 0–149)
VLDL Cholesterol Cal: 29 mg/dL (ref 5–40)

## 2021-03-10 MED ORDER — ATORVASTATIN CALCIUM 40 MG PO TABS: 40.0000 mg | ORAL_TABLET | Freq: Every day | ORAL | 4 refills | Status: DC

## 2021-03-10 NOTE — Progress Notes (Signed)
Contacted via [place] morning Pilar Plate, your labs have returned.  Kidney and liver function remain stable.  Cholesterol levels continue to show LDL above goal, would like to see less then 70.  I am going to increase your Atorvastatin to 40 MG, stop the 20 MG -- if you have these left you can take 2 daily to complete them and then pick up 40 MG tablets.  We will recheck levels next visit.  Any questions? Keep being awesome!!  Thank you for allowing me to participate in your care.  I appreciate you. Kindest regards, Romesha Scherer

## 2021-03-12 ENCOUNTER — Telehealth: Payer: Self-pay

## 2021-03-12 ENCOUNTER — Other Ambulatory Visit: Payer: Self-pay | Admitting: Nurse Practitioner

## 2021-03-12 MED ORDER — ATORVASTATIN CALCIUM 40 MG PO TABS: 40.0000 mg | ORAL_TABLET | Freq: Every day | ORAL | 4 refills | Status: DC

## 2021-03-12 NOTE — Telephone Encounter (Signed)
Patient states he changed his pharmacy to [facility] on [address]. In [place]. Could you resend his atorvastatin there please.

## 2021-03-19 DIAGNOSIS — Z1211 Encounter for screening for malignant neoplasm of colon: Secondary | ICD-10-CM | POA: Diagnosis not present

## 2021-03-26 LAB — COLOGUARD: Cologuard: NEGATIVE

## 2021-07-13 ENCOUNTER — Encounter: Payer: Self-pay | Admitting: Nurse Practitioner

## 2021-07-13 ENCOUNTER — Other Ambulatory Visit: Payer: Self-pay

## 2021-07-13 ENCOUNTER — Ambulatory Visit (INDEPENDENT_AMBULATORY_CARE_PROVIDER_SITE_OTHER): Payer: BC Managed Care – PPO | Admitting: Nurse Practitioner

## 2021-07-13 VITALS — BP 124/74 | HR 73 | Temp 97.9°F | Ht 69.75 in | Wt 212.4 lb

## 2021-07-13 DIAGNOSIS — I700 Atherosclerosis of aorta: Secondary | ICD-10-CM | POA: Diagnosis not present

## 2021-07-13 DIAGNOSIS — E785 Hyperlipidemia, unspecified: Secondary | ICD-10-CM

## 2021-07-13 DIAGNOSIS — Z0000 Encounter for general adult medical examination without abnormal findings: Secondary | ICD-10-CM

## 2021-07-13 DIAGNOSIS — Z85118 Personal history of other malignant neoplasm of bronchus and lung: Secondary | ICD-10-CM

## 2021-07-13 DIAGNOSIS — I2583 Coronary atherosclerosis due to lipid rich plaque: Secondary | ICD-10-CM

## 2021-07-13 DIAGNOSIS — E1159 Type 2 diabetes mellitus with other circulatory complications: Secondary | ICD-10-CM | POA: Diagnosis not present

## 2021-07-13 DIAGNOSIS — Z87891 Personal history of nicotine dependence: Secondary | ICD-10-CM

## 2021-07-13 DIAGNOSIS — E538 Deficiency of other specified B group vitamins: Secondary | ICD-10-CM

## 2021-07-13 DIAGNOSIS — E559 Vitamin D deficiency, unspecified: Secondary | ICD-10-CM | POA: Diagnosis not present

## 2021-07-13 DIAGNOSIS — J432 Centrilobular emphysema: Secondary | ICD-10-CM | POA: Diagnosis not present

## 2021-07-13 DIAGNOSIS — N400 Enlarged prostate without lower urinary tract symptoms: Secondary | ICD-10-CM

## 2021-07-13 DIAGNOSIS — I2510 Atherosclerotic heart disease of native coronary artery without angina pectoris: Secondary | ICD-10-CM

## 2021-07-13 DIAGNOSIS — E1169 Type 2 diabetes mellitus with other specified complication: Secondary | ICD-10-CM | POA: Diagnosis not present

## 2021-07-13 LAB — BAYER DCA HB A1C WAIVED: HB A1C (BAYER DCA - WAIVED): 6.8 % — ABNORMAL HIGH (ref 4.8–5.6)

## 2021-07-13 LAB — MICROALBUMIN, URINE WAIVED
Creatinine, Urine Waived: 50 mg/dL (ref 10–300)
Microalb, Ur Waived: 10 mg/L (ref 0–19)
Microalb/Creat Ratio: <30 mg/g (ref <30)

## 2021-07-13 MED ORDER — METFORMIN HCL 500 MG PO TABS
ORAL_TABLET | ORAL | 4 refills | Status: DC
Start: 2021-07-13 — End: 2022-07-21

## 2021-07-13 NOTE — Assessment & Plan Note (Signed)
Noted on lung screenings, continue to monitor and continue daily statin.  Consider addition of ASA in future.

## 2021-07-13 NOTE — Assessment & Plan Note (Addendum)
Ongoing, diagnosed October 2021 -- A1c today 6.8%, stable.  Will continue Metformin 1000 MG in morning and 500 MG at night, he is tolerating this medication well.  Goal A1c <7% and educated him on sugar goals = fasting <130 and 2 hours after meal <180.  Recommend continued focus on diet.  Urine ALB 10 today, will add on ACE or ARB at very low dose in future, discussed with patient.  Return to office in 6 months.

## 2021-07-13 NOTE — Assessment & Plan Note (Signed)
Continue yearly lung screening and monitoring.  Continue smoking cessation.

## 2021-07-13 NOTE — Assessment & Plan Note (Signed)
Noted on past lung screening, continue daily statin and consider addition of ASA in future.

## 2021-07-13 NOTE — Assessment & Plan Note (Signed)
Chronic, well controlled.  Continue current medication regimen. Spirometry remains stable April 2022 and is tolerating inhalers.  Continue smoking cessation.  Discussed with patient in future could change Symbicort to LABA/LAMA like Anoro and educated him on this.

## 2021-07-13 NOTE — Assessment & Plan Note (Signed)
Continue complete cessation.

## 2021-07-13 NOTE — Patient Instructions (Signed)

## 2021-07-13 NOTE — Progress Notes (Signed)
BP 124/74   Pulse 73   Temp 97.9 F (36.6 C) (Oral)   Ht 5' 9.75" (1.772 m)   Wt 212 lb 6.4 oz (96.3 kg)   BMI 30.70 kg/m    Subjective:    Patient ID: Timothy Benitez, male    DOB: 14-Feb-1961, 60 y.o.   MRN: [id number]  HPI: Timothy Benitez is a 60 y.o. male presenting on 07/13/2021 for comprehensive medical examination. Current medical complaints include:none  He currently lives with: significant other Interim Problems from his last visit: no  COPD Using Symbicort and Albuterol -- has not had to use Albuterol. History of lung CA, had surgery, 2016.  Upper left lobe removed.    Past smoker, quit in 2015.  In April 2022 FEV1 was 56% and FEV1/FVC 75% pre.  Last lung screening 06/02/2020 = centrilobular emphysema and aortic atherosclerosis. COPD status: stable Satisfied with current treatment?: yes Oxygen use: no Dyspnea frequency: none Cough frequency: none Rescue inhaler frequency: has not used in long time Limitation of activity: no Productive cough: none Last Spirometry: April 2022 Pneumovax: Up to Date Influenza: Refuses   DIABETES Diagnosed at physical October 2021 was started on Metformin 500 MG BID.  Last A1c July was 6.8%.  Currently taking Metformin 1000 MG in morning and 500 MG in evening. Hypoglycemic episodes:no Polydipsia/polyuria: no Visual disturbance: no Chest pain: no Paresthesias: no Glucose Monitoring: yes             Accucheck frequency:  a few times a week             Fasting glucose: averages 147             Post prandial:             Evening:              Before meals:            Taking Insulin?: no             Long acting insulin:             Short acting insulin: Blood Pressure Monitoring: not checking Retinal Examination: Up to Date -- [employer], was told no retinopathy Foot Exam: Up to Date Diabetic Education: Not Completed Pneumovax: Up to Date Influenza: Refuses Aspirin: no    HYPERLIPIDEMIA Continues on  Atorvastatin 20 MG daily -- increased to 40 MG last visit, but is still taking 20 MG. Hyperlipidemia status: good compliance Satisfied with current treatment?  yes Side effects:  no Medication compliance: good compliance Past cholesterol meds: none Supplements: none Aspirin:  no The 10-year ASCVD risk score (Arnett DK, et al., 2019) is: 12.5%   Values used to calculate the score:     Age: 89 years     Sex: Male     Is Non-Hispanic African American: No     Diabetic: Yes     Tobacco smoker: No     Systolic Blood Pressure: 702 mmHg     Is BP treated: No     HDL Cholesterol: 57 mg/dL     Total Cholesterol: 214 mg/dL Chest pain:  no Coronary artery disease:  no Family history CAD:  no Family history early CAD:  no  Functional Status Survey: Is the patient deaf or have difficulty hearing?: No Does the patient have difficulty seeing, even when wearing glasses/contacts?: No Does the patient have difficulty concentrating, remembering, or making decisions?: No Does the patient have difficulty walking or climbing  stairs?: No Does the patient have difficulty dressing or bathing?: No Does the patient have difficulty doing errands alone such as visiting a doctor's office or shopping?: No  FALL RISK: Fall Risk  07/13/2021 06/02/2020 10/11/2016  Falls in the past year? 0 0 No  Number falls in past yr: 0 0 -  Injury with Fall? 0 0 -  Risk for fall due to : No Fall Risks No Fall Risks -  Follow up Falls prevention discussed Falls evaluation completed -    Depression Screen Depression screen [facility] 2/9 07/13/2021 06/02/2020 03/16/2018 10/11/2016  Decreased Interest 0 0 0 0  Down, Depressed, Hopeless 0 0 0 0  PHQ - 2 Score 0 0 0 0  Altered sleeping 0 - 1 -  Tired, decreased energy 0 - 1 -  Change in appetite 0 - 0 -  Feeling bad or failure about yourself  0 - 0 -  Trouble concentrating 0 - 0 -  Moving slowly or fidgety/restless 0 - 0 -  Suicidal thoughts 0 - - -  PHQ-9 Score 0 - 2 -   Difficult doing work/chores Not difficult at all - - -    Advanced Directives <no information>  Past Medical History:  Past Medical History:  Diagnosis Date   COPD (chronic obstructive pulmonary disease) (HCC)    Symbicort daily and Albuterol prn   Diabetes mellitus without complication (HCC)    History of bronchitis 6 months ago   Hyperlipidemia    takes Atorvastatin daily   Joint pain    Lung cancer ([place])    Sleep apnea    "mild" never needed CPAP    Surgical History:  Past Surgical History:  Procedure Laterality Date   BRONCHOSCOPY     ENDOBRONCHIAL ULTRASOUND N/A 01/02/2015   Procedure: ENDOBRONCHIAL ULTRASOUND;  Surgeon: Flora Lipps, MD;  Location: [facility];  Service: Cardiopulmonary;  Laterality: N/A;   MULTIPLE EXTRACTIONS WITH ALVEOLOPLASTY N/A 01/29/2015   Procedure: Extraction of tooth #'s 2,3,4,5,6,7,8,9,10,11,12,13,14,15,18,20,21,22,23, 25,26,27,28,29,30 with alveoloplasty and mandibular right lateral exostoses reductions;  Surgeon: Lenn Cal, DDS;  Location: [place];  Service: Oral Surgery;  Laterality: N/A;   THORACOTOMY/LOBECTOMY Left 02/17/2015   WITH LEFT UPPER LOBECTOMY WITH LYMPH NODE DISSECTION, OPEN CLOSURE OF LEFT UPPER LOBE BRONCHUS, INTERCOSTAL MUSCLE FLAP COVERAGE OF LEFT UPPER LOBE BRONCHIAL STUMP, PLACEMENT OF ON-Q (Left)   TONSILLECTOMY  1969   VASECTOMY  2001   VIDEO ASSISTED THORACOSCOPY (VATS)/ LOBECTOMY Left 02/17/2015   Procedure: THORACOTOMY WITH LEFT UPPER LOBECTOMY WITH LYMPH NODE DISSECTION, OPEN CLOSURE OF LEFT UPPER LOBE BRONCHUS, INTERCOSTAL MUSCLE FLAP COVERAGE OF LEFT UPPER LOBE BRONCHIAL STUMP, PLACEMENT OF ON-Q;  Surgeon: [name], MD;  Location: [place];  Service: Thoracic;  Laterality: Left;   VIDEO BRONCHOSCOPY  02/17/2015   VIDEO BRONCHOSCOPY N/A 02/17/2015   Procedure: VIDEO BRONCHOSCOPY;  Surgeon: [name], MD;  Location: [facility];  Service: Thoracic;  Laterality: N/A;   WISDOM TOOTH EXTRACTION      Medications:   Current Outpatient Medications on File Prior to Visit  Medication Sig   albuterol (PROVENTIL HFA;VENTOLIN HFA) 108 (90 Base) MCG/ACT inhaler Inhale 1-2 puffs into the lungs every 6 (six) hours as needed for wheezing or shortness of breath.   atorvastatin (LIPITOR) 40 MG tablet Take 1 tablet (40 mg total) by mouth daily. Stop 20 MG tablets.   Blood Glucose Monitoring Suppl (CONTOUR NEXT MONITOR) w/Device KIT To check blood sugar twice a day, once in morning fasting and once  2 hours after a meal -- goal blood sugars are less than 130 fasting and less than 180 two hours after eating.   budesonide-formoterol (SYMBICORT) 160-4.5 MCG/ACT inhaler Inhale 2 puffs into the lungs 2 (two) times daily.   Cholecalciferol (VITAMIN D) 2000 UNITS CAPS Take 1 capsule by mouth daily.   Comfort Lancets MISC To check blood sugar twice a day, once in morning fasting and once 2 hours after a meal -- goal blood sugars are less than 130 fasting and less than 180 two hours after eating.   glucose blood test strip To check blood sugar twice a day, once in morning fasting and once 2 hours after a meal -- goal blood sugars are less than 130 fasting and less than 180 two hours after eating.   Magnesium 200 MG TABS Take 1 tablet by mouth daily.   Multiple Vitamins-Minerals (AIRBORNE) CHEW Chew 2 tablets by mouth daily.   Potassium 95 MG TABS Take 95 mg by mouth daily.   No current facility-administered medications on file prior to visit.    Allergies:  No Known Allergies  Social History:  Social History   Socioeconomic History   Marital status: Divorced    Spouse name: Not on file   Number of children: 5   Years of education: Not on file   Highest education level: Not on file  Occupational History   Not on file  Tobacco Use   Smoking status: Former    Packs/day: 2.00    Years: 35.00    Pack years: 70.00    Types: Cigarettes    Quit date: 09/27/2013    Years since quitting: 7.7   Smokeless tobacco: Never    Tobacco comments:    quit smoking in 2015(cig) and cigars(01/29/15)  Vaping Use   Vaping Use: Never used  Substance and Sexual Activity   Alcohol use: Yes    Alcohol/week: 1.0 standard drink    Types: 1 Cans of beer per week    Comment: occasionally   Drug use: No   Sexual activity: Not Currently  Other Topics Concern   Not on file  Social History Narrative   Not on file   Social Determinants of Health   Financial Resource Strain: Not on file  Food Insecurity: Not on file  Transportation Needs: Not on file  Physical Activity: Not on file  Stress: Not on file  Social Connections: Not on file  Intimate Partner Violence: Not on file   Social History   Tobacco Use  Smoking Status Former   Packs/day: 2.00   Years: 35.00   Pack years: 70.00   Types: Cigarettes   Quit date: 09/27/2013   Years since quitting: 7.7  Smokeless Tobacco Never  Tobacco Comments   quit smoking in 2015(cig) and cigars(01/29/15)   Social History   Substance and Sexual Activity  Alcohol Use Yes   Alcohol/week: 1.0 standard drink   Types: 1 Cans of beer per week   Comment: occasionally    Family History:  Family History  Problem Relation Age of Onset   Leukemia Father    Alzheimer's disease Mother    Alcohol abuse Maternal Grandmother    Heart attack Maternal Grandfather    Alcohol abuse Paternal Grandfather     Past medical history, surgical history, medications, allergies, family history and social history reviewed with patient today and changes made to appropriate areas of the chart.   Review of Systems - negative All other ROS negative except what is listed  above and in the HPI.      Objective:    BP 124/74   Pulse 73   Temp 97.9 F (36.6 C) (Oral)   Ht 5' 9.75" (1.772 m)   Wt 212 lb 6.4 oz (96.3 kg)   BMI 30.70 kg/m   Wt Readings from Last 3 Encounters:  07/13/21 212 lb 6.4 oz (96.3 kg)  03/09/21 208 lb (94.3 kg)  12/01/20 207 lb 9.6 oz (94.2 kg)    Physical Exam Vitals  and nursing note reviewed.  Constitutional:      General: He is awake. He is not in acute distress.    Appearance: He is well-developed and well-groomed. He is not ill-appearing.  HENT:     Head: Normocephalic and atraumatic.     Right Ear: Hearing, tympanic membrane, ear canal and external ear normal. No drainage.     Left Ear: Hearing, tympanic membrane, ear canal and external ear normal. No drainage.     Nose: Nose normal.     Mouth/Throat:     Pharynx: Uvula midline.  Eyes:     General: Lids are normal.        Right eye: No discharge.        Left eye: No discharge.     Extraocular Movements: Extraocular movements intact.     Conjunctiva/sclera: Conjunctivae normal.     Pupils: Pupils are equal, round, and reactive to light.     Visual Fields: Right eye visual fields normal and left eye visual fields normal.  Neck:     Thyroid: No thyromegaly.     Vascular: No carotid bruit or JVD.     Trachea: Trachea normal.  Cardiovascular:     Rate and Rhythm: Normal rate and regular rhythm.     Heart sounds: Normal heart sounds, S1 normal and S2 normal. No murmur heard.   No gallop.  Pulmonary:     Effort: Pulmonary effort is normal. No accessory muscle usage or respiratory distress.     Breath sounds: Normal breath sounds.  Abdominal:     General: Bowel sounds are normal.     Palpations: Abdomen is soft. There is no hepatomegaly or splenomegaly.     Tenderness: There is no abdominal tenderness.  Musculoskeletal:        General: Normal range of motion.     Cervical back: Normal range of motion and neck supple.     Right lower leg: No edema.     Left lower leg: No edema.  Lymphadenopathy:     Head:     Right side of head: No submental, submandibular, tonsillar, preauricular or posterior auricular adenopathy.     Left side of head: No submental, submandibular, tonsillar, preauricular or posterior auricular adenopathy.     Cervical: No cervical adenopathy.  Skin:    General: Skin is  warm and dry.     Capillary Refill: Capillary refill takes less than 2 seconds.     Findings: No rash.  Neurological:     Mental Status: He is alert and oriented to person, place, and time.     Gait: Gait is intact.     Deep Tendon Reflexes: Reflexes are normal and symmetric.     Reflex Scores:      Brachioradialis reflexes are 2+ on the right side and 2+ on the left side.      Patellar reflexes are 2+ on the right side and 2+ on the left side. Psychiatric:  Attention and Perception: Attention normal.        Mood and Affect: Mood normal.        Speech: Speech normal.        Behavior: Behavior normal. Behavior is cooperative.        Thought Content: Thought content normal.        Cognition and Memory: Cognition normal.        Judgment: Judgment normal.   Results for orders placed or performed in visit on 03/09/21  Bayer DCA Hb A1c Waived  Result Value Ref Range   HB A1C (BAYER DCA - WAIVED) 6.8 <7.0 %  Comprehensive metabolic panel  Result Value Ref Range   Glucose 129 (H) 65 - 99 mg/dL   BUN 13 6 - 24 mg/dL   Creatinine, Ser 0.75 (L) 0.76 - 1.27 mg/dL   eGFR 105 >59 mL/min/1.73   BUN/Creatinine Ratio 17 9 - 20   Sodium 141 134 - 144 mmol/L   Potassium 5.2 3.5 - 5.2 mmol/L   Chloride 98 96 - 106 mmol/L   CO2 26 20 - 29 mmol/L   Calcium 9.4 8.7 - 10.2 mg/dL   Total Protein 6.4 6.0 - 8.5 g/dL   Albumin 4.3 3.8 - 4.9 g/dL   Globulin, Total 2.1 1.5 - 4.5 g/dL   Albumin/Globulin Ratio 2.0 1.2 - 2.2   Bilirubin Total 0.6 0.0 - 1.2 mg/dL   Alkaline Phosphatase 77 44 - 121 IU/L   AST 16 0 - 40 IU/L   ALT 27 0 - 44 IU/L  Lipid Panel w/o Chol/HDL Ratio  Result Value Ref Range   Cholesterol, Total 214 (H) 100 - 199 mg/dL   Triglycerides 162 (H) 0 - 149 mg/dL   HDL 57 >39 mg/dL   VLDL Cholesterol Cal 29 5 - 40 mg/dL   LDL Chol Calc (NIH) 128 (H) 0 - 99 mg/dL  Cologuard  Result Value Ref Range   Cologuard Negative Negative      Assessment & Plan:   Problem List Items  Addressed This Visit       Cardiovascular and Mediastinum   Aortic atherosclerosis ([place])    Noted on lung screenings, continue to monitor and continue daily statin.  Consider addition of ASA in future.      Coronary atherosclerosis    Noted on past lung screening, continue daily statin and consider addition of ASA in future.      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  Will continue Metformin 1000 MG in morning and 500 MG at night, he is tolerating this medication well.  Goal A1c <7% and educated him on sugar goals = fasting <130 and 2 hours after meal <180.  Recommend continued focus on diet.  Urine ALB 10 today, will add on ACE or ARB at very low dose in future, discussed with patient.  Return to office in 6 months.      Relevant Medications   metFORMIN (GLUCOPHAGE) 500 MG tablet   Other Relevant Orders   Bayer DCA Hb A1c Waived   Microalbumin, Urine Waived   TSH   Comprehensive metabolic panel     Respiratory   Centrilobular emphysema (HCC)    Chronic, well controlled.  Continue current medication regimen. Spirometry remains stable April 2022 and is tolerating inhalers.  Continue smoking cessation.  Discussed with patient in future could change Symbicort to LABA/LAMA like Anoro and educated him  on this.      Relevant Orders   CBC with Differential/Platelet     Endocrine   Hyperlipidemia associated with type 2 diabetes mellitus (HCC)    Chronic, ongoing.  Continue current medication regimen and adjust as needed.  CMP and lipid panel today.      Relevant Medications   metFORMIN (GLUCOPHAGE) 500 MG tablet   Other Relevant Orders   Bayer DCA Hb A1c Waived   Lipid Panel w/o Chol/HDL Ratio   Comprehensive metabolic panel     Other   History of lung cancer    Continue yearly lung screening and monitoring.  Continue smoking cessation.      History of smoking    Continue complete  cessation.      Other Visit Diagnoses     B12 deficiency       History of low levels, recheck level today and start supplement as needed.   Relevant Orders   Vitamin B12   Benign prostatic hyperplasia without lower urinary tract symptoms       PSA on labs today.   Relevant Orders   PSA   Hypomagnesemia       History of low levels, continues supplement, will check level today.   Relevant Orders   Magnesium   Vitamin D deficiency       History of low levels, recheck today and initiate supplement as needed.   Relevant Orders   VITAMIN D 25 Hydroxy (Vit-D Deficiency, Fractures)   Encounter for annual physical exam       Presents for annual physical today, labs obtained and health maintenance reviewed with patient -- refuses shingles and fluu vaccinations.       Discussed aspirin prophylaxis for myocardial infarction prevention and decision was it was not indicated  LABORATORY TESTING:  Health maintenance labs ordered today as discussed above.   The natural history of prostate cancer and ongoing controversy regarding screening and potential treatment outcomes of prostate cancer has been discussed with the patient. The meaning of a false positive PSA and a false negative PSA has been discussed. He indicates understanding of the limitations of this screening test and wishes to proceed with screening PSA testing.   IMMUNIZATIONS:   - Tdap: Tetanus vaccination status reviewed: last tetanus booster within 10 years. - Influenza: Refused - Pneumovax: Up to date in 2016 - Prevnar: Not applicable - Zostavax vaccine: Refused  SCREENING: - Colonoscopy: Cologuard due next 03/19/2024 Discussed with patient purpose of the colonoscopy is to detect colon cancer at curable precancerous or early stages   - AAA Screening: Not applicable  -Hearing Test: Not applicable  -Spirometry: Not applicable   PATIENT COUNSELING:    Sexuality: Discussed sexually transmitted diseases, partner selection,  use of condoms, avoidance of unintended pregnancy  and contraceptive alternatives.   Advised to avoid cigarette smoking.  I discussed with the patient that most people either abstain from alcohol or drink within safe limits (<=14/week and <=4 drinks/occasion for males, <=7/weeks and <= 3 drinks/occasion for females) and that the risk for alcohol disorders and other health effects rises proportionally with the number of drinks per week and how often a drinker exceeds daily limits.  Discussed cessation/primary prevention of drug use and availability of treatment for abuse.   Diet: Encouraged to adjust caloric intake to maintain  or achieve ideal body weight, to reduce intake of dietary saturated fat and total fat, to limit sodium intake by avoiding high sodium foods and not adding table salt, and  to maintain adequate dietary potassium and calcium preferably from fresh fruits, vegetables, and low-fat dairy products.    Stressed the importance of regular exercise  Injury prevention: Discussed safety belts, safety helmets, smoke detector, smoking near bedding or upholstery.   Dental health: Discussed importance of regular tooth brushing, flossing, and dental visits.   Follow up plan: NEXT PREVENTATIVE PHYSICAL DUE IN 1 YEAR. Return in about 6 months (around 01/10/2022) for T2DM, HTN/HLD, COPD.

## 2021-07-13 NOTE — Assessment & Plan Note (Signed)
Chronic, ongoing.  Continue current medication regimen and adjust as needed.  CMP and lipid panel today.

## 2021-07-14 ENCOUNTER — Encounter: Payer: Self-pay | Admitting: Nurse Practitioner

## 2021-07-14 ENCOUNTER — Other Ambulatory Visit: Payer: Self-pay | Admitting: *Deleted

## 2021-07-14 DIAGNOSIS — Z87891 Personal history of nicotine dependence: Secondary | ICD-10-CM

## 2021-07-14 DIAGNOSIS — E538 Deficiency of other specified B group vitamins: Secondary | ICD-10-CM | POA: Insufficient documentation

## 2021-07-14 DIAGNOSIS — D519 Vitamin B12 deficiency anemia, unspecified: Secondary | ICD-10-CM | POA: Insufficient documentation

## 2021-07-14 LAB — LIPID PANEL W/O CHOL/HDL RATIO
Cholesterol, Total: 204 mg/dL — ABNORMAL HIGH (ref 100–199)
HDL: 57 mg/dL (ref >39)
LDL Chol Calc (NIH): 131 mg/dL — ABNORMAL HIGH (ref 0–99)
Triglycerides: 92 mg/dL (ref 0–149)
VLDL Cholesterol Cal: 16 mg/dL (ref 5–40)

## 2021-07-14 LAB — CBC WITH DIFFERENTIAL/PLATELET
Basophils Absolute: 0 10*3/uL (ref 0.0–0.2)
Basos: 1 %
EOS (ABSOLUTE): 0.1 10*3/uL (ref 0.0–0.4)
Eos: 1 %
Hematocrit: 44.3 % (ref 37.5–51.0)
Hemoglobin: 14.9 g/dL (ref 13.0–17.7)
Immature Grans (Abs): 0 10*3/uL (ref 0.0–0.1)
Immature Granulocytes: 0 %
Lymphocytes Absolute: 1.6 10*3/uL (ref 0.7–3.1)
Lymphs: 24 %
MCH: 29 pg (ref 26.6–33.0)
MCHC: 33.6 g/dL (ref 31.5–35.7)
MCV: 86 fL (ref 79–97)
Monocytes Absolute: 0.6 10*3/uL (ref 0.1–0.9)
Monocytes: 9 %
Neutrophils Absolute: 4.6 10*3/uL (ref 1.4–7.0)
Neutrophils: 65 %
Platelets: 202 10*3/uL (ref 150–450)
RBC: 5.13 x10E6/uL (ref 4.14–5.80)
RDW: 12.5 % (ref 11.6–15.4)
WBC: 6.9 10*3/uL (ref 3.4–10.8)

## 2021-07-14 LAB — COMPREHENSIVE METABOLIC PANEL
ALT: 27 IU/L (ref 0–44)
AST: 17 IU/L (ref 0–40)
Albumin/Globulin Ratio: 2.5 — ABNORMAL HIGH (ref 1.2–2.2)
Albumin: 4.5 g/dL (ref 3.8–4.9)
Alkaline Phosphatase: 80 IU/L (ref 44–121)
BUN/Creatinine Ratio: 17 (ref 9–20)
BUN: 14 mg/dL (ref 6–24)
Bilirubin Total: 0.7 mg/dL (ref 0.0–1.2)
CO2: 26 mmol/L (ref 20–29)
Calcium: 9.2 mg/dL (ref 8.7–10.2)
Chloride: 97 mmol/L (ref 96–106)
Creatinine, Ser: 0.84 mg/dL (ref 0.76–1.27)
Globulin, Total: 1.8 g/dL (ref 1.5–4.5)
Glucose: 135 mg/dL — ABNORMAL HIGH (ref 70–99)
Potassium: 4.8 mmol/L (ref 3.5–5.2)
Sodium: 137 mmol/L (ref 134–144)
Total Protein: 6.3 g/dL (ref 6.0–8.5)
eGFR: 101 mL/min/{1.73_m2} (ref >59)

## 2021-07-14 LAB — VITAMIN B12: Vitamin B-12: 277 pg/mL (ref 232–1245)

## 2021-07-14 LAB — VITAMIN D 25 HYDROXY (VIT D DEFICIENCY, FRACTURES): Vit D, 25-Hydroxy: 30.3 ng/mL (ref 30.0–100.0)

## 2021-07-14 LAB — MAGNESIUM: Magnesium: 2.1 mg/dL (ref 1.6–2.3)

## 2021-07-14 LAB — PSA: Prostate Specific Ag, Serum: 0.9 ng/mL (ref 0.0–4.0)

## 2021-07-14 LAB — TSH: TSH: 0.931 u[IU]/mL (ref 0.450–4.500)

## 2021-07-14 MED ORDER — ROSUVASTATIN CALCIUM 40 MG PO TABS: 40.0000 mg | ORAL_TABLET | Freq: Every day | ORAL | 4 refills | Status: DC

## 2021-07-14 NOTE — Addendum Note (Signed)
Addended by: Marnee Guarneri T on: 07/14/2021 01:46 PM   Modules accepted: Orders

## 2021-07-14 NOTE — Progress Notes (Signed)
Contacted via [place] afternoon Pilar Plate, your labs have returned and overall look great with a couple exceptions: - Cholesterol levels remain elevated above goal, would like to see that LDL < 70 for stroke prevention, current is 131.  I recommend we stop your Atorvastatin and change to Rosuvastatin 40 MG daily, which is a little stronger.  I will send this in and stop the Atorvastatin, start the new medication. - Vitamin B12 level is on lower side, I would recommend you start taking Vitamin B12 1000 MCG daily which is good for nervous system health, including memory.  Long term Metformin use can sometimes lower this in your system.  You can obtain this in the vitamin section at any location.  Any questions?  Happy Holidays!! Keep being awesome!!  Thank you for allowing me to participate in your care.  I appreciate you. Kindest regards, Harveen Flesch

## 2021-07-29 ENCOUNTER — Other Ambulatory Visit: Payer: Self-pay

## 2021-07-29 ENCOUNTER — Ambulatory Visit
Admission: RE | Admit: 2021-07-29 | Discharge: 2021-07-29 | Disposition: A | Discharge status: Home / Self Care | Payer: BC Managed Care – PPO | Source: Ambulatory Visit | Attending: Acute Care | Admitting: Acute Care

## 2021-07-29 DIAGNOSIS — Z87891 Personal history of nicotine dependence: Secondary | ICD-10-CM | POA: Diagnosis not present

## 2021-07-31 ENCOUNTER — Other Ambulatory Visit: Payer: Self-pay | Admitting: Acute Care

## 2021-07-31 ENCOUNTER — Ambulatory Visit: Admission: RE | Admit: 2021-07-31 | Payer: BC Managed Care – PPO | Source: Ambulatory Visit

## 2021-07-31 DIAGNOSIS — Z87891 Personal history of nicotine dependence: Secondary | ICD-10-CM

## 2021-12-26 DIAGNOSIS — E119 Type 2 diabetes mellitus without complications: Secondary | ICD-10-CM | POA: Diagnosis not present

## 2021-12-26 DIAGNOSIS — H02831 Dermatochalasis of right upper eyelid: Secondary | ICD-10-CM | POA: Diagnosis not present

## 2021-12-26 DIAGNOSIS — H02834 Dermatochalasis of left upper eyelid: Secondary | ICD-10-CM | POA: Diagnosis not present

## 2021-12-26 DIAGNOSIS — H524 Presbyopia: Secondary | ICD-10-CM | POA: Diagnosis not present

## 2021-12-26 LAB — HM DIABETES EYE EXAM

## 2021-12-29 ENCOUNTER — Other Ambulatory Visit: Payer: Self-pay | Admitting: Nurse Practitioner

## 2021-12-30 NOTE — Telephone Encounter (Signed)
Requested Prescriptions  ?Pending Prescriptions Disp Refills  ?? SYMBICORT 160-4.5 MCG/ACT inhaler [Pharmacy Med Name: Symbicort 160-4.5 MCG/ACT Inhalation Aerosol] 11 g 0  ?  Sig: Inhale 2 puffs by mouth twice daily  ?  ? Pulmonology:  Combination Products Passed - 12/29/2021  6:07 AM  ?  ?  Passed - Valid encounter within last 12 months  ?  Recent Outpatient Visits   ?      ? 5 months ago Type 2 diabetes mellitus with cardiac complication ([place])  ? [place], [place] T, NP  ? 9 months ago Type 2 diabetes mellitus with cardiac complication ([place])  ? [place], [place] T, NP  ? 1 year ago Type 2 diabetes mellitus with cardiac complication ([place])  ? [place], Henrine Screws T, NP  ? 1 year ago Routine physical examination  ? [place], Carmen T, NP  ? 2 years ago Chronic obstructive pulmonary disease, unspecified COPD type ([place])  ? [facility] [place], Henrine Screws T, NP  ?  ?  ?Future Appointments   ?        ? In 1 week [place], Barbaraann Faster, NP [employer]  ?  ? ?  ?  ?  ? ? ?

## 2021-12-31 ENCOUNTER — Telehealth: Payer: BC Managed Care – PPO | Admitting: Nurse Practitioner

## 2021-12-31 NOTE — Telephone Encounter (Signed)
Spoke with patient and let him know the estimated cost of CPE. It is $122. Let him know I would send this message back to our clinic staff to address his medication ?

## 2021-12-31 NOTE — Telephone Encounter (Signed)
Pt stated his medication went from $35.00 to $110.00 medication SYMBICORT 160-4.5 MCG/ACT inhaler ?Pt stated he is not paying for that and that nothing has changed in regards to insurance. Pt seeking clinical advice on what to do in this case. ? ?Pt would also like to know how much an annual exam costs without insurance. ? ?Pt is requesting a call back.  ?

## 2022-01-01 NOTE — Telephone Encounter (Signed)
Patient states he no longer has any insurance coverage, he would like to discuss other medication options at upcoming office visit.  ?

## 2022-01-10 NOTE — Patient Instructions (Signed)

## 2022-01-11 ENCOUNTER — Telehealth: Payer: Self-pay | Admitting: Nurse Practitioner

## 2022-01-11 ENCOUNTER — Ambulatory Visit (INDEPENDENT_AMBULATORY_CARE_PROVIDER_SITE_OTHER): Payer: Self-pay | Admitting: Nurse Practitioner

## 2022-01-11 ENCOUNTER — Encounter: Payer: Self-pay | Admitting: Nurse Practitioner

## 2022-01-11 VITALS — BP 128/76 | HR 73 | Temp 98.2°F | Ht 70.5 in | Wt 207.0 lb

## 2022-01-11 DIAGNOSIS — E1159 Type 2 diabetes mellitus with other circulatory complications: Secondary | ICD-10-CM

## 2022-01-11 DIAGNOSIS — I700 Atherosclerosis of aorta: Secondary | ICD-10-CM

## 2022-01-11 DIAGNOSIS — Z85118 Personal history of other malignant neoplasm of bronchus and lung: Secondary | ICD-10-CM

## 2022-01-11 DIAGNOSIS — D518 Other vitamin B12 deficiency anemias: Secondary | ICD-10-CM

## 2022-01-11 DIAGNOSIS — E1169 Type 2 diabetes mellitus with other specified complication: Secondary | ICD-10-CM

## 2022-01-11 DIAGNOSIS — E785 Hyperlipidemia, unspecified: Secondary | ICD-10-CM

## 2022-01-11 DIAGNOSIS — I2583 Coronary atherosclerosis due to lipid rich plaque: Secondary | ICD-10-CM

## 2022-01-11 DIAGNOSIS — Z87891 Personal history of nicotine dependence: Secondary | ICD-10-CM

## 2022-01-11 DIAGNOSIS — J432 Centrilobular emphysema: Secondary | ICD-10-CM

## 2022-01-11 DIAGNOSIS — I2510 Atherosclerotic heart disease of native coronary artery without angina pectoris: Secondary | ICD-10-CM

## 2022-01-11 LAB — MICROALBUMIN, URINE WAIVED
Creatinine, Urine Waived: 300 mg/dL (ref 10–300)
Microalb, Ur Waived: 150 mg/L — ABNORMAL HIGH (ref 0–19)

## 2022-01-11 LAB — BAYER DCA HB A1C WAIVED: HB A1C (BAYER DCA - WAIVED): 6.9 % — ABNORMAL HIGH (ref 4.8–5.6)

## 2022-01-11 NOTE — Assessment & Plan Note (Signed)
Chronic, ongoing -- recommend he take supplement daily and adjust as needed.  Is on long term Metformin.

## 2022-01-11 NOTE — Assessment & Plan Note (Signed)
Noted on lung screenings, continue to monitor and continue daily statin.  Consider addition of ASA in future.

## 2022-01-11 NOTE — Assessment & Plan Note (Signed)
Ongoing, diagnosed October 2021 -- A1c today 6.9%, stable on current regimen.  Will continue Metformin 1000 MG in morning and 500 MG at night, he is tolerating this medication well.  Goal A1c <7% and educated him on sugar goals = fasting <130 and 2 hours after meal <180.  Recommend continued focus on diet.  Urine ALB 150 today, will add on ACE or ARB at very low dose in future, discussed with patient -- he wishes to hold off and recheck at physical. Return to office in 6 months.

## 2022-01-11 NOTE — Progress Notes (Signed)
BP 128/76   Pulse 73   Temp 98.2 F (36.8 C) (Oral)   Ht 5' 10.5" (1.791 m)   Wt 207 lb (93.9 kg)   SpO2 96%   BMI 29.28 kg/m    Subjective:    Patient ID: Timothy Benitez, male    DOB: 1960/09/02, 61 y.o.   MRN: [id number]  HPI: Timothy Benitez is a 61 y.o. male  Chief Complaint  Patient presents with   Diabetes    Patient recent Diabetic Eye Exam was requested at today's visit.    Hyperlipidemia   Hypertension   COPD   Medication Problem    Patient states his Symbicort prescription is extremely expensive since he no longer insurance and he would like to discuss cheaper treatment options.    DIABETES Last A1c in November was 6.8% and Metformin 1000 MG morning and 500 MG evening. Hypoglycemic episodes:no Polydipsia/polyuria: no Visual disturbance: no Chest pain: no Paresthesias: no Glucose Monitoring: yes  Accucheck frequency:  rarely  Fasting glucose: 130-135 in the morning  Post prandial:  Evening:  Before meals: Taking Insulin?: no  Long acting insulin:  Short acting insulin: Blood Pressure Monitoring: not checking Retinal Examination: Up to Date -- 2 weekends ago [employer] Exam: Up to Date Pneumovax: Up to Date Influenza: Up to Date Aspirin: no   HYPERTENSION / HYPERLIPIDEMIA Continues on Rosuvastatin. Satisfied with current treatment? yes Duration of hypertension: chronic BP monitoring frequency: a few times a week BP range: 110-120/70-80 BP medication side effects: no Duration of hyperlipidemia: chronic Cholesterol medication side effects: no Cholesterol supplements: none Past cholesterol medications:  Medication compliance: good compliance Aspirin: no Recent stressors: no Recurrent headaches: no Visual changes: no Palpitations: no Dyspnea: no Chest pain: no Lower extremity edema: no Dizzy/lightheaded: no   COPD Is not taking Symbicort and Albuterol -- due to cost.  Currently no insurance, as he was paying $5000 a year for  insurance and $$ out of pocket for medications.  Quit smoking cigarettes in 2015 and cigars in 2016 -- history of lung cancer. COPD status: stable Satisfied with current treatment?: yes Oxygen use: no Dyspnea frequency: none Cough frequency: a little in morning Rescue inhaler frequency:  none Limitation of activity: no Productive cough: none Last Spirometry: 12/01/20 Pneumovax: Up to Date Influenza: Up to Date   Relevant past medical, surgical, family and social history reviewed and updated as indicated. Interim medical history since our last visit reviewed. Allergies and medications reviewed and updated.  Review of Systems  Constitutional:  Negative for activity change, diaphoresis, fatigue and fever.  Respiratory:  Negative for cough, chest tightness, shortness of breath and wheezing.   Cardiovascular:  Negative for chest pain, palpitations and leg swelling.  Gastrointestinal: Negative.   Endocrine: Negative.   Neurological: Negative.   Psychiatric/Behavioral: Negative.     Per HPI unless specifically indicated above     Objective:    BP 128/76   Pulse 73   Temp 98.2 F (36.8 C) (Oral)   Ht 5' 10.5" (1.791 m)   Wt 207 lb (93.9 kg)   SpO2 96%   BMI 29.28 kg/m   Wt Readings from Last 3 Encounters:  01/11/22 207 lb (93.9 kg)  07/29/21 205 lb (93 kg)  07/13/21 212 lb 6.4 oz (96.3 kg)    Physical Exam Vitals and nursing note reviewed.  Constitutional:      General: He is awake. He is not in acute distress.    Appearance: He is well-developed and  well-groomed. He is not ill-appearing.  HENT:     Head: Normocephalic and atraumatic.     Right Ear: Hearing normal. No drainage.     Left Ear: Hearing normal. No drainage.  Eyes:     General: Lids are normal.        Right eye: No discharge.        Left eye: No discharge.     Conjunctiva/sclera: Conjunctivae normal.     Pupils: Pupils are equal, round, and reactive to light.  Neck:     Thyroid: No thyromegaly.      Vascular: No carotid bruit.     Trachea: Trachea normal.  Cardiovascular:     Rate and Rhythm: Normal rate and regular rhythm.     Heart sounds: Normal heart sounds, S1 normal and S2 normal. No murmur heard.   No gallop.  Pulmonary:     Effort: Pulmonary effort is normal. No accessory muscle usage or respiratory distress.     Breath sounds: Normal breath sounds.  Abdominal:     General: Bowel sounds are normal.     Palpations: Abdomen is soft.  Musculoskeletal:        General: Normal range of motion.     Cervical back: Normal range of motion and neck supple.     Right lower leg: No edema.     Left lower leg: No edema.  Lymphadenopathy:     Cervical: No cervical adenopathy.  Skin:    General: Skin is warm and dry.     Capillary Refill: Capillary refill takes less than 2 seconds.  Neurological:     Mental Status: He is alert and oriented to person, place, and time.     Deep Tendon Reflexes: Reflexes are normal and symmetric.  Psychiatric:        Attention and Perception: Attention normal.        Mood and Affect: Mood normal.        Speech: Speech normal.        Behavior: Behavior normal. Behavior is cooperative.        Thought Content: Thought content normal.    Results for orders placed or performed in visit on 01/11/22  Bayer DCA Hb A1c Waived  Result Value Ref Range   HB A1C (BAYER DCA - WAIVED) 6.9 (H) 4.8 - 5.6 %  Microalbumin, Urine Waived  Result Value Ref Range   Microalb, Ur Waived 150 (H) 0 - 19 mg/L   Creatinine, Urine Waived 300 10 - 300 mg/dL   Microalb/Creat Ratio 30-300 (H) <30 mg/g      Assessment & Plan:   Problem List Items Addressed This Visit       Cardiovascular and Mediastinum   Aortic atherosclerosis ([place])    Noted on lung screenings, continue to monitor and continue daily statin.  Consider addition of ASA in future.       Relevant Orders   Basic metabolic panel   Lipid Panel w/o Chol/HDL Ratio   Coronary atherosclerosis    Noted on past  lung screening, continue daily statin and consider addition of ASA in future.       Relevant Orders   Basic metabolic panel   Lipid Panel w/o Chol/HDL Ratio   Type 2 diabetes mellitus with cardiac complication [facility]) - Primary    Ongoing, diagnosed October 2021 -- A1c today 6.9%, stable on current regimen.  Will continue Metformin 1000 MG in morning and 500 MG at night, he is tolerating this medication well.  Goal A1c <7% and educated him on sugar goals = fasting <130 and 2 hours after meal <180.  Recommend continued focus on diet.  Urine ALB 150 today, will add on ACE or ARB at very low dose in future, discussed with patient -- he wishes to hold off and recheck at physical. Return to office in 6 months.       Relevant Orders   Bayer DCA Hb A1c Waived (Completed)   Basic metabolic panel   Microalbumin, Urine Waived (Completed)     Respiratory   Centrilobular emphysema (HCC)    Chronic, ongoing.  Spirometry stable April 2022 and is tolerating inhalers, but at this time can not afford inhalers as is self pay.  Will look into medication management clinic, provided pamphlet.  Continue smoking cessation.  Discussed with patient in future could change Symbicort to LABA/LAMA like Anoro and educated him on this.         Endocrine   Hyperlipidemia associated with type 2 diabetes mellitus (HCC)    Chronic, ongoing.  Continue current medication regimen and adjust as needed.  BMP and lipid panel today.       Relevant Orders   Bayer DCA Hb A1c Waived (Completed)     Other   B12 deficiency anemia    Chronic, ongoing -- recommend he take supplement daily and adjust as needed.  Is on long term Metformin.       Relevant Orders   Vitamin B12   History of lung cancer    Stable.  Continue yearly lung screening and monitoring.  Continue smoking cessation.       History of smoking    Continue complete cessation.  Will continue annual lung screening until age 85.         Follow up  plan: Return in about 6 months (around 07/14/2022) for Annual physical with diabetes check and spirometry.

## 2022-01-11 NOTE — Assessment & Plan Note (Signed)
Chronic, ongoing.  Continue current medication regimen and adjust as needed.  BMP and lipid panel today.

## 2022-01-11 NOTE — Assessment & Plan Note (Addendum)
Stable.  Continue yearly lung screening and monitoring.  Continue smoking cessation.

## 2022-01-11 NOTE — Assessment & Plan Note (Signed)
Noted on past lung screening, continue daily statin and consider addition of ASA in future.

## 2022-01-11 NOTE — Telephone Encounter (Signed)
Copied from [place] [id number]. Topic: General - Other >> Jan 11, 2022  9:44 AM Bayard Beaver wrote: Reason for CRM:pt called in to schedule time for medication management for all of his medicine. I also updated the pharmacy he wants to use to, [facility]  Phone:  [phone] Fax:  [phone]

## 2022-01-11 NOTE — Assessment & Plan Note (Addendum)
Chronic, ongoing.  Spirometry stable April 2022 and is tolerating inhalers, but at this time can not afford inhalers as is self pay.  Will look into medication management clinic, provided pamphlet.  Continue smoking cessation.  Discussed with patient in future could change Symbicort to LABA/LAMA like Anoro and educated him on this.

## 2022-01-11 NOTE — Assessment & Plan Note (Signed)
Continue complete cessation.  Will continue annual lung screening until age 61.

## 2022-01-12 LAB — BASIC METABOLIC PANEL
BUN/Creatinine Ratio: 20 (ref 9–20)
BUN: 18 mg/dL (ref 6–24)
CO2: 25 mmol/L (ref 20–29)
Calcium: 9.5 mg/dL (ref 8.7–10.2)
Chloride: 98 mmol/L (ref 96–106)
Creatinine, Ser: 0.89 mg/dL (ref 0.76–1.27)
Glucose: 147 mg/dL — ABNORMAL HIGH (ref 70–99)
Potassium: 5.1 mmol/L (ref 3.5–5.2)
Sodium: 139 mmol/L (ref 134–144)
eGFR: 99 mL/min/{1.73_m2} (ref >59)

## 2022-01-12 LAB — LIPID PANEL W/O CHOL/HDL RATIO
Cholesterol, Total: 171 mg/dL (ref 100–199)
HDL: 49 mg/dL (ref >39)
LDL Chol Calc (NIH): 91 mg/dL (ref 0–99)
Triglycerides: 182 mg/dL — ABNORMAL HIGH (ref 0–149)
VLDL Cholesterol Cal: 31 mg/dL (ref 5–40)

## 2022-01-12 LAB — VITAMIN B12: Vitamin B-12: 810 pg/mL (ref 232–1245)

## 2022-01-12 NOTE — Progress Notes (Signed)
Contacted via [place] evening Pilar Plate, your labs have returned.  Kidney function, creatinine and eGFR, remains normal.  Cholesterol levels improved this check, LDL coming down.  Continue Rosuvastatin daily.  Vitamin B12 level improving, continue supplement daily since you are on Metformin which can lower B12.  Any questions? Keep being amazing!!  Thank you for allowing me to participate in your care.  I appreciate you. Kindest regards, Richel Millspaugh

## 2022-02-21 ENCOUNTER — Other Ambulatory Visit: Payer: Self-pay | Admitting: Nurse Practitioner

## 2022-02-22 ENCOUNTER — Encounter: Payer: Self-pay | Admitting: Nurse Practitioner

## 2022-02-22 ENCOUNTER — Other Ambulatory Visit: Payer: Self-pay

## 2022-02-22 MED ORDER — SYMBICORT 160-4.5 MCG/ACT IN AERO
INHALATION_SPRAY | RESPIRATORY_TRACT | 0 refills | Status: DC
  Filled 2022-02-22: qty 10.2, 30d supply, fill #0

## 2022-02-22 NOTE — Telephone Encounter (Signed)
Requested Prescriptions  Pending Prescriptions Disp Refills  . SYMBICORT 160-4.5 MCG/ACT inhaler [Pharmacy Med Name: Symbicort 160-4.5 MCG/ACT Inhalation Aerosol] 11 g 0    Sig: Inhale 2 puffs by mouth twice daily     Pulmonology:  Combination Products Passed - 02/21/2022  8:27 AM      Passed - Valid encounter within last 12 months    Recent Outpatient Visits          1 month ago Type 2 diabetes mellitus with cardiac complication ([place])   [place], Jolene T, NP   7 months ago Type 2 diabetes mellitus with cardiac complication ([place])   [place], Jolene T, NP   11 months ago Type 2 diabetes mellitus with cardiac complication ([place])   [place] Cannady, Jolene T, NP   1 year ago Type 2 diabetes mellitus with cardiac complication ([place])   [place] Cannady, Barbaraann Faster, NP   1 year ago Routine physical examination   [place], Barbaraann Faster, NP      Future Appointments            In 4 months Cannady, Barbaraann Faster, NP [employer] [facility]

## 2022-02-25 ENCOUNTER — Other Ambulatory Visit: Payer: Self-pay

## 2022-03-25 ENCOUNTER — Other Ambulatory Visit: Payer: Self-pay | Admitting: Pharmacy Technician

## 2022-03-25 NOTE — Patient Outreach (Signed)
Received signed portion of PAP application from provider, Marnee Guarneri.  Contacted patient and made aware that I needed him to provide his portion of the PAP application along with financial information.  Patient indicated that he will return that information on 03/29/22.  Also, made patient aware that medication cannot be ordered until he provides requested information.  Patient verbally acknowledged that he understood.  Jacquelynn Cree Patient Advocate Specialist [place]

## 2022-04-01 ENCOUNTER — Other Ambulatory Visit: Payer: Self-pay

## 2022-04-19 ENCOUNTER — Other Ambulatory Visit: Payer: Self-pay | Admitting: Pharmacy Technician

## 2022-04-19 ENCOUNTER — Other Ambulatory Visit: Payer: Self-pay | Admitting: Nurse Practitioner

## 2022-04-19 ENCOUNTER — Other Ambulatory Visit: Payer: Self-pay

## 2022-04-19 NOTE — Patient Outreach (Signed)
Patient denied for medication assistance with BREO due to household income exceeds income threshold for [place].  Symbicort, Advair and Dulera have been removed from the pharmaceutical companies's formulary.  Therefore, unable to obtain them.  Patient's household income exceeds income limit for [place].  Therefore, unable to obtain Wixela.  Made patient aware of coupon for Symbicort using GoodRX and coupon for Wixela using [employer].  Patient to work with provider, Marnee Guarneri, NP to find out the best inhaler obtain for his budget.  Jacquelynn Cree Patient [place] at [facility]

## 2022-04-20 MED ORDER — SYMBICORT 160-4.5 MCG/ACT IN AERO: 2.0000 | INHALATION_SPRAY | Freq: Two times a day (BID) | RESPIRATORY_TRACT | 4 refills | Status: DC

## 2022-04-29 ENCOUNTER — Telehealth: Payer: Self-pay | Admitting: Nurse Practitioner

## 2022-04-29 NOTE — Telephone Encounter (Signed)
Pt requests call back regarding approval for SYMBICORT 160-4.5 MCG/ACT inhaler. Pt stated he has been trying for 3 days to get the cost of this Rx at a lower cost. Cb# [phone]

## 2022-04-30 NOTE — Telephone Encounter (Signed)
Patient was notified of Jolene's recommendations. Patient says he was told that even with GoodRx coupon somehow it was still $400 plus dollars for one month. Patient says he is was speaking with a pharmacy representative at [facility] and was told that they were unable to get Symbicort inhaler. Patient says he cannot afford that expense for just a month supply. Patient says he has ran out and been out since his appointment in May. Patient says he would really love to have his medication somehow as he noticed a difference. Please advise?

## 2022-05-07 ENCOUNTER — Telehealth: Payer: Self-pay | Admitting: Nurse Practitioner

## 2022-05-07 MED ORDER — SYMBICORT 160-4.5 MCG/ACT IN AERO: 2.0000 | INHALATION_SPRAY | Freq: Two times a day (BID) | RESPIRATORY_TRACT | 4 refills | Status: DC

## 2022-05-07 NOTE — Telephone Encounter (Signed)
Medication Refill - Medication: SYMBICORT 160-4.5 MCG/ACT inhaler  Has the patient contacted their pharmacy? No  Preferred Pharmacy (with phone number or street name):  [facility] [address], [place], [place] [address]    [phone]   Has the patient been seen for an appointment in the last year OR does the patient have an upcoming appointment? Yes.    Agent: Please be advised that RX refills may take up to 3 business days. We ask that you follow-up with your pharmacy.

## 2022-05-10 NOTE — Telephone Encounter (Signed)
dose dc'd 05/07/22 Dc'd by J. Cannady NP  Requested Prescriptions  Refused Prescriptions Disp Refills  . SYMBICORT 160-4.5 MCG/ACT inhaler 10.2 g 0    Sig: Inhale 2 puffs by mouth twice daily     Pulmonology:  Combination Products Passed - 05/07/2022  3:31 PM      Passed - Valid encounter within last 12 months    Recent Outpatient Visits          3 months ago Type 2 diabetes mellitus with cardiac complication ([place])   [place] Cannady, Jolene T, NP   10 months ago Type 2 diabetes mellitus with cardiac complication ([place])   [place] Cannady, Jolene T, NP   1 year ago Type 2 diabetes mellitus with cardiac complication ([place])   [place] Cannady, Jolene T, NP   1 year ago Type 2 diabetes mellitus with cardiac complication ([place])   [place] Cannady, Barbaraann Faster, NP   1 year ago Routine physical examination   [place], Barbaraann Faster, NP      Future Appointments            In 2 months Cannady, Barbaraann Faster, NP [employer] [facility]

## 2022-05-18 NOTE — Telephone Encounter (Signed)
Spoke with patient and made him aware of Jolene's and Nathan's recommendations. Patient says he was denied for PAP as he was informed that his income was too high. Patient then says that he has not insurance cover at all, so he would not have anyone to call in regards of finding alternative inhalers. Patient says Ovid Curd if you try and call him, please leave him a message as he tends to not answer numbers he is not aware of. Please advise?

## 2022-05-25 ENCOUNTER — Telehealth: Payer: Self-pay

## 2022-05-25 NOTE — Telephone Encounter (Signed)
Still unable to contact patient.

## 2022-05-26 MED ORDER — BUDESONIDE-FORMOTEROL FUMARATE 160-4.5 MCG/ACT IN AERO: 2.0000 | INHALATION_SPRAY | Freq: Two times a day (BID) | RESPIRATORY_TRACT | 6 refills | Status: DC

## 2022-05-26 NOTE — Telephone Encounter (Signed)
Pt stated that a request was sent from the pharmacy to replace the [place] with a generic for it  2 weeks ago / pt called to ask why the generic was not sent / Please advise and [facility] [address] [place],  [address]

## 2022-07-19 ENCOUNTER — Other Ambulatory Visit: Payer: Self-pay | Admitting: Nurse Practitioner

## 2022-07-19 ENCOUNTER — Ambulatory Visit: Payer: BC Managed Care – PPO | Admitting: Nurse Practitioner

## 2022-07-20 NOTE — Telephone Encounter (Signed)
Expired Rx- appointment 12/7( original Rx #[id number] RF) Requested Prescriptions  Pending Prescriptions Disp Refills   rosuvastatin (CRESTOR) 40 MG tablet [Pharmacy Med Name: Rosuvastatin Calcium 40 MG Oral Tablet] 90 tablet 0    Sig: Take 1 tablet by mouth once daily     Cardiovascular:  Antilipid - Statins 2 Failed - 07/19/2022  7:42 PM      Failed - Lipid Panel in normal range within the last 12 months    Cholesterol, Total  Date Value Ref Range Status  01/11/2022 171 100 - 199 mg/dL Final   LDL Chol Calc (NIH)  Date Value Ref Range Status  01/11/2022 91 0 - 99 mg/dL Final   HDL  Date Value Ref Range Status  01/11/2022 49 >39 mg/dL Final   Triglycerides  Date Value Ref Range Status  01/11/2022 182 (H) 0 - 149 mg/dL Final         Passed - Cr in normal range and within 360 days    Creatinine, Ser  Date Value Ref Range Status  01/11/2022 0.89 0.76 - 1.27 mg/dL Final         Passed - Patient is not pregnant      Passed - Valid encounter within last 12 months    Recent Outpatient Visits           6 months ago Type 2 diabetes mellitus with cardiac complication ([place])   [place], Jolene T, NP   1 year ago Type 2 diabetes mellitus with cardiac complication ([place])   [place], Jolene T, NP   1 year ago Type 2 diabetes mellitus with cardiac complication ([place])   [place], Jolene T, NP   1 year ago Type 2 diabetes mellitus with cardiac complication ([place])   [place] Cannady, Henrine Screws T, NP   2 years ago Routine physical examination   [place], Barbaraann Faster, NP       Future Appointments             In 1 week Cannady, Barbaraann Faster, NP [employer] [facility]

## 2022-07-21 ENCOUNTER — Other Ambulatory Visit: Payer: Self-pay

## 2022-07-21 MED ORDER — METFORMIN HCL 500 MG PO TABS
ORAL_TABLET | ORAL | 4 refills | Status: DC
  Filled 2022-07-21: qty 270, 90d supply, fill #0

## 2022-07-21 NOTE — Telephone Encounter (Signed)
Medication refill for metformin 500 mg last ov 01/11/22, upcoming ov 07/29/22 . Please advise

## 2022-07-25 NOTE — Patient Instructions (Signed)
Diabetes Mellitus Basics  Diabetes mellitus, or diabetes, is a long-term (chronic) disease. It occurs when the body does not properly use sugar (glucose) that is released from food after you eat. Diabetes mellitus may be caused by one or both of these problems: Your pancreas does not make enough of a hormone called insulin. Your body does not react in a normal way to the insulin that it makes. Insulin lets glucose enter cells in your body. This gives you energy. If you have diabetes, glucose cannot get into cells. This causes high blood glucose (hyperglycemia). How to treat and manage diabetes You may need to take insulin or other diabetes medicines daily to keep your glucose in balance. If you are prescribed insulin, you will learn how to give yourself insulin by injection. You may need to adjust the amount of insulin you take based on the foods that you eat. You will need to check your blood glucose levels using a glucose monitor as told by your health care provider. The readings can help determine if you have low or high blood glucose. Generally, you should have these blood glucose levels: Before meals (preprandial): 80-130 mg/dL (4.4-7.2 mmol/L). After meals (postprandial): below 180 mg/dL (10 mmol/L). Hemoglobin A1c (HbA1c) level: less than 7%. Your health care provider will set treatment goals for you. Keep all follow-up visits. This is important. Follow these instructions at home: Diabetes medicines Take your diabetes medicines every day as told by your health care provider. List your diabetes medicines here: Name of medicine: ______________________________ Amount (dose): _______________ Time (a.m./p.m.): _______________ Notes: ___________________________________ Name of medicine: ______________________________ Amount (dose): _______________ Time (a.m./p.m.): _______________ Notes: ___________________________________ Name of medicine: ______________________________ Amount (dose):  _______________ Time (a.m./p.m.): _______________ Notes: ___________________________________ Insulin If you use insulin, list the types of insulin you use here: Insulin type: ______________________________ Amount (dose): _______________ Time (a.m./p.m.): _______________Notes: ___________________________________ Insulin type: ______________________________ Amount (dose): _______________ Time (a.m./p.m.): _______________ Notes: ___________________________________ Insulin type: ______________________________ Amount (dose): _______________ Time (a.m./p.m.): _______________ Notes: ___________________________________ Insulin type: ______________________________ Amount (dose): _______________ Time (a.m./p.m.): _______________ Notes: ___________________________________ Insulin type: ______________________________ Amount (dose): _______________ Time (a.m./p.m.): _______________ Notes: ___________________________________ Managing blood glucose  Check your blood glucose levels using a glucose monitor as told by your health care provider. Write down the times that you check your glucose levels here: Time: _______________ Notes: ___________________________________ Time: _______________ Notes: ___________________________________ Time: _______________ Notes: ___________________________________ Time: _______________ Notes: ___________________________________ Time: _______________ Notes: ___________________________________ Time: _______________ Notes: ___________________________________  Low blood glucose Low blood glucose (hypoglycemia) is when glucose is at or below 70 mg/dL (3.9 mmol/L). Symptoms may include: Feeling: Hungry. Sweaty and clammy. Irritable or easily upset. Dizzy. Sleepy. Having: A fast heartbeat. A headache. A change in your vision. Numbness around the mouth, lips, or tongue. Having trouble with: Moving (coordination). Sleeping. Treating low blood glucose To treat low blood  glucose, eat or drink something containing sugar right away. If you can think clearly and swallow safely, follow the 15:15 rule: Take 15 grams of a fast-acting carb (carbohydrate), as told by your health care provider. Some fast-acting carbs are: Glucose tablets: take 3-4 tablets. Hard candy: eat 3-5 pieces. Fruit juice: drink 4 oz (120 mL). Regular (not diet) soda: drink 4-6 oz (120-180 mL). Honey or sugar: eat 1 Tbsp (15 mL). Check your blood glucose levels 15 minutes after you take the carb. If your glucose is still at or below 70 mg/dL (3.9 mmol/L), take 15 grams of a carb again. If your glucose does not go above 70 mg/dL (3.9 mmol/L) after   3 tries, get help right away. After your glucose goes back to normal, eat a meal or a snack within 1 hour. Treating very low blood glucose If your glucose is at or below 54 mg/dL (3 mmol/L), you have very low blood glucose (severe hypoglycemia). This is an emergency. Do not wait to see if the symptoms will go away. Get medical help right away. Call your local emergency services (911 in the [place] Do not drive yourself to the hospital. Questions to ask your health care provider Should I talk with a diabetes educator? What equipment will I need to care for myself at home? What diabetes medicines do I need? When should I take them? How often do I need to check my blood glucose levels? What number can I call if I have questions? When is my follow-up visit? Where can I find a support group for people with diabetes? Where to find more information American Diabetes Association: www.diabetes.org Association of Diabetes Care and Education Specialists: www.diabeteseducator.org Contact a health care provider if: Your blood glucose is at or above 240 mg/dL (13.3 mmol/L) for 2 days in a row. You have been sick or have had a fever for 2 days or more, and you are not getting better. You have any of these problems for more than 6 hours: You cannot eat or  drink. You feel nauseous. You vomit. You have diarrhea. Get help right away if: Your blood glucose is lower than 54 mg/dL (3 mmol/L). You get confused. You have trouble thinking clearly. You have trouble breathing. These symptoms may represent a serious problem that is an emergency. Do not wait to see if the symptoms will go away. Get medical help right away. Call your local emergency services (911 in the [place] Do not drive yourself to the hospital. Summary Diabetes mellitus is a chronic disease that occurs when the body does not properly use sugar (glucose) that is released from food after you eat. Take insulin and diabetes medicines as told. Check your blood glucose every day, as often as told. Keep all follow-up visits. This is important. This information is not intended to replace advice given to you by your health care provider. Make sure you discuss any questions you have with your health care provider. Document Revised: 12/11/2019 Document Reviewed: 12/11/2019 Elsevier Patient Education  2023 Elsevier Inc.  

## 2022-07-29 ENCOUNTER — Ambulatory Visit (INDEPENDENT_AMBULATORY_CARE_PROVIDER_SITE_OTHER): Payer: Self-pay | Admitting: Nurse Practitioner

## 2022-07-29 ENCOUNTER — Encounter: Payer: Self-pay | Admitting: Nurse Practitioner

## 2022-07-29 ENCOUNTER — Ambulatory Visit
Admission: RE | Admit: 2022-07-29 | Discharge: 2022-07-29 | Disposition: A | Discharge status: Home / Self Care | Payer: BC Managed Care – PPO | Source: Ambulatory Visit | Attending: Acute Care | Admitting: Acute Care

## 2022-07-29 VITALS — BP 137/77 | HR 67 | Temp 98.0°F | Ht 70.51 in | Wt 202.0 lb

## 2022-07-29 DIAGNOSIS — Z87891 Personal history of nicotine dependence: Secondary | ICD-10-CM

## 2022-07-29 DIAGNOSIS — I2583 Coronary atherosclerosis due to lipid rich plaque: Secondary | ICD-10-CM

## 2022-07-29 DIAGNOSIS — E1169 Type 2 diabetes mellitus with other specified complication: Secondary | ICD-10-CM

## 2022-07-29 DIAGNOSIS — E785 Hyperlipidemia, unspecified: Secondary | ICD-10-CM

## 2022-07-29 DIAGNOSIS — Z85118 Personal history of other malignant neoplasm of bronchus and lung: Secondary | ICD-10-CM

## 2022-07-29 DIAGNOSIS — E1159 Type 2 diabetes mellitus with other circulatory complications: Secondary | ICD-10-CM

## 2022-07-29 DIAGNOSIS — D518 Other vitamin B12 deficiency anemias: Secondary | ICD-10-CM

## 2022-07-29 DIAGNOSIS — J432 Centrilobular emphysema: Secondary | ICD-10-CM

## 2022-07-29 DIAGNOSIS — I700 Atherosclerosis of aorta: Secondary | ICD-10-CM

## 2022-07-29 DIAGNOSIS — Z122 Encounter for screening for malignant neoplasm of respiratory organs: Secondary | ICD-10-CM | POA: Insufficient documentation

## 2022-07-29 DIAGNOSIS — N400 Enlarged prostate without lower urinary tract symptoms: Secondary | ICD-10-CM

## 2022-07-29 DIAGNOSIS — J439 Emphysema, unspecified: Secondary | ICD-10-CM | POA: Insufficient documentation

## 2022-07-29 DIAGNOSIS — I2510 Atherosclerotic heart disease of native coronary artery without angina pectoris: Secondary | ICD-10-CM

## 2022-07-29 LAB — BAYER DCA HB A1C WAIVED: HB A1C (BAYER DCA - WAIVED): 6.7 % — ABNORMAL HIGH (ref 4.8–5.6)

## 2022-07-29 NOTE — Assessment & Plan Note (Signed)
Continue complete cessation.  Will continue annual lung screening until age 61.

## 2022-07-29 NOTE — Assessment & Plan Note (Signed)
Stable.  Continue yearly lung screening and monitoring.  Continue smoking cessation.

## 2022-07-29 NOTE — Progress Notes (Addendum)
BP 137/77   Pulse 67   Temp 98 F (36.7 C) (Oral)   Ht 5' 10.51" (1.791 m)   Wt 202 lb (91.6 kg)   SpO2 97%   BMI 28.56 kg/m    Subjective:    Patient ID: Timothy Benitez, male    DOB: [birth year], 61 y.o.   MRN: [id number]  HPI: Timothy Benitez is a 61 y.o. male  Chief Complaint  Patient presents with   Diabetes   COPD   DIABETES A1c is 6.9% in May.  Continues on Metformin 1000 MG morning and 500 MG evening. Hypoglycemic episodes:no Polydipsia/polyuria: no Visual disturbance: no Chest pain: no Paresthesias: no Glucose Monitoring: yes  Accucheck frequency: every day when at home  Fasting glucose: 125 to 140  Post prandial:  Evening:  Before meals: Taking Insulin?: no  Long acting insulin:  Short acting insulin: Blood Pressure Monitoring: not checking Retinal Examination: Up to Date --  [employer] in March Foot Exam: Up to Date Pneumovax: Up to Date Influenza: Up to Date Aspirin: no   HYPERTENSION / HYPERLIPIDEMIA Continues on Rosuvastatin. Satisfied with current treatment? yes Duration of hypertension: chronic BP monitoring frequency: occasional BP range: 120/70-80 BP medication side effects: no Duration of hyperlipidemia: chronic Cholesterol medication side effects: no Cholesterol supplements: none Past cholesterol medications:  Medication compliance: good compliance Aspirin: no Recent stressors: no Recurrent headaches: no Visual changes: no Palpitations: no Dyspnea: no Chest pain: no Lower extremity edema: no Dizzy/lightheaded: no   COPD Only using Symbicort, can not get assistance due to income being over amount for assistance.  Currently no insurance, as he was paying $5000 a year for insurance.    Quit smoking cigarettes in 2015 and cigars in 2016 -- history of lung cancer. COPD status: stable Satisfied with current treatment?: yes Oxygen use: no Dyspnea frequency: no Cough frequency: a little in morning Rescue inhaler frequency: has  not used in two years Limitation of activity: no Productive cough: none Last Spirometry: 12/01/20 Pneumovax: Up to Date Influenza: Up to Date   Relevant past medical, surgical, family and social history reviewed and updated as indicated. Interim medical history since our last visit reviewed. Allergies and medications reviewed and updated.  Review of Systems  Constitutional:  Negative for activity change, diaphoresis, fatigue and fever.  Respiratory:  Negative for cough, chest tightness, shortness of breath and wheezing.   Cardiovascular:  Negative for chest pain, palpitations and leg swelling.  Gastrointestinal: Negative.   Endocrine: Negative.   Neurological: Negative.   Psychiatric/Behavioral: Negative.     Per HPI unless specifically indicated above     Objective:    BP 137/77   Pulse 67   Temp 98 F (36.7 C) (Oral)   Ht 5' 10.51" (1.791 m)   Wt 202 lb (91.6 kg)   SpO2 97%   BMI 28.56 kg/m   Wt Readings from Last 3 Encounters:  07/29/22 202 lb (91.6 kg)  01/11/22 207 lb (93.9 kg)  07/29/21 205 lb (93 kg)    Physical Exam Vitals and nursing note reviewed.  Constitutional:      General: He is awake. He is not in acute distress.    Appearance: He is well-developed and well-groomed. He is not ill-appearing.  HENT:     Head: Normocephalic and atraumatic.     Right Ear: Hearing normal. No drainage.     Left Ear: Hearing normal. No drainage.  Eyes:     General: Lids are normal.  Right eye: No discharge.        Left eye: No discharge.     Conjunctiva/sclera: Conjunctivae normal.     Pupils: Pupils are equal, round, and reactive to light.  Neck:     Thyroid: No thyromegaly.     Vascular: No carotid bruit.     Trachea: Trachea normal.  Cardiovascular:     Rate and Rhythm: Normal rate and regular rhythm.     Heart sounds: Normal heart sounds, S1 normal and S2 normal. No murmur heard.    No gallop.  Pulmonary:     Effort: Pulmonary effort is normal. No  accessory muscle usage or respiratory distress.     Breath sounds: Normal breath sounds.  Abdominal:     General: Bowel sounds are normal.     Palpations: Abdomen is soft.  Musculoskeletal:        General: Normal range of motion.     Cervical back: Normal range of motion and neck supple.     Right lower leg: No edema.     Left lower leg: No edema.  Lymphadenopathy:     Cervical: No cervical adenopathy.  Skin:    General: Skin is warm and dry.     Capillary Refill: Capillary refill takes less than 2 seconds.  Neurological:     Mental Status: He is alert and oriented to person, place, and time.     Deep Tendon Reflexes: Reflexes are normal and symmetric.  Psychiatric:        Attention and Perception: Attention normal.        Mood and Affect: Mood normal.        Speech: Speech normal.        Behavior: Behavior normal. Behavior is cooperative.        Thought Content: Thought content normal.    Diabetic Foot Exam - Simple   Simple Foot Form Visual Inspection No deformities, no ulcerations, no other skin breakdown bilaterally: Yes Sensation Testing Intact to touch and monofilament testing bilaterally: Yes Pulse Check Posterior Tibialis and Dorsalis pulse intact bilaterally: Yes Comments      Results for orders placed or performed in visit on 07/29/22  Bayer DCA Hb A1c Waived  Result Value Ref Range   HB A1C (BAYER DCA - WAIVED) 6.7 (H) 4.8 - 5.6 %      Assessment & Plan:   Problem List Items Addressed This Visit       Cardiovascular and Mediastinum   Aortic atherosclerosis (HCC)    Chronic.  Noted on lung screenings, continue to monitor and continue daily statin.  Consider addition of ASA in future.      Coronary atherosclerosis    Chronic.  Noted on past lung screening, continue daily statin and consider addition of ASA in future.      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  Diagnosed October 2021 -- A1c today 6.7%,  stable on current regimen.  Will continue Metformin 1000 MG in morning and 500 MG at night, he is tolerating this medication well.  Goal A1c <7% and educated him on sugar goals = fasting <130 and 2 hours after meal <180.  Recommend continued focus on diet.  Urine ALB 150 today, will add on ACE or ARB at very low dose in future, discussed with patient -- he wishes to hold off. Return to office in 6 months.      Relevant Orders   Bayer DCA Hb A1c  Waived (Completed)     Respiratory   Centrilobular emphysema (HCC)    Chronic, ongoing.  Spirometry stable April 2022 and is tolerating inhalers.  Continue smoking cessation.  Discussed with patient in future could change Symbicort to LABA/LAMA like Anoro and educated him on this.  Is self pay, unable to obtain assistance at this time for inhalers -- continue lower cost regimen.        Endocrine   Hyperlipidemia associated with type 2 diabetes mellitus (HCC)    Chronic, ongoing.  Continue current medication regimen and adjust as needed.  BMP and lipid panel next visit.      Relevant Orders   Bayer DCA Hb A1c Waived (Completed)     Other   History of lung cancer    Stable.  Continue yearly lung screening and monitoring.  Continue smoking cessation.      History of smoking    Continue complete cessation.  Will continue annual lung screening until age 89.        Follow up plan: Return in about 6 months (around 01/28/2023) for T2DM, HTN/HLD, COPD.

## 2022-07-29 NOTE — Assessment & Plan Note (Signed)
Chronic, stable.  Diagnosed October 2021 -- A1c today 6.7%, stable on current regimen.  Will continue Metformin 1000 MG in morning and 500 MG at night, he is tolerating this medication well.  Goal A1c <7% and educated him on sugar goals = fasting <130 and 2 hours after meal <180.  Recommend continued focus on diet.  Urine ALB 150 today, will add on ACE or ARB at very low dose in future, discussed with patient -- he wishes to hold off. Return to office in 6 months.

## 2022-07-29 NOTE — Assessment & Plan Note (Signed)
Chronic.  Noted on lung screenings, continue to monitor and continue daily statin.  Consider addition of ASA in future.

## 2022-07-29 NOTE — Assessment & Plan Note (Signed)
Chronic.  Noted on past lung screening, continue daily statin and consider addition of ASA in future.

## 2022-07-29 NOTE — Assessment & Plan Note (Signed)
Chronic, ongoing.  Continue current medication regimen and adjust as needed.  BMP and lipid panel next visit.

## 2022-07-29 NOTE — Progress Notes (Signed)
Contacted via MyChart   Good morning Timothy Benitez your A1c returned and is 6.7%, remaining very stable.

## 2022-07-29 NOTE — Assessment & Plan Note (Signed)
Chronic, ongoing.  Spirometry stable April 2022 and is tolerating inhalers.  Continue smoking cessation.  Discussed with patient in future could change Symbicort to LABA/LAMA like Anoro and educated him on this.  Is self pay, unable to obtain assistance at this time for inhalers -- continue lower cost regimen.

## 2022-08-02 ENCOUNTER — Other Ambulatory Visit: Payer: Self-pay | Admitting: Acute Care

## 2022-08-02 DIAGNOSIS — Z122 Encounter for screening for malignant neoplasm of respiratory organs: Secondary | ICD-10-CM

## 2022-08-02 DIAGNOSIS — Z87891 Personal history of nicotine dependence: Secondary | ICD-10-CM

## 2022-08-09 ENCOUNTER — Other Ambulatory Visit: Payer: Self-pay

## 2022-08-30 ENCOUNTER — Telehealth: Payer: Self-pay

## 2022-08-30 NOTE — Telephone Encounter (Signed)
Received a VM regarding inquiry into billing question regarding recent LDCT.  Attempt to call but no answer.  Left message on VM with billing office number included, as we cannot see the billing information in the [facility] dept.

## 2022-10-13 ENCOUNTER — Other Ambulatory Visit: Payer: Self-pay

## 2022-10-19 ENCOUNTER — Other Ambulatory Visit: Payer: Self-pay | Admitting: Nurse Practitioner

## 2022-10-27 ENCOUNTER — Other Ambulatory Visit: Payer: Self-pay | Admitting: Nurse Practitioner

## 2022-10-27 MED ORDER — METFORMIN HCL 500 MG PO TABS: ORAL_TABLET | ORAL | 0 refills | Status: DC

## 2022-10-27 NOTE — Telephone Encounter (Signed)
Requested Prescriptions  Pending Prescriptions Disp Refills   metFORMIN (GLUCOPHAGE) 500 MG tablet 270 tablet 0    Sig: Take 1000 MG (2 tablets) by mouth in morning and 500 MG (one tablet) by mouth in evening with meals.  Goal sugars are <130 fasting and <180 two hours after a meal.     Endocrinology:  Diabetes - Biguanides Failed - 10/27/2022 10:20 AM      Failed - CBC within normal limits and completed in the last 12 months    WBC  Date Value Ref Range Status  07/13/2021 6.9 3.4 - 10.8 x10E3/uL Final  02/20/2015 10.2 4.0 - 10.5 K/uL Final   RBC  Date Value Ref Range Status  07/13/2021 5.13 4.14 - 5.80 x10E6/uL Final  02/20/2015 4.09 (L) 4.22 - 5.81 MIL/uL Final   Hemoglobin  Date Value Ref Range Status  07/13/2021 14.9 13.0 - 17.7 g/dL Final   Hematocrit  Date Value Ref Range Status  07/13/2021 44.3 37.5 - 51.0 % Final   MCHC  Date Value Ref Range Status  07/13/2021 33.6 31.5 - 35.7 g/dL Final  02/20/2015 33.3 30.0 - 36.0 g/dL Final   [facility]  Date Value Ref Range Status  07/13/2021 29.0 26.6 - 33.0 pg Final  02/20/2015 28.6 26.0 - 34.0 pg Final   MCV  Date Value Ref Range Status  07/13/2021 86 79 - 97 fL Final   No results found for: "PLTCOUNTKUC", "LABPLAT", "POCPLA" RDW  Date Value Ref Range Status  07/13/2021 12.5 11.6 - 15.4 % Final         Passed - Cr in normal range and within 360 days    Creatinine, Ser  Date Value Ref Range Status  01/11/2022 0.89 0.76 - 1.27 mg/dL Final         Passed - HBA1C is between 0 and 7.9 and within 180 days    HB A1C (BAYER DCA - WAIVED)  Date Value Ref Range Status  07/29/2022 6.7 (H) 4.8 - 5.6 % Final    Comment:             Prediabetes: 5.7 - 6.4          Diabetes: >6.4          Glycemic control for adults with diabetes: <7.0          Passed - eGFR in normal range and within 360 days    GFR calc Af Amer  Date Value Ref Range Status  06/02/2020 112 >59 mL/min/1.73 Final    Comment:    **[facility] currently reports  eGFR in compliance with the current**   recommendations of the [employer]. [facility] will   update reporting as new guidelines are published from the NKF-ASN   Task force.    GFR calc non Af Amer  Date Value Ref Range Status  06/02/2020 97 >59 mL/min/1.73 Final   eGFR  Date Value Ref Range Status  01/11/2022 99 >59 mL/min/1.73 Final         Passed - B12 Level in normal range and within 720 days    Vitamin B-12  Date Value Ref Range Status  01/11/2022 810 232 - 1,245 pg/mL Final         Passed - Valid encounter within last 6 months    Recent Outpatient Visits           3 months ago Type 2 diabetes mellitus with cardiac complication [facility])   [place], Barbaraann Faster, NP  9 months ago Type 2 diabetes mellitus with cardiac complication ([place])   [place], Henrine Screws T, NP   1 year ago Type 2 diabetes mellitus with cardiac complication ([place])   [place], Henrine Screws T, NP   1 year ago Type 2 diabetes mellitus with cardiac complication ([place])   [place], Henrine Screws T, NP   1 year ago Type 2 diabetes mellitus with cardiac complication [facility])   [place] Venita Lick, NP       Future Appointments             In 3 months Cannady, Barbaraann Faster, NP [place], [facility]

## 2022-10-27 NOTE — Telephone Encounter (Signed)
[place] called and spoke to [place], [facility] about the refill(s) metformin requested. Advised it was sent on 07/21/22 #270/4 refill(s). She says it's not showing they received it. Advised it will be resent.

## 2022-10-27 NOTE — Telephone Encounter (Signed)
Medication Refill - Medication: metFORMIN (GLUCOPHAGE) 500 MG tablet   Has the patient contacted their pharmacy? Yes.   Pharmacy advised pt they sent over two medication refill request to PCP.  Pt is out of medication.   Preferred Pharmacy (with phone number or street name):  [place], [place] Phone: [phone]  Fax: [phone]     Has the patient been seen for an appointment in the last year OR does the patient have an upcoming appointment? Yes.    Agent: Please be advised that RX refills may take up to 3 business days. We ask that you follow-up with your pharmacy.

## 2023-01-12 ENCOUNTER — Encounter: Payer: Self-pay | Admitting: Nurse Practitioner

## 2023-01-12 ENCOUNTER — Other Ambulatory Visit: Payer: Self-pay

## 2023-01-14 ENCOUNTER — Other Ambulatory Visit: Payer: Self-pay | Admitting: Nurse Practitioner

## 2023-01-14 MED ORDER — FLUTICASONE-SALMETEROL 115-21 MCG/ACT IN AERO: 2.0000 | INHALATION_SPRAY | Freq: Two times a day (BID) | RESPIRATORY_TRACT | 12 refills | Status: DC

## 2023-01-26 ENCOUNTER — Other Ambulatory Visit: Payer: Self-pay | Admitting: Nurse Practitioner

## 2023-01-27 ENCOUNTER — Encounter: Payer: Self-pay | Admitting: Nurse Practitioner

## 2023-01-27 ENCOUNTER — Telehealth: Payer: Self-pay

## 2023-01-27 ENCOUNTER — Telehealth: Payer: Self-pay | Admitting: Nurse Practitioner

## 2023-01-27 NOTE — Telephone Encounter (Signed)
Requested Prescriptions  Pending Prescriptions Disp Refills   rosuvastatin (CRESTOR) 40 MG tablet [Pharmacy Med Name: Rosuvastatin Calcium 40 MG Oral Tablet] 90 tablet 0    Sig: Take 1 tablet by mouth once daily     Cardiovascular:  Antilipid - Statins 2 Failed - 01/26/2023  9:19 PM      Failed - Cr in normal range and within 360 days    Creatinine, Ser  Date Value Ref Range Status  01/11/2022 0.89 0.76 - 1.27 mg/dL Final         Failed - Lipid Panel in normal range within the last 12 months    Cholesterol, Total  Date Value Ref Range Status  01/11/2022 171 100 - 199 mg/dL Final   LDL Chol Calc (NIH)  Date Value Ref Range Status  01/11/2022 91 0 - 99 mg/dL Final   HDL  Date Value Ref Range Status  01/11/2022 49 >39 mg/dL Final   Triglycerides  Date Value Ref Range Status  01/11/2022 182 (H) 0 - 149 mg/dL Final         Passed - Patient is not pregnant      Passed - Valid encounter within last 12 months    Recent Outpatient Visits           6 months ago Type 2 diabetes mellitus with cardiac complication (HCC)   [place] [facility] [place], Corrie Dandy T, NP   1 year ago Type 2 diabetes mellitus with cardiac complication (HCC)   [place] [facility] [place], Corrie Dandy T, NP   1 year ago Type 2 diabetes mellitus with cardiac complication (HCC)   [place] [facility] [place], Corrie Dandy T, NP   1 year ago Type 2 diabetes mellitus with cardiac complication (HCC)   [place] [facility] [place], [place] T, NP   2 years ago Type 2 diabetes mellitus with cardiac complication (HCC)   [place] [facility] [place], Dorie Rank, NP       Future Appointments             In 3 weeks Cannady, Dorie Rank, NP [place] [facility], [facility]

## 2023-01-27 NOTE — Telephone Encounter (Signed)
Copied from CRM [id number]. Topic: General - Other >> Jan 26, 2023  3:47 PM [name] wrote: Reason for CRM: The patient has called to request prior authorization on their budesonide-formoterol [facility]) 160-4.5 MCG/ACT inhaler [[id number]]   Please contact the patient further if needed

## 2023-01-27 NOTE — Telephone Encounter (Signed)
PA started for Symbicort on [employer] PriorAuth # [id number]

## 2023-01-27 NOTE — Telephone Encounter (Signed)
Spoke with patient and asked that he send a copy of his new insurance card through [place], patient agreed and will send copy of new insurance card so that PA can be processed

## 2023-01-30 ENCOUNTER — Encounter: Payer: Self-pay | Admitting: Nurse Practitioner

## 2023-01-31 ENCOUNTER — Ambulatory Visit: Payer: Self-pay | Admitting: Nurse Practitioner

## 2023-01-31 MED ORDER — METFORMIN HCL 500 MG PO TABS: ORAL_TABLET | ORAL | 0 refills | Status: DC

## 2023-01-31 MED ORDER — BUDESONIDE-FORMOTEROL FUMARATE 160-4.5 MCG/ACT IN AERO: 2.0000 | INHALATION_SPRAY | Freq: Two times a day (BID) | RESPIRATORY_TRACT | 12 refills | Status: DC

## 2023-01-31 NOTE — Addendum Note (Signed)
Addended by: Aura Dials T on: 01/31/2023 05:27 PM   Modules accepted: Orders

## 2023-01-31 NOTE — Telephone Encounter (Signed)
Patient states that the last few times that he has picked up his medication he was given Breyna(a generic of Symbicort) and is not taking Advair

## 2023-01-31 NOTE — Telephone Encounter (Signed)
He should have refills on the metformin and crestor. I have him on advair, not symbicort- can we confirm what he's actually been taking?

## 2023-02-18 ENCOUNTER — Ambulatory Visit: Payer: PRIVATE HEALTH INSURANCE | Admitting: Nurse Practitioner

## 2023-02-26 NOTE — Patient Instructions (Signed)
Be Involved in Your Health Care:  Taking Medications When medications are taken as directed, they can greatly improve your health. But if they are not taken as instructed, they may not work. In some cases, not taking them correctly can be harmful. To help ensure your treatment remains effective and safe, understand your medications and how to take them.  Your lab results, notes and after visit summary will be available on My Chart. We strongly encourage you to use this feature. If lab results are abnormal the clinic will contact you with the appropriate steps. If the clinic does not contact you assume the results are satisfactory. You can always see your results on My Chart. If you have questions regarding your condition, please contact the clinic during office hours. You can also ask questions on My Chart.  We at [facility] are grateful that you chose us to provide care. We strive to provide excellent and compassionate care and are always looking for feedback. If you get a survey from the clinic please complete this.   Diabetes Mellitus Basics  Diabetes mellitus, or diabetes, is a long-term (chronic) disease. It occurs when the body does not properly use sugar (glucose) that is released from food after you eat. Diabetes mellitus may be caused by one or both of these problems: Your pancreas does not make enough of a hormone called insulin. Your body does not react in a normal way to the insulin that it makes. Insulin lets glucose enter cells in your body. This gives you energy. If you have diabetes, glucose cannot get into cells. This causes high blood glucose (hyperglycemia). How to treat and manage diabetes You may need to take insulin or other diabetes medicines daily to keep your glucose in balance. If you are prescribed insulin, you will learn how to give yourself insulin by injection. You may need to adjust the amount of insulin you take based on the foods that you eat. You will  need to check your blood glucose levels using a glucose monitor as told by your health care provider. The readings can help determine if you have low or high blood glucose. Generally, you should have these blood glucose levels: Before meals (preprandial): 80-130 mg/dL (4.4-7.2 mmol/L). After meals (postprandial): below 180 mg/dL (10 mmol/L). Hemoglobin A1c (HbA1c) level: less than 7%. Your health care provider will set treatment goals for you. Keep all follow-up visits. This is important. Follow these instructions at home: Diabetes medicines Take your diabetes medicines every day as told by your health care provider. List your diabetes medicines here: Name of medicine: ______________________________ Amount (dose): _______________ Time (a.m./p.m.): _______________ Notes: ___________________________________ Name of medicine: ______________________________ Amount (dose): _______________ Time (a.m./p.m.): _______________ Notes: ___________________________________ Name of medicine: ______________________________ Amount (dose): _______________ Time (a.m./p.m.): _______________ Notes: ___________________________________ Insulin If you use insulin, list the types of insulin you use here: Insulin type: ______________________________ Amount (dose): _______________ Time (a.m./p.m.): _______________Notes: ___________________________________ Insulin type: ______________________________ Amount (dose): _______________ Time (a.m./p.m.): _______________ Notes: ___________________________________ Insulin type: ______________________________ Amount (dose): _______________ Time (a.m./p.m.): _______________ Notes: ___________________________________ Insulin type: ______________________________ Amount (dose): _______________ Time (a.m./p.m.): _______________ Notes: ___________________________________ Insulin type: ______________________________ Amount (dose): _______________ Time (a.m./p.m.): _______________  Notes: ___________________________________ Managing blood glucose  Check your blood glucose levels using a glucose monitor as told by your health care provider. Write down the times that you check your glucose levels here: Time: _______________ Notes: ___________________________________ Time: _______________ Notes: ___________________________________ Time: _______________ Notes: ___________________________________ Time: _______________ Notes: ___________________________________ Time: _______________ Notes: ___________________________________ Time: _______________ Notes: ___________________________________    Low blood glucose Low blood glucose (hypoglycemia) is when glucose is at or below 70 mg/dL (3.9 mmol/L). Symptoms may include: Feeling: Hungry. Sweaty and clammy. Irritable or easily upset. Dizzy. Sleepy. Having: A fast heartbeat. A headache. A change in your vision. Numbness around the mouth, lips, or tongue. Having trouble with: Moving (coordination). Sleeping. Treating low blood glucose To treat low blood glucose, eat or drink something containing sugar right away. If you can think clearly and swallow safely, follow the 15:15 rule: Take 15 grams of a fast-acting carb (carbohydrate), as told by your health care provider. Some fast-acting carbs are: Glucose tablets: take 3-4 tablets. Hard candy: eat 3-5 pieces. Fruit juice: drink 4 oz (120 mL). Regular (not diet) soda: drink 4-6 oz (120-180 mL). Honey or sugar: eat 1 Tbsp (15 mL). Check your blood glucose levels 15 minutes after you take the carb. If your glucose is still at or below 70 mg/dL (3.9 mmol/L), take 15 grams of a carb again. If your glucose does not go above 70 mg/dL (3.9 mmol/L) after 3 tries, get help right away. After your glucose goes back to normal, eat a meal or a snack within 1 hour. Treating very low blood glucose If your glucose is at or below 54 mg/dL (3 mmol/L), you have very low blood glucose  (severe hypoglycemia). This is an emergency. Do not wait to see if the symptoms will go away. Get medical help right away. Call your local emergency services (911 in the [place] Do not drive yourself to the hospital. Questions to ask your health care provider Should I talk with a diabetes educator? What equipment will I need to care for myself at home? What diabetes medicines do I need? When should I take them? How often do I need to check my blood glucose levels? What number can I call if I have questions? When is my follow-up visit? Where can I find a support group for people with diabetes? Where to find more information American Diabetes Association: www.diabetes.org Association of Diabetes Care and Education Specialists: www.diabeteseducator.org Contact a health care provider if: Your blood glucose is at or above 240 mg/dL (13.3 mmol/L) for 2 days in a row. You have been sick or have had a fever for 2 days or more, and you are not getting better. You have any of these problems for more than 6 hours: You cannot eat or drink. You feel nauseous. You vomit. You have diarrhea. Get help right away if: Your blood glucose is lower than 54 mg/dL (3 mmol/L). You get confused. You have trouble thinking clearly. You have trouble breathing. These symptoms may represent a serious problem that is an emergency. Do not wait to see if the symptoms will go away. Get medical help right away. Call your local emergency services (911 in the [place] Do not drive yourself to the hospital. Summary Diabetes mellitus is a chronic disease that occurs when the body does not properly use sugar (glucose) that is released from food after you eat. Take insulin and diabetes medicines as told. Check your blood glucose every day, as often as told. Keep all follow-up visits. This is important. This information is not intended to replace advice given to you by your health care provider. Make sure you discuss any  questions you have with your health care provider. Document Revised: 12/11/2019 Document Reviewed: 12/11/2019 Elsevier Patient Education  2024 [employer].  

## 2023-02-28 ENCOUNTER — Ambulatory Visit: Payer: PRIVATE HEALTH INSURANCE | Admitting: Nurse Practitioner

## 2023-02-28 VITALS — BP 136/72 | HR 69 | Temp 97.9°F | Ht 70.51 in | Wt 207.8 lb

## 2023-02-28 DIAGNOSIS — Z85118 Personal history of other malignant neoplasm of bronchus and lung: Secondary | ICD-10-CM

## 2023-02-28 DIAGNOSIS — I2583 Coronary atherosclerosis due to lipid rich plaque: Secondary | ICD-10-CM

## 2023-02-28 DIAGNOSIS — N400 Enlarged prostate without lower urinary tract symptoms: Secondary | ICD-10-CM

## 2023-02-28 DIAGNOSIS — I2510 Atherosclerotic heart disease of native coronary artery without angina pectoris: Secondary | ICD-10-CM

## 2023-02-28 DIAGNOSIS — E785 Hyperlipidemia, unspecified: Secondary | ICD-10-CM

## 2023-02-28 DIAGNOSIS — I700 Atherosclerosis of aorta: Secondary | ICD-10-CM

## 2023-02-28 DIAGNOSIS — J432 Centrilobular emphysema: Secondary | ICD-10-CM

## 2023-02-28 DIAGNOSIS — E1169 Type 2 diabetes mellitus with other specified complication: Secondary | ICD-10-CM | POA: Diagnosis not present

## 2023-02-28 DIAGNOSIS — Z87891 Personal history of nicotine dependence: Secondary | ICD-10-CM

## 2023-02-28 DIAGNOSIS — E1159 Type 2 diabetes mellitus with other circulatory complications: Secondary | ICD-10-CM | POA: Diagnosis not present

## 2023-02-28 DIAGNOSIS — D518 Other vitamin B12 deficiency anemias: Secondary | ICD-10-CM

## 2023-02-28 DIAGNOSIS — Z7984 Long term (current) use of oral hypoglycemic drugs: Secondary | ICD-10-CM

## 2023-02-28 LAB — MICROALBUMIN, URINE WAIVED
Creatinine, Urine Waived: 200 mg/dL (ref 10–300)
Microalb, Ur Waived: 30 mg/L — ABNORMAL HIGH (ref 0–19)
Microalb/Creat Ratio: <30 mg/g (ref <30)

## 2023-02-28 LAB — BAYER DCA HB A1C WAIVED: HB A1C (BAYER DCA - WAIVED): 7.4 % — ABNORMAL HIGH (ref 4.8–5.6)

## 2023-02-28 MED ORDER — METFORMIN HCL 500 MG PO TABS: ORAL_TABLET | ORAL | 4 refills | Status: DC

## 2023-02-28 MED ORDER — ROSUVASTATIN CALCIUM 40 MG PO TABS: 40.0000 mg | ORAL_TABLET | Freq: Every day | ORAL | 4 refills | Status: DC

## 2023-02-28 NOTE — Assessment & Plan Note (Signed)
Chronic.  Noted on past lung screening, continue daily statin and consider addition of ASA in future. 

## 2023-02-28 NOTE — Assessment & Plan Note (Signed)
Chronic, ongoing.  Continue current medication regimen and adjust as needed.  CMP and lipid panel today. 

## 2023-02-28 NOTE — Assessment & Plan Note (Signed)
Stable.  Continue yearly lung screening and monitoring.  Continue smoking cessation. 

## 2023-02-28 NOTE — Assessment & Plan Note (Signed)
Chronic.  Noted on lung screenings, continue to monitor and continue daily statin.  Consider addition of ASA in future. 

## 2023-02-28 NOTE — Assessment & Plan Note (Signed)
Continue complete cessation.  Will continue annual lung screening until age 60. 

## 2023-02-28 NOTE — Progress Notes (Signed)
BP 136/72 (BP Location: Left Arm, Patient Position: Sitting, Cuff Size: Normal)   Pulse 69   Temp 97.9 F (36.6 C) (Oral)   Ht 5' 10.51" (1.791 m)   Wt 207 lb 12.8 oz (94.3 kg)   SpO2 96%   BMI 29.38 kg/m    Subjective:    Patient ID: Timothy Benitez, male    DOB: April 11, 1961, 62 y.o.   MRN: [id number]  HPI: Timothy Benitez is a 62 y.o. male  Chief Complaint  Patient presents with   Diabetes   COPD   DIABETES A1c 6.7% December.  Continues on Metformin 1000 MG morning and 500 MG evening.  Has had some dietary indiscretions since last visit.  Continues on B12 supplements at home for history of low levels. Hypoglycemic episodes:no Polydipsia/polyuria: no Visual disturbance: no Chest pain: no Paresthesias: no Glucose Monitoring: yes  Accucheck frequency: occasionally  Fasting glucose: 120-130 range  Post prandial:  Evening:  Before meals: Taking Insulin?: no  Long acting insulin:  Short acting insulin: Blood Pressure Monitoring: not checking Retinal Examination: Not Up to Date --  [employer]  Foot Exam: Up to Date Pneumovax: Up to Date Influenza: Up to Date Aspirin: no   HYPERTENSION / HYPERLIPIDEMIA Continues on Rosuvastatin. Satisfied with current treatment? yes Duration of hypertension: chronic BP monitoring frequency: occasional BP range: 120-130/70-80 BP medication side effects: no Duration of hyperlipidemia: chronic Cholesterol medication side effects: no Cholesterol supplements: none Past cholesterol medications:  Medication compliance: good compliance Aspirin: no Recent stressors: no Recurrent headaches: no Visual changes: no Palpitations: no Dyspnea: no Chest pain: no Lower extremity edema: no Dizzy/lightheaded: no   COPD Continues on Breyna 2 puffs BID and no Albuterol.  Last lung screening in December 2023 noting emphysema and aortic atherosclerosis.  Quit smoking cigarettes in 2015 and cigars in 2016 -- history of lung cancer. COPD  status: stable Satisfied with current treatment?: yes Oxygen use: no Dyspnea frequency: no Cough frequency: no Rescue inhaler frequency: has not used in many years Limitation of activity: no Productive cough: none Last Spirometry: 12/01/20 Pneumovax: Up to Date Influenza: Up to Date   Relevant past medical, surgical, family and social history reviewed and updated as indicated. Interim medical history since our last visit reviewed. Allergies and medications reviewed and updated.  Review of Systems  Constitutional:  Negative for activity change, diaphoresis, fatigue and fever.  Respiratory:  Negative for cough, chest tightness, shortness of breath and wheezing.   Cardiovascular:  Negative for chest pain, palpitations and leg swelling.  Gastrointestinal: Negative.   Endocrine: Negative.   Neurological: Negative.   Psychiatric/Behavioral: Negative.     Per HPI unless specifically indicated above     Objective:    BP 136/72 (BP Location: Left Arm, Patient Position: Sitting, Cuff Size: Normal)   Pulse 69   Temp 97.9 F (36.6 C) (Oral)   Ht 5' 10.51" (1.791 m)   Wt 207 lb 12.8 oz (94.3 kg)   SpO2 96%   BMI 29.38 kg/m   Wt Readings from Last 3 Encounters:  02/28/23 207 lb 12.8 oz (94.3 kg)  07/29/22 202 lb (91.6 kg)  01/11/22 207 lb (93.9 kg)    Physical Exam Vitals and nursing note reviewed.  Constitutional:      General: He is awake. He is not in acute distress.    Appearance: He is well-developed and well-groomed. He is not ill-appearing.  HENT:     Head: Normocephalic and atraumatic.     Right Ear:  Hearing normal. No drainage.     Left Ear: Hearing normal. No drainage.  Eyes:     General: Lids are normal.        Right eye: No discharge.        Left eye: No discharge.     Conjunctiva/sclera: Conjunctivae normal.     Pupils: Pupils are equal, round, and reactive to light.  Neck:     Thyroid: No thyromegaly.     Vascular: No carotid bruit.     Trachea: Trachea  normal.  Cardiovascular:     Rate and Rhythm: Normal rate and regular rhythm.     Heart sounds: Normal heart sounds, S1 normal and S2 normal. No murmur heard.    No gallop.  Pulmonary:     Effort: Pulmonary effort is normal. No accessory muscle usage or respiratory distress.     Breath sounds: Normal breath sounds.  Abdominal:     General: Bowel sounds are normal.     Palpations: Abdomen is soft.  Musculoskeletal:        General: Normal range of motion.     Cervical back: Normal range of motion and neck supple.     Right lower leg: No edema.     Left lower leg: No edema.  Lymphadenopathy:     Cervical: No cervical adenopathy.  Skin:    General: Skin is warm and dry.     Capillary Refill: Capillary refill takes less than 2 seconds.  Neurological:     Mental Status: He is alert and oriented to person, place, and time.     Deep Tendon Reflexes: Reflexes are normal and symmetric.  Psychiatric:        Attention and Perception: Attention normal.        Mood and Affect: Mood normal.        Speech: Speech normal.        Behavior: Behavior normal. Behavior is cooperative.        Thought Content: Thought content normal.    Results for orders placed or performed in visit on 01/12/23  HM DIABETES EYE EXAM  Result Value Ref Range   HM Diabetic Eye Exam No Retinopathy No Retinopathy      Assessment & Plan:   Problem List Items Addressed This Visit       Cardiovascular and Mediastinum   Aortic atherosclerosis (HCC)    Chronic.  Noted on lung screenings, continue to monitor and continue daily statin.  Consider addition of ASA in future.      Relevant Medications   rosuvastatin (CRESTOR) 40 MG tablet   Other Relevant Orders   Comprehensive metabolic panel   Lipid Panel w/o Chol/HDL Ratio   Coronary atherosclerosis    Chronic.  Noted on past lung screening, continue daily statin and consider addition of ASA in future.      Relevant Medications   rosuvastatin (CRESTOR) 40 MG  tablet   Other Relevant Orders   Comprehensive metabolic panel   Lipid Panel w/o Chol/HDL Ratio   Type 2 diabetes mellitus with cardiac complication (HCC) - Primary    Chronic, stable.  Diagnosed October 2021 -- A1c today 7.4%, slight trend up due to dietary indiscretions.  Will continue Metformin 1000 MG in morning and 500 MG at night, he is tolerating this medication well.  Goal A1c <7% and educated him on sugar goals = fasting <130 and 2 hours after meal <180.  Recommend heavier focus on diet and if remains above goal next visit will  need to change regimen.  Urine ALB 22 March 2023, will add on ACE or ARB at very low dose in future, discussed with patient -- he wishes to hold off. Return to office in 6 months.      Relevant Medications   metFORMIN (GLUCOPHAGE) 500 MG tablet   rosuvastatin (CRESTOR) 40 MG tablet   Other Relevant Orders   Bayer DCA Hb A1c Waived   Microalbumin, Urine Waived   TSH     Respiratory   Centrilobular emphysema (HCC)    Chronic, ongoing.  Spirometry stable April 2022 and is tolerating inhalers.  Continue smoking cessation.  Will continue current inhaler regimen and adjust as needed.  Recommend he continue annual lung screening.      Relevant Orders   CBC with Differential/Platelet   TSH     Endocrine   Hyperlipidemia associated with type 2 diabetes mellitus (HCC)    Chronic, ongoing.  Continue current medication regimen and adjust as needed.  CMP and lipid panel today.      Relevant Medications   metFORMIN (GLUCOPHAGE) 500 MG tablet   rosuvastatin (CRESTOR) 40 MG tablet   Other Relevant Orders   Bayer DCA Hb A1c Waived   Comprehensive metabolic panel   Lipid Panel w/o Chol/HDL Ratio     Other   B12 deficiency anemia    Chronic, ongoing -- recommend he take supplement daily and adjust as needed.  Is on long term Metformin.      Relevant Orders   CBC with Differential/Platelet   Vitamin B12   History of lung cancer    Stable.  Continue yearly  lung screening and monitoring.  Continue smoking cessation.      History of smoking    Continue complete cessation.  Will continue annual lung screening until age 50.      Other Visit Diagnoses     Benign prostatic hyperplasia without lower urinary tract symptoms       PSA on labs today.   Relevant Orders   PSA        Follow up plan: Return in about 6 months (around 08/31/2023) for T2DM, HTN/HLD, COPD -- spirometry needed.

## 2023-02-28 NOTE — Assessment & Plan Note (Signed)
Chronic, ongoing.  Spirometry stable April 2022 and is tolerating inhalers.  Continue smoking cessation.  Will continue current inhaler regimen and adjust as needed.  Recommend he continue annual lung screening.

## 2023-02-28 NOTE — Assessment & Plan Note (Signed)
Chronic, ongoing -- recommend he take supplement daily and adjust as needed.  Is on long term Metformin. 

## 2023-02-28 NOTE — Assessment & Plan Note (Signed)
Chronic, stable.  Diagnosed October 2021 -- A1c today 7.4%, slight trend up due to dietary indiscretions.  Will continue Metformin 1000 MG in morning and 500 MG at night, he is tolerating this medication well.  Goal A1c <7% and educated him on sugar goals = fasting <130 and 2 hours after meal <180.  Recommend heavier focus on diet and if remains above goal next visit will need to change regimen.  Urine ALB 22 March 2023, will add on ACE or ARB at very low dose in future, discussed with patient -- he wishes to hold off. Return to office in 6 months.

## 2023-03-01 LAB — CBC WITH DIFFERENTIAL/PLATELET
Basophils Absolute: 0 10*3/uL (ref 0.0–0.2)
Basos: 0 %
EOS (ABSOLUTE): 0.1 10*3/uL (ref 0.0–0.4)
Eos: 1 %
Hematocrit: 43.9 % (ref 37.5–51.0)
Hemoglobin: 14.7 g/dL (ref 13.0–17.7)
Immature Grans (Abs): 0 10*3/uL (ref 0.0–0.1)
Immature Granulocytes: 0 %
Lymphocytes Absolute: 1.5 10*3/uL (ref 0.7–3.1)
Lymphs: 17 %
MCH: 28.2 pg (ref 26.6–33.0)
MCHC: 33.5 g/dL (ref 31.5–35.7)
MCV: 84 fL (ref 79–97)
Monocytes Absolute: 0.6 10*3/uL (ref 0.1–0.9)
Monocytes: 7 %
Neutrophils Absolute: 6.3 10*3/uL (ref 1.4–7.0)
Neutrophils: 75 %
Platelets: 204 10*3/uL (ref 150–450)
RBC: 5.21 x10E6/uL (ref 4.14–5.80)
RDW: 12.3 % (ref 11.6–15.4)
WBC: 8.5 10*3/uL (ref 3.4–10.8)

## 2023-03-01 LAB — COMPREHENSIVE METABOLIC PANEL
ALT: 45 IU/L — ABNORMAL HIGH (ref 0–44)
AST: 24 IU/L (ref 0–40)
Albumin: 4.3 g/dL (ref 3.8–4.9)
Alkaline Phosphatase: 74 IU/L (ref 44–121)
BUN/Creatinine Ratio: 17 (ref 10–24)
BUN: 17 mg/dL (ref 8–27)
Bilirubin Total: 0.7 mg/dL (ref 0.0–1.2)
CO2: 24 mmol/L (ref 20–29)
Calcium: 9.2 mg/dL (ref 8.6–10.2)
Chloride: 97 mmol/L (ref 96–106)
Creatinine, Ser: 1 mg/dL (ref 0.76–1.27)
Globulin, Total: 1.9 g/dL (ref 1.5–4.5)
Glucose: 206 mg/dL — ABNORMAL HIGH (ref 70–99)
Potassium: 4.2 mmol/L (ref 3.5–5.2)
Sodium: 138 mmol/L (ref 134–144)
Total Protein: 6.2 g/dL (ref 6.0–8.5)
eGFR: 86 mL/min/{1.73_m2} (ref >59)

## 2023-03-01 LAB — LIPID PANEL W/O CHOL/HDL RATIO
Cholesterol, Total: 197 mg/dL (ref 100–199)
HDL: 55 mg/dL (ref >39)
LDL Chol Calc (NIH): 80 mg/dL (ref 0–99)
Triglycerides: 387 mg/dL — ABNORMAL HIGH (ref 0–149)
VLDL Cholesterol Cal: 62 mg/dL — ABNORMAL HIGH (ref 5–40)

## 2023-03-01 LAB — VITAMIN B12: Vitamin B-12: 1010 pg/mL (ref 232–1245)

## 2023-03-01 LAB — TSH: TSH: 0.873 u[IU]/mL (ref 0.450–4.500)

## 2023-03-01 LAB — PSA: Prostate Specific Ag, Serum: 0.8 ng/mL (ref 0.0–4.0)

## 2023-03-01 NOTE — Progress Notes (Signed)
Contacted via MyChart   Good morning Timothy Benitez, your labs have returned: - Kidney function, creatinine and eGFR, remains normal.  Liver function shows very mild elevation in ALT which we will recheck next visit. - Cholesterol levels show some ongoing elevation in triglycerides and LDL is not quite at goal, please come fasting next visit and we will recheck.  Continue Rosuvastatin. - Remainder of labs are all stable.  Great job.  Any questions? Keep being stellar!!  Thank you for allowing me to participate in your care.  I appreciate you. Kindest regards, [facility] [place]

## 2023-04-19 ENCOUNTER — Ambulatory Visit (INDEPENDENT_AMBULATORY_CARE_PROVIDER_SITE_OTHER): Payer: PRIVATE HEALTH INSURANCE

## 2023-04-19 ENCOUNTER — Encounter: Payer: Self-pay | Admitting: Podiatry

## 2023-04-19 ENCOUNTER — Ambulatory Visit: Payer: PRIVATE HEALTH INSURANCE | Admitting: Podiatry

## 2023-04-19 DIAGNOSIS — M722 Plantar fascial fibromatosis: Secondary | ICD-10-CM

## 2023-04-19 MED ORDER — TRIAMCINOLONE ACETONIDE 40 MG/ML IJ SUSP
20.0000 mg | Freq: Once | INTRAMUSCULAR | Status: AC
  Administered 2023-04-19: 20 mg

## 2023-04-19 MED ORDER — MELOXICAM 15 MG PO TABS: 15.0000 mg | ORAL_TABLET | Freq: Every day | ORAL | 3 refills | Status: DC

## 2023-04-19 NOTE — Progress Notes (Signed)
Subjective:  Patient ID: Timothy Benitez, male    DOB: 09-28-1960,  MRN: [id number] HPI Chief Complaint  Patient presents with   Foot Pain    Plantar heel right - aching since 03/14/23, AM pain, sharp pains at times when walking, tried Ibuprofen   Diabetes    Last A1c was 7.4   New Patient (Initial Visit)    62 y.o. male presents with the above complaint.   ROS: Denies fever chills nausea vomiting muscle aches pains calf pain back pain chest pain shortness of breath.  Past Medical History:  Diagnosis Date   COPD (chronic obstructive pulmonary disease) (HCC)    Symbicort daily and Albuterol prn   Diabetes mellitus without complication (HCC)    History of bronchitis 6 months ago   Hyperlipidemia    takes Atorvastatin daily   Joint pain    Lung cancer (HCC)    Sleep apnea    "mild" never needed CPAP   Past Surgical History:  Procedure Laterality Date   BRONCHOSCOPY     ENDOBRONCHIAL ULTRASOUND N/A 01/02/2015   Procedure: ENDOBRONCHIAL ULTRASOUND;  Surgeon: Erin Fulling, MD;  Location: [facility] ORS;  Service: Cardiopulmonary;  Laterality: N/A;   MULTIPLE EXTRACTIONS WITH ALVEOLOPLASTY N/A 01/29/2015   Procedure: Extraction of tooth #'s 2,3,4,5,6,7,8,9,10,11,12,13,14,15,18,20,21,22,23, 25,26,27,28,29,30 with alveoloplasty and mandibular right lateral exostoses reductions;  Surgeon: Charlynne Pander, DDS;  Location: [facility] OR;  Service: Oral Surgery;  Laterality: N/A;   THORACOTOMY/LOBECTOMY Left 02/17/2015   WITH LEFT UPPER LOBECTOMY WITH LYMPH NODE DISSECTION, OPEN CLOSURE OF LEFT UPPER LOBE BRONCHUS, INTERCOSTAL MUSCLE FLAP COVERAGE OF LEFT UPPER LOBE BRONCHIAL STUMP, PLACEMENT OF ON-Q (Left)   TONSILLECTOMY  1969   VASECTOMY  2001   VIDEO ASSISTED THORACOSCOPY (VATS)/ LOBECTOMY Left 02/17/2015   Procedure: THORACOTOMY WITH LEFT UPPER LOBECTOMY WITH LYMPH NODE DISSECTION, OPEN CLOSURE OF LEFT UPPER LOBE BRONCHUS, INTERCOSTAL MUSCLE FLAP COVERAGE OF LEFT UPPER LOBE BRONCHIAL STUMP, PLACEMENT  OF ON-Q;  Surgeon: Delight Ovens, MD;  Location: [facility] OR;  Service: Thoracic;  Laterality: Left;   VIDEO BRONCHOSCOPY  02/17/2015   VIDEO BRONCHOSCOPY N/A 02/17/2015   Procedure: VIDEO BRONCHOSCOPY;  Surgeon: Delight Ovens, MD;  Location: [facility] OR;  Service: Thoracic;  Laterality: N/A;   WISDOM TOOTH EXTRACTION      Current Outpatient Medications:    meloxicam (MOBIC) 15 MG tablet, Take 1 tablet (15 mg total) by mouth daily., Disp: 30 tablet, Rfl: 3   Blood Glucose Monitoring Suppl (CONTOUR NEXT MONITOR) w/Device KIT, To check blood sugar twice a day, once in morning fasting and once 2 hours after a meal -- goal blood sugars are less than 130 fasting and less than 180 two hours after eating., Disp: 1 kit, Rfl: 0   budesonide-formoterol (BREYNA) 160-4.5 MCG/ACT inhaler, Inhale 2 puffs into the lungs 2 (two) times daily., Disp: 1 each, Rfl: 12   Comfort Lancets MISC, To check blood sugar twice a day, once in morning fasting and once 2 hours after a meal -- goal blood sugars are less than 130 fasting and less than 180 two hours after eating., Disp: 100 each, Rfl: 12   glucose blood test strip, To check blood sugar twice a day, once in morning fasting and once 2 hours after a meal -- goal blood sugars are less than 130 fasting and less than 180 two hours after eating., Disp: 100 each, Rfl: 12   Magnesium 200 MG TABS, Take 1 tablet by mouth daily., Disp: , Rfl:  metFORMIN (GLUCOPHAGE) 500 MG tablet, Take 1000 MG (2 tablets) by mouth in morning and 500 MG (one tablet) by mouth in evening with meals.  Goal sugars are <130 fasting and <180 two hours after a meal., Disp: 90 tablet, Rfl: 4   Multiple Vitamins-Minerals (AIRBORNE) CHEW, Chew 2 tablets by mouth daily., Disp: , Rfl:    Potassium 95 MG TABS, Take 95 mg by mouth daily., Disp: , Rfl:    rosuvastatin (CRESTOR) 40 MG tablet, Take 1 tablet (40 mg total) by mouth daily., Disp: 90 tablet, Rfl: 4  No Known Allergies Review of Systems Objective:   There were no vitals filed for this visit.  General: Well developed, nourished, in no acute distress, alert and oriented x3   Dermatological: Skin is warm, dry and supple bilateral. Nails x 10 are well maintained; remaining integument appears unremarkable at this time. There are no open sores, no preulcerative lesions, no rash or signs of infection present.  Vascular: Dorsalis Pedis artery and Posterior Tibial artery pedal pulses are 2/4 bilateral with immedate capillary fill time. Pedal hair growth present. No varicosities and no lower extremity edema present bilateral.   Neruologic: Grossly intact via light touch bilateral. Vibratory intact via tuning fork bilateral. Protective threshold with Semmes Wienstein monofilament intact to all pedal sites bilateral. Patellar and Achilles deep tendon reflexes 2+ bilateral. No Babinski or clonus noted bilateral.   Musculoskeletal: No gross boney pedal deformities bilateral. No pain, crepitus, or limitation noted with foot and ankle range of motion bilateral. Muscular strength 5/5 in all groups tested bilateral.  Pain on palpation medial calcaneal tubercle of the right heel.  Gait: Unassisted, Nonantalgic.    Radiographs:  Radiographs taken today demonstrate osseously mature individual plantar distally oriented calcaneal heel spur some early osteoarthritic changes at the first metatarsophalangeal joint and the tarsometatarsal joints of the right foot.  Small plantar distally oriented canny heel spur with soft tissue increased density surrounding it consistent with plantar fasciitis.    Assessment & Plan:   Assessment: Plantar fasciitis right  Plan: Discussed etiology pathology conservative versus surgical therapies started him on meloxicam, I injected his heel, discussed appropriate shoe gear stretching exercise ice therapy and sugar modifications and placed him in a plantar fascial brace.  Follow-up with him in 1 month.     Cynai Skeens T. [place], [place]

## 2023-04-19 NOTE — Patient Instructions (Signed)

## 2023-05-20 LAB — HM DIABETES EYE EXAM

## 2023-05-30 ENCOUNTER — Encounter: Payer: Self-pay | Admitting: Nurse Practitioner

## 2023-06-06 ENCOUNTER — Other Ambulatory Visit: Payer: Self-pay

## 2023-06-06 MED ORDER — METFORMIN HCL 500 MG PO TABS: ORAL_TABLET | ORAL | 4 refills | Status: DC

## 2023-06-13 ENCOUNTER — Encounter: Payer: Self-pay | Admitting: Nurse Practitioner

## 2023-06-13 ENCOUNTER — Telehealth: Payer: Self-pay | Admitting: Nurse Practitioner

## 2023-06-13 NOTE — Telephone Encounter (Signed)
Copied from CRM [id number]. Topic: General - Inquiry >> Jun 13, 2023  9:56 AM Haroldine Laws wrote: Reason for CRM: pt needs a note to say he can have regular duty/ no restrictions with his copd.  CB@ [phone] or fax to [phone] [facility] [place]

## 2023-06-13 NOTE — Telephone Encounter (Signed)
Patient notified

## 2023-06-27 ENCOUNTER — Ambulatory Visit: Payer: PRIVATE HEALTH INSURANCE | Admitting: Podiatry

## 2023-08-01 ENCOUNTER — Ambulatory Visit
Admission: RE | Admit: 2023-08-01 | Discharge: 2023-08-01 | Disposition: A | Discharge status: Home / Self Care | Payer: BC Managed Care – PPO | Source: Ambulatory Visit | Attending: Acute Care | Admitting: Acute Care

## 2023-08-01 DIAGNOSIS — Z87891 Personal history of nicotine dependence: Secondary | ICD-10-CM | POA: Diagnosis not present

## 2023-08-01 DIAGNOSIS — Z122 Encounter for screening for malignant neoplasm of respiratory organs: Secondary | ICD-10-CM | POA: Diagnosis not present

## 2023-08-09 ENCOUNTER — Ambulatory Visit: Payer: Self-pay
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
## 2023-08-12 ENCOUNTER — Encounter: Payer: Self-pay | Admitting: Nurse Practitioner

## 2023-08-12 ENCOUNTER — Ambulatory Visit (INDEPENDENT_AMBULATORY_CARE_PROVIDER_SITE_OTHER): Payer: BC Managed Care – PPO | Admitting: Nurse Practitioner

## 2023-08-12 VITALS — BP 123/68 | HR 85 | Temp 98.7°F | Ht 70.2 in | Wt 208.6 lb

## 2023-08-12 DIAGNOSIS — M25511 Pain in right shoulder: Secondary | ICD-10-CM | POA: Diagnosis not present

## 2023-08-12 MED ORDER — CYCLOBENZAPRINE HCL 10 MG PO TABS: 10.0000 mg | ORAL_TABLET | Freq: Every day | ORAL | 0 refills | Status: DC
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
## 2023-08-12 NOTE — Patient Instructions (Signed)
Shoulder Pain Many things can cause shoulder pain, including: An injury. Moving the shoulder in the same way again and again (overuse). Joint pain (arthritis). Pain can come from: Swelling and irritation (inflammation) of any part of the shoulder. An injury to: The shoulder joint. Tissues that connect muscle to bone (tendons). Tissues that connect bones to each other (ligaments). Bones. Follow these instructions at home: Watch for changes in your symptoms. Let your doctor know about them. Follow these instructions to help with your pain. If you have a sling that can be taken off: Wear the sling as told by your doctor. Take it off only as told by your doctor. Check the skin around the sling every day. Tell your doctor if you see problems. Loosen the sling if your fingers: Tingle. Become numb. Become cold. Keep the sling clean. If the sling is not waterproof: Do not let it get wet. Take the sling off when you shower or bathe. Managing pain, stiffness, and swelling  If told, put ice on the painful area. Put ice in a plastic bag. Place a towel between your skin and the bag. Leave the ice on for 20 minutes, 2-3 times a day. Stop putting ice on if it does not help with the pain. If your skin turns bright red, take off the ice right away to prevent skin damage. The risk of damage is higher if you cannot feel pain, heat, or cold. Squeeze a soft ball or a foam pad as much as possible. This prevents swelling in the shoulder. It also helps to strengthen the arm. General instructions Take over-the-counter and prescription medicines only as told by your doctor. Keep all follow-up visits. This will help you avoid any type of permanent shoulder problems. Contact a doctor if: Your pain gets worse. Medicine does not help your pain. You have new pain in your arm, hand, or fingers. You loosen your sling and your arm, hand, or fingers: Tingle. Are numb. Are swollen. Get help right away  if: Your arm, hand, or fingers turn white or blue. This information is not intended to replace advice given to you by your health care provider. Make sure you discuss any questions you have with your health care provider. Document Revised: 03/12/2022 Document Reviewed: 03/12/2022 Elsevier Patient Education  2024 Elsevier Inc.  
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
## 2023-08-15 ENCOUNTER — Other Ambulatory Visit: Payer: Self-pay

## 2023-08-15 DIAGNOSIS — Z122 Encounter for screening for malignant neoplasm of respiratory organs: Secondary | ICD-10-CM

## 2023-08-15 DIAGNOSIS — Z87891 Personal history of nicotine dependence: Secondary | ICD-10-CM

## 2023-08-29 ENCOUNTER — Ambulatory Visit: Payer: PRIVATE HEALTH INSURANCE | Admitting: Nurse Practitioner

## 2023-08-29 DIAGNOSIS — E1169 Type 2 diabetes mellitus with other specified complication: Secondary | ICD-10-CM

## 2023-08-29 DIAGNOSIS — E1159 Type 2 diabetes mellitus with other circulatory complications: Secondary | ICD-10-CM

## 2023-08-29 DIAGNOSIS — I2583 Coronary atherosclerosis due to lipid rich plaque: Secondary | ICD-10-CM

## 2023-08-29 DIAGNOSIS — J432 Centrilobular emphysema: Secondary | ICD-10-CM

## 2023-08-29 DIAGNOSIS — I700 Atherosclerosis of aorta: Secondary | ICD-10-CM

## 2023-08-29 DIAGNOSIS — Z85118 Personal history of other malignant neoplasm of bronchus and lung: Secondary | ICD-10-CM

## 2023-11-17 ENCOUNTER — Other Ambulatory Visit: Payer: Self-pay | Admitting: Nurse Practitioner

## 2023-11-17 MED ORDER — METFORMIN HCL 500 MG PO TABS: ORAL_TABLET | ORAL | 0 refills | Status: DC

## 2023-11-17 NOTE — Telephone Encounter (Signed)
 Requested medication (s) are due for refill today: yes  Requested medication (s) are on the active medication list: yes  Last refill:  06/06/23 #90/4  Future visit scheduled: no, pt had scheduled but canceled and never r/s  Notes to clinic:  Unable to refill per protocol due to failed labs, no updated results.      Requested Prescriptions  Pending Prescriptions Disp Refills   metFORMIN (GLUCOPHAGE) 500 MG tablet 90 tablet 4    Sig: Take 1000 MG (2 tablets) by mouth in morning and 500 MG (one tablet) by mouth in evening with meals.  Goal sugars are <130 fasting and <180 two hours after a meal.     Endocrinology:  Diabetes - Biguanides Failed - 11/17/2023 12:13 PM      Failed - HBA1C is between 0 and 7.9 and within 180 days    HB A1C (BAYER DCA - WAIVED)  Date Value Ref Range Status  02/28/2023 7.4 (H) 4.8 - 5.6 % Final    Comment:             Prediabetes: 5.7 - 6.4          Diabetes: >6.4          Glycemic control for adults with diabetes: <7.0          Failed - Valid encounter within last 6 months    Recent Outpatient Visits   None            Passed - Cr in normal range and within 360 days    Creatinine, Ser  Date Value Ref Range Status  02/28/2023 1.00 0.76 - 1.27 mg/dL Final         Passed - eGFR in normal range and within 360 days    GFR calc Af Amer  Date Value Ref Range Status  06/02/2020 112 >59 mL/min/1.73 Final    Comment:    **[facility] currently reports eGFR in compliance with the current**   recommendations of the [employer]. [facility] will   update reporting as new guidelines are published from the NKF-ASN   Task force.    GFR calc non Af Amer  Date Value Ref Range Status  06/02/2020 97 >59 mL/min/1.73 Final   eGFR  Date Value Ref Range Status  02/28/2023 86 >59 mL/min/1.73 Final         Passed - B12 Level in normal range and within 720 days    Vitamin B-12  Date Value Ref Range Status  02/28/2023 1,010 232 - 1,245 pg/mL Final          Passed - CBC within normal limits and completed in the last 12 months    WBC  Date Value Ref Range Status  02/28/2023 8.5 3.4 - 10.8 x10E3/uL Final  02/20/2015 10.2 4.0 - 10.5 K/uL Final   RBC  Date Value Ref Range Status  02/28/2023 5.21 4.14 - 5.80 x10E6/uL Final  02/20/2015 4.09 (L) 4.22 - 5.81 MIL/uL Final   Hemoglobin  Date Value Ref Range Status  02/28/2023 14.7 13.0 - 17.7 g/dL Final   Hematocrit  Date Value Ref Range Status  02/28/2023 43.9 37.5 - 51.0 % Final   MCHC  Date Value Ref Range Status  02/28/2023 33.5 31.5 - 35.7 g/dL Final  16/05/9603 54.0 30.0 - 36.0 g/dL Final   [facility]  Date Value Ref Range Status  02/28/2023 28.2 26.6 - 33.0 pg Final  02/20/2015 28.6 26.0 - 34.0 pg Final   MCV  Date Value Ref  Range Status  02/28/2023 84 79 - 97 fL Final   No results found for: "PLTCOUNTKUC", "LABPLAT", "POCPLA" RDW  Date Value Ref Range Status  02/28/2023 12.3 11.6 - 15.4 % Final

## 2023-11-17 NOTE — Telephone Encounter (Signed)
 Copied from CRM [id number]. Topic: Clinical - Medication Refill >> Nov 17, 2023 10:23 AM Franchot Heidelberg wrote: Most Recent Primary Care Visit:  Provider: Aura Dials T  Department: [id number]  Visit Type: OFFICE VISIT  Date: 08/12/2023  Medication: metFORMIN (GLUCOPHAGE) 500 MG tablet  Has the patient contacted their pharmacy? Yes (Agent: If no, request that the patient contact the pharmacy for the refill. If patient does not wish to contact the pharmacy document the reason why and proceed with request.) (Agent: If yes, when and what did the pharmacy advise?)  Is this the correct pharmacy for this prescription? Yes If no, delete pharmacy and type the correct one.  This is the patient's preferred pharmacy:   [facility] [address] [place] - [address] [place] [address] Phone: [phone] Fax: [phone]  Has the prescription been filled recently? Yes  Is the patient out of the medication? Yes for 2 weeks   Has the patient been seen for an appointment in the last year OR does the patient have an upcoming appointment? Yes  Can we respond through MyChart? Yes  Agent: Please be advised that Rx refills may take up to 3 business days. We ask that you follow-up with your pharmacy.

## 2023-11-18 NOTE — Telephone Encounter (Signed)
 Appointment scheduled.

## 2023-12-11 NOTE — Patient Instructions (Signed)
Be Involved in Caring For Your Health:  Taking Medications When medications are taken as directed, they can greatly improve your health. But if they are not taken as prescribed, they may not work. In some cases, not taking them correctly can be harmful. To help ensure your treatment remains effective and safe, understand your medications and how to take them. Bring your medications to each visit for review by your provider.  Your lab results, notes, and after visit summary will be available on My Chart. We strongly encourage you to use this feature. If lab results are abnormal the clinic will contact you with the appropriate steps. If the clinic does not contact you assume the results are satisfactory. You can always view your results on My Chart. If you have questions regarding your health or results, please contact the clinic during office hours. You can also ask questions on My Chart.  We at [facility] are grateful that you chose [place] to provide your care. We strive to provide evidence-based and compassionate care and are always looking for feedback. If you get a survey from the clinic please complete this so we can hear your opinions.  Diabetes Mellitus and Exercise Regular exercise is important for your health, especially if you have diabetes mellitus. Exercise is not just about losing weight. It can also help you increase muscle strength and bone density and reduce body fat and stress. This can help your level of endurance and make you more fit and flexible. Why should I exercise if I have diabetes? Exercise has many benefits for people with diabetes. It can: Help lower and control your blood sugar (glucose). Help your body respond better and become more sensitive to the hormone insulin. Reduce how much insulin your body needs. Lower your risk for heart disease by: Lowering how much "bad" cholesterol and triglycerides you have in your body. Increasing how much "good" cholesterol  you have in your body. Lowering your blood pressure. Lowering your blood glucose levels. What is my activity plan? Your health care provider or an expert trained in diabetes care (certified diabetes educator) can help you make an activity plan. This plan can help you find the type of exercise that works for you. It may also tell you how often to exercise and for how long. Be sure to: Get at least 150 minutes of medium-intensity or high-intensity exercise each week. This may involve brisk walking, biking, or water aerobics. Do stretching and strengthening exercises at least 2 times a week. This may involve yoga or weight lifting. Spread out your activity over at least 3 days of the week. Get some form of physical activity each day. Do not go more than 2 days in a row without some kind of activity. Avoid being inactive for more than 30 minutes at a time. Take frequent breaks to walk or stretch. Choose activities that you enjoy. Set goals that you know you can accomplish. Start slowly and increase the intensity of your exercise over time. How do I manage my diabetes during exercise?  Monitor your blood glucose Check your blood glucose before and after you exercise. If your blood glucose is 240 mg/dL (40.9 mmol/L) or higher before you exercise, check your urine for ketones. These are chemicals created by the liver. If you have ketones in your urine, do not exercise until your blood glucose returns to normal. If your blood glucose is 100 mg/dL (5.6 mmol/L) or lower, eat a snack that has 15-20 grams of carbohydrate in  it. Check your blood glucose 15 minutes after the snack to make sure that your level is above 100 mg/dL (5.6 mmol/L) before you start to exercise. Your risk for low blood glucose (hypoglycemia) goes up during and after exercise. Know the symptoms of this condition and how to treat it. Follow these instructions at home: Keep a carbohydrate snack on hand for use before, during, and after  exercise. This can help prevent or treat hypoglycemia. Avoid injecting insulin into parts of your body that are going to be used during exercise. This may include: Your arms, when you are going to play tennis. Your legs, when you are about to go jogging. Keep track of your exercise habits. This can help you and your health care provider watch and adjust your activity plan. Write down: What you eat before and after you exercise. Blood glucose levels before and after you exercise. The type and amount of exercise you do. Talk to your health care provider before you start a new activity. They may need to: Make sure that the activity is safe for you. Adjust your insulin, other medicines, and food that you eat. Drink water while you exercise. This can stop you from losing too much water (dehydration). It can also prevent problems caused by having a lot of heat in your body (heat stroke). Where to find more information American Diabetes Association: diabetes.org Association of Diabetes Care & Education Specialists: diabeteseducator.org This information is not intended to replace advice given to you by your health care provider. Make sure you discuss any questions you have with your health care provider. Document Revised: 01/27/2022 Document Reviewed: 01/27/2022 Elsevier Patient Education  2024 [employer].

## 2023-12-15 ENCOUNTER — Encounter: Payer: Self-pay | Admitting: Nurse Practitioner

## 2023-12-15 ENCOUNTER — Ambulatory Visit (INDEPENDENT_AMBULATORY_CARE_PROVIDER_SITE_OTHER): Admitting: Nurse Practitioner

## 2023-12-15 VITALS — BP 130/78 | HR 73 | Temp 98.2°F | Ht 70.3 in | Wt 209.2 lb

## 2023-12-15 DIAGNOSIS — D518 Other vitamin B12 deficiency anemias: Secondary | ICD-10-CM | POA: Diagnosis not present

## 2023-12-15 DIAGNOSIS — E1169 Type 2 diabetes mellitus with other specified complication: Secondary | ICD-10-CM

## 2023-12-15 DIAGNOSIS — E785 Hyperlipidemia, unspecified: Secondary | ICD-10-CM | POA: Diagnosis not present

## 2023-12-15 DIAGNOSIS — E1159 Type 2 diabetes mellitus with other circulatory complications: Secondary | ICD-10-CM

## 2023-12-15 DIAGNOSIS — I700 Atherosclerosis of aorta: Secondary | ICD-10-CM | POA: Diagnosis not present

## 2023-12-15 DIAGNOSIS — Z85118 Personal history of other malignant neoplasm of bronchus and lung: Secondary | ICD-10-CM

## 2023-12-15 DIAGNOSIS — I2583 Coronary atherosclerosis due to lipid rich plaque: Secondary | ICD-10-CM

## 2023-12-15 DIAGNOSIS — J432 Centrilobular emphysema: Secondary | ICD-10-CM | POA: Diagnosis not present

## 2023-12-15 DIAGNOSIS — N400 Enlarged prostate without lower urinary tract symptoms: Secondary | ICD-10-CM | POA: Diagnosis not present

## 2023-12-15 DIAGNOSIS — Z87891 Personal history of nicotine dependence: Secondary | ICD-10-CM

## 2023-12-15 LAB — MICROALBUMIN, URINE WAIVED
Creatinine, Urine Waived: 100 mg/dL (ref 10–300)
Microalb, Ur Waived: 30 mg/L — ABNORMAL HIGH (ref 0–19)

## 2023-12-15 LAB — BAYER DCA HB A1C WAIVED: HB A1C (BAYER DCA - WAIVED): 7.4 % — ABNORMAL HIGH (ref 4.8–5.6)

## 2023-12-15 MED ORDER — BUDESONIDE-FORMOTEROL FUMARATE 160-4.5 MCG/ACT IN AERO: 2.0000 | INHALATION_SPRAY | Freq: Two times a day (BID) | RESPIRATORY_TRACT | 12 refills | Status: DC

## 2023-12-15 MED ORDER — METFORMIN HCL 500 MG PO TABS: ORAL_TABLET | ORAL | 3 refills | Status: DC

## 2023-12-15 MED ORDER — ROSUVASTATIN CALCIUM 40 MG PO TABS: 40.0000 mg | ORAL_TABLET | Freq: Every day | ORAL | 3 refills | Status: DC

## 2023-12-15 NOTE — Assessment & Plan Note (Signed)
Chronic.  Noted on lung screenings, continue to monitor and continue daily statin.  Consider addition of ASA in future. 

## 2023-12-15 NOTE — Assessment & Plan Note (Signed)
Chronic, ongoing.  Continue current medication regimen and adjust as needed.  CMP and lipid panel today. 

## 2023-12-15 NOTE — Progress Notes (Signed)
 BP 130/78 (BP Location: Left Arm, Patient Position: Sitting, Cuff Size: Normal)   Pulse 73   Temp 98.2 F (36.8 C) (Oral)   Ht 5' 10.3" (1.786 m)   Wt 209 lb 3.2 oz (94.9 kg)   SpO2 98%   BMI 29.76 kg/m    Subjective:    Patient ID: Timothy Benitez, male    DOB: 1961-05-29, 63 y.o.   MRN: [id number]  HPI: Timothy Benitez is a 63 y.o. male  Chief Complaint  Patient presents with   Diabetes   Hyperlipidemia   Benign Prostatic Hypertrophy   Emphysema   DIABETES July 2025 A1c 7.4%.  Takes Metformin  1000 MG morning and 500 MG evening.  Occasionally has some dietary indiscretions at work, fried foods.  Takes B12 at home for history of low levels Hypoglycemic episodes:no Polydipsia/polyuria: no Visual disturbance: no Chest pain: no Paresthesias: no Glucose Monitoring: yes  Accucheck frequency: occasionally, without Metformin  got to 170 range  Fasting glucose: on average 130-140  Post prandial:  Evening:  Before meals: Taking Insulin ?: no  Long acting insulin :  Short acting insulin : Blood Pressure Monitoring: not checking Retinal Examination: Up To Date - [employer] Foot Exam: Up to Date Pneumovax: Up to Date Influenza: Up to Date Aspirin: no   HYPERTENSION / HYPERLIPIDEMIA Takes Rosuvastatin  daily. Satisfied with current treatment? yes Duration of hypertension: chronic BP monitoring frequency: not checking BP range:  BP medication side effects: no Duration of hyperlipidemia: chronic Cholesterol medication side effects: no Cholesterol supplements: none Past cholesterol medications:  Medication compliance: good compliance Aspirin: no Recent stressors: no Recurrent headaches: no Visual changes: no Palpitations: no Dyspnea: no Chest pain: no Lower extremity edema: no Dizzy/lightheaded: no   COPD Uses Breyna  2 puffs BID and no Albuterol  use in years.  Had lung screening on 08/01/23 last noting emphysema and aortic atherosclerosis, no changes from  previous.  Quit smoking cigarettes in 2015 and cigars in 2016 -- history of lung cancer. COPD status: stable Satisfied with current treatment?: yes Oxygen use: no Dyspnea frequency: no Cough frequency: no Rescue inhaler frequency: has not used years Limitation of activity: no Productive cough: none Last Spirometry: 12/01/20 Pneumovax: Up to Date Influenza: Up to Date   Relevant past medical, surgical, family and social history reviewed and updated as indicated. Interim medical history since our last visit reviewed. Allergies and medications reviewed and updated.  Review of Systems  Constitutional:  Negative for activity change, diaphoresis, fatigue and fever.  Respiratory:  Negative for cough, chest tightness, shortness of breath and wheezing.   Cardiovascular:  Negative for chest pain, palpitations and leg swelling.  Gastrointestinal: Negative.   Endocrine: Negative.   Neurological: Negative.   Psychiatric/Behavioral: Negative.     Per HPI unless specifically indicated above     Objective:    BP 130/78 (BP Location: Left Arm, Patient Position: Sitting, Cuff Size: Normal)   Pulse 73   Temp 98.2 F (36.8 C) (Oral)   Ht 5' 10.3" (1.786 m)   Wt 209 lb 3.2 oz (94.9 kg)   SpO2 98%   BMI 29.76 kg/m   Wt Readings from Last 3 Encounters:  12/15/23 209 lb 3.2 oz (94.9 kg)  08/12/23 208 lb 9.6 oz (94.6 kg)  02/28/23 207 lb 12.8 oz (94.3 kg)    Physical Exam Vitals and nursing note reviewed.  Constitutional:      General: He is awake. He is not in acute distress.    Appearance: He is well-developed and well-groomed.  He is not ill-appearing.  HENT:     Head: Normocephalic and atraumatic.     Right Ear: Hearing normal. No drainage.     Left Ear: Hearing normal. No drainage.  Eyes:     General: Lids are normal.        Right eye: No discharge.        Left eye: No discharge.     Conjunctiva/sclera: Conjunctivae normal.     Pupils: Pupils are equal, round, and reactive to  light.  Neck:     Thyroid: No thyromegaly.     Vascular: No carotid bruit.     Trachea: Trachea normal.  Cardiovascular:     Rate and Rhythm: Normal rate and regular rhythm.     Heart sounds: Normal heart sounds, S1 normal and S2 normal. No murmur heard.    No gallop.  Pulmonary:     Effort: Pulmonary effort is normal. No accessory muscle usage or respiratory distress.     Breath sounds: Normal breath sounds.  Abdominal:     General: Bowel sounds are normal.     Palpations: Abdomen is soft.  Musculoskeletal:        General: Normal range of motion.     Cervical back: Normal range of motion and neck supple.     Right lower leg: No edema.     Left lower leg: No edema.  Lymphadenopathy:     Cervical: No cervical adenopathy.  Skin:    General: Skin is warm and dry.     Capillary Refill: Capillary refill takes less than 2 seconds.  Neurological:     Mental Status: He is alert and oriented to person, place, and time.     Deep Tendon Reflexes: Reflexes are normal and symmetric.  Psychiatric:        Attention and Perception: Attention normal.        Mood and Affect: Mood normal.        Speech: Speech normal.        Behavior: Behavior normal. Behavior is cooperative.        Thought Content: Thought content normal.    Diabetic Foot Exam - Simple   Simple Foot Form Visual Inspection No deformities, no ulcerations, no other skin breakdown bilaterally: Yes Sensation Testing Intact to touch and monofilament testing bilaterally: Yes Pulse Check Posterior Tibialis and Dorsalis pulse intact bilaterally: Yes Comments     Results for orders placed or performed in visit on 05/30/23  HM DIABETES EYE EXAM   Collection Time: 05/20/23 12:00 AM  Result Value Ref Range   HM Diabetic Eye Exam No Retinopathy No Retinopathy      Assessment & Plan:   Problem List Items Addressed This Visit       Cardiovascular and Mediastinum   Type 2 diabetes mellitus with cardiac complication (HCC) -  Primary   Chronic, stable.  Diagnosed October 2021. A1c today 7.4%, continues to have some dietary indiscretions.  Will continue Metformin  1000 MG in morning and 500 MG at night, he is tolerating this medication well.  Goal A1c <7% and educated him on sugar goals = fasting <130 and 2 hours after meal <180.  Recommend heavier focus on diet and if remains above goal next visit will need to adjust regimen.  Urine ALB 21 December 2023, would benefit ACE or ARB at very low dose, discussed with patient -- he wishes to hold off. Return to office in 6 months. - Eye and foot exams up to date -  Vaccines -- needs PCV20 - Needs ARB/ACE, refuses at present.  Statin on board.      Relevant Medications   metFORMIN  (GLUCOPHAGE ) 500 MG tablet   rosuvastatin  (CRESTOR ) 40 MG tablet   Other Relevant Orders   Bayer DCA Hb A1c Waived   Microalbumin, Urine Waived   TSH   Coronary atherosclerosis   Chronic.  Noted on past lung screening, continue daily statin and consider addition of ASA in future.      Relevant Medications   rosuvastatin  (CRESTOR ) 40 MG tablet   Aortic atherosclerosis (HCC)   Chronic.  Noted on lung screenings, continue to monitor and continue daily statin.  Consider addition of ASA in future.      Relevant Medications   rosuvastatin  (CRESTOR ) 40 MG tablet   Other Relevant Orders   Comprehensive metabolic panel with GFR   Lipid Panel w/o Chol/HDL Ratio     Respiratory   Centrilobular emphysema (HCC)   Chronic, ongoing.  Spirometry stable April 2022 and is tolerating inhalers.  Continue smoking cessation.  Will continue current inhaler regimen and adjust as needed.  Recommend he continue annual lung screening.      Relevant Medications   budesonide -formoterol  (BREYNA ) 160-4.5 MCG/ACT inhaler   Other Relevant Orders   CBC with Differential/Platelet     Endocrine   Hyperlipidemia associated with type 2 diabetes mellitus (HCC)   Chronic, ongoing.  Continue current medication regimen and  adjust as needed.  CMP and lipid panel today.      Relevant Medications   metFORMIN  (GLUCOPHAGE ) 500 MG tablet   rosuvastatin  (CRESTOR ) 40 MG tablet   Other Relevant Orders   Bayer DCA Hb A1c Waived   Comprehensive metabolic panel with GFR   Lipid Panel w/o Chol/HDL Ratio     Other   History of smoking   Continue complete cessation.  Will continue annual lung screening until age 33.      History of lung cancer   Stable.  Continue yearly lung screening and monitoring.  Continue smoking cessation.      B12 deficiency anemia   Chronic, ongoing.  Recommend he take supplement daily and adjust as needed.  Is on long term Metformin .      Relevant Orders   Vitamin B12   Other Visit Diagnoses       Benign prostatic hyperplasia without lower urinary tract symptoms       Check PSA today.   Relevant Orders   PSA        Follow up plan: Return in about 6 months (around 06/15/2024) for T2DM, HTN/HLD, COPD.

## 2023-12-15 NOTE — Assessment & Plan Note (Signed)
 Chronic, ongoing.  Recommend he take supplement daily and adjust as needed.  Is on long term Metformin .

## 2023-12-15 NOTE — Assessment & Plan Note (Signed)
Continue complete cessation.  Will continue annual lung screening until age 63. 

## 2023-12-15 NOTE — Assessment & Plan Note (Signed)
Chronic, ongoing.  Spirometry stable April 2022 and is tolerating inhalers.  Continue smoking cessation.  Will continue current inhaler regimen and adjust as needed.  Recommend he continue annual lung screening.

## 2023-12-15 NOTE — Assessment & Plan Note (Signed)
 Chronic, stable.  Diagnosed October 2021. A1c today 7.4%, continues to have some dietary indiscretions.  Will continue Metformin  1000 MG in morning and 500 MG at night, he is tolerating this medication well.  Goal A1c <7% and educated him on sugar goals = fasting <130 and 2 hours after meal <180.  Recommend heavier focus on diet and if remains above goal next visit will need to adjust regimen.  Urine ALB 21 December 2023, would benefit ACE or ARB at very low dose, discussed with patient -- he wishes to hold off. Return to office in 6 months. - Eye and foot exams up to date - Vaccines -- needs PCV20 - Needs ARB/ACE, refuses at present.  Statin on board.

## 2023-12-15 NOTE — Assessment & Plan Note (Signed)
Stable.  Continue yearly lung screening and monitoring.  Continue smoking cessation. 

## 2023-12-15 NOTE — Assessment & Plan Note (Signed)
Chronic.  Noted on past lung screening, continue daily statin and consider addition of ASA in future. 

## 2023-12-16 ENCOUNTER — Encounter: Payer: Self-pay | Admitting: Nurse Practitioner

## 2023-12-16 LAB — COMPREHENSIVE METABOLIC PANEL WITH GFR
ALT: 27 IU/L (ref 0–44)
AST: 18 IU/L (ref 0–40)
Albumin: 4.3 g/dL (ref 3.8–4.9)
Alkaline Phosphatase: 65 IU/L (ref 44–121)
BUN/Creatinine Ratio: 20 (ref 10–24)
BUN: 15 mg/dL (ref 8–27)
Bilirubin Total: 0.8 mg/dL (ref 0.0–1.2)
CO2: 24 mmol/L (ref 20–29)
Calcium: 9.2 mg/dL (ref 8.6–10.2)
Chloride: 100 mmol/L (ref 96–106)
Creatinine, Ser: 0.76 mg/dL (ref 0.76–1.27)
Globulin, Total: 1.6 g/dL (ref 1.5–4.5)
Glucose: 129 mg/dL — ABNORMAL HIGH (ref 70–99)
Potassium: 4.7 mmol/L (ref 3.5–5.2)
Sodium: 139 mmol/L (ref 134–144)
Total Protein: 5.9 g/dL — ABNORMAL LOW (ref 6.0–8.5)
eGFR: 103 mL/min/{1.73_m2} (ref >59)

## 2023-12-16 LAB — CBC WITH DIFFERENTIAL/PLATELET
Basophils Absolute: 0 10*3/uL (ref 0.0–0.2)
Basos: 1 %
EOS (ABSOLUTE): 0.2 10*3/uL (ref 0.0–0.4)
Eos: 2 %
Hematocrit: 43.7 % (ref 37.5–51.0)
Hemoglobin: 14.4 g/dL (ref 13.0–17.7)
Immature Grans (Abs): 0 10*3/uL (ref 0.0–0.1)
Immature Granulocytes: 0 %
Lymphocytes Absolute: 1.6 10*3/uL (ref 0.7–3.1)
Lymphs: 24 %
MCH: 28.6 pg (ref 26.6–33.0)
MCHC: 33 g/dL (ref 31.5–35.7)
MCV: 87 fL (ref 79–97)
Monocytes Absolute: 0.5 10*3/uL (ref 0.1–0.9)
Monocytes: 8 %
Neutrophils Absolute: 4.2 10*3/uL (ref 1.4–7.0)
Neutrophils: 65 %
Platelets: 205 10*3/uL (ref 150–450)
RBC: 5.04 x10E6/uL (ref 4.14–5.80)
RDW: 12.5 % (ref 11.6–15.4)
WBC: 6.6 10*3/uL (ref 3.4–10.8)

## 2023-12-16 LAB — LIPID PANEL W/O CHOL/HDL RATIO
Cholesterol, Total: 161 mg/dL (ref 100–199)
HDL: 55 mg/dL (ref >39)
LDL Chol Calc (NIH): 88 mg/dL (ref 0–99)
Triglycerides: 97 mg/dL (ref 0–149)
VLDL Cholesterol Cal: 18 mg/dL (ref 5–40)

## 2023-12-16 LAB — TSH: TSH: 1.06 u[IU]/mL (ref 0.450–4.500)

## 2023-12-16 LAB — VITAMIN B12: Vitamin B-12: 959 pg/mL (ref 232–1245)

## 2023-12-16 LAB — PSA: Prostate Specific Ag, Serum: 1.1 ng/mL (ref 0.0–4.0)

## 2023-12-16 NOTE — Progress Notes (Signed)
 Contacted via MyChart   Good afternoon Timothy Benitez, your labs have returned: - Kidney function, creatinine and eGFR, remains normal, as is liver function, AST and ALT.  - Remainder of labs are stable.  No medication changes needed. Any questions? Keep being amazing!!  Thank you for allowing me to participate in your care.  I appreciate you. Kindest regards, Vrinda Heckstall

## 2024-02-27 ENCOUNTER — Other Ambulatory Visit: Payer: Self-pay | Admitting: Nurse Practitioner

## 2024-03-01 ENCOUNTER — Other Ambulatory Visit: Payer: Self-pay | Admitting: Nurse Practitioner

## 2024-03-01 NOTE — Telephone Encounter (Unsigned)
 Copied from CRM [id number]. Topic: Clinical - Prescription Issue >> Mar 01, 2024 11:37 AM Powell HERO wrote: Reason for CRM: budesonide -formoterol  (BREYNA ) 160-4.5 MCG/ACT inhaler, patient is out of his inhaler. Refill note states he has requested a refill too soon. Doesn't;t know how to proceed. Pharmacy informed patient prescription was denied.

## 2024-03-01 NOTE — Telephone Encounter (Signed)
 Too soon for refill refilled on 11/23/23 for 1 and 12 RF.  Requested Prescriptions  Pending Prescriptions Disp Refills   BREYNA  160-4.5 MCG/ACT inhaler [Pharmacy Med Name: Breyna  160-4.5 MCG/ACT Inhalation Aerosol] 11 g 0    Sig: INHALE 2 PUFFS TWICE DAILY     Pulmonology:  Combination Products Passed - 03/01/2024  8:37 AM      Passed - Valid encounter within last 12 months    Recent Outpatient Visits           2 months ago Type 2 diabetes mellitus with cardiac complication [facility])    [facility], Melanie DASEN, NP

## 2024-03-01 NOTE — Telephone Encounter (Signed)
 [facility] called and spoke to [place], [facility] about the refill(s) budesonide -formoterol  requested. Advised it was sent on 12/15/23 #1 each/12 refill(s). She says there were 2 on file, one out of date and this one above was able to go through for refill to pick up today.

## 2024-06-17 NOTE — Patient Instructions (Signed)

## 2024-06-18 ENCOUNTER — Ambulatory Visit: Admitting: Nurse Practitioner

## 2024-06-21 ENCOUNTER — Other Ambulatory Visit: Payer: Self-pay | Admitting: Medical Genetics

## 2024-06-21 ENCOUNTER — Ambulatory Visit (INDEPENDENT_AMBULATORY_CARE_PROVIDER_SITE_OTHER): Admitting: Nurse Practitioner

## 2024-06-21 ENCOUNTER — Encounter: Payer: Self-pay | Admitting: Nurse Practitioner

## 2024-06-21 VITALS — BP 131/76 | HR 70 | Temp 98.6°F | Ht 70.2 in | Wt 210.4 lb

## 2024-06-21 DIAGNOSIS — E1159 Type 2 diabetes mellitus with other circulatory complications: Secondary | ICD-10-CM | POA: Diagnosis not present

## 2024-06-21 DIAGNOSIS — Z1211 Encounter for screening for malignant neoplasm of colon: Secondary | ICD-10-CM

## 2024-06-21 DIAGNOSIS — E1169 Type 2 diabetes mellitus with other specified complication: Secondary | ICD-10-CM | POA: Diagnosis not present

## 2024-06-21 DIAGNOSIS — I700 Atherosclerosis of aorta: Secondary | ICD-10-CM | POA: Diagnosis not present

## 2024-06-21 DIAGNOSIS — E785 Hyperlipidemia, unspecified: Secondary | ICD-10-CM

## 2024-06-21 DIAGNOSIS — Z85118 Personal history of other malignant neoplasm of bronchus and lung: Secondary | ICD-10-CM

## 2024-06-21 DIAGNOSIS — J432 Centrilobular emphysema: Secondary | ICD-10-CM

## 2024-06-21 LAB — BAYER DCA HB A1C WAIVED: HB A1C (BAYER DCA - WAIVED): 6.9 % — ABNORMAL HIGH (ref 4.8–5.6)

## 2024-06-21 NOTE — Assessment & Plan Note (Signed)
 Chronic, stable.  Diagnosed October 2021. A1c today 6.9%, trend down.  Will continue Metformin  1000 MG in morning and 500 MG at night, he is tolerating this medication well.  Goal A1c <7% and educated him on sugar goals = fasting <130 and 2 hours after meal <180.  Recommend heavier focus on diet and if remains above goal next visit will need to adjust regimen.  Urine ALB 21 December 2023, would benefit ACE or ARB at very low dose, discussed with patient -- he wishes to hold off. Return to office in 6 months. - Eye and foot exams up to date - Vaccines -- needs PCV20 and flu - refuses - Needs ARB/ACE, refuses at present.  Statin on board.

## 2024-06-21 NOTE — Progress Notes (Signed)
 BP 131/76   Pulse 70   Temp 98.6 F (37 C) (Oral)   Ht 5' 10.2 (1.783 m)   Wt 210 lb 6.4 oz (95.4 kg)   SpO2 98%   BMI 30.02 kg/m    Subjective:    Patient ID: Timothy Benitez, male    DOB: 09-Jan-1961, 63 y.o.   MRN: [id number]  HPI: Timothy Benitez is a 63 y.o. male  Chief Complaint  Patient presents with   COPD   Diabetes    No recent eye exam per patient   Hyperlipidemia   Hypertension   DIABETES A1c April 7.4%.  Continues Metformin  1000 MG morning and 500 MG evening.  Continues B12 at home for history of low levels. Diet is getting a little better at home as wife is seeing nutritionist.   Hypoglycemic episodes:no Polydipsia/polyuria: no Visual disturbance: no Chest pain: no Paresthesias: no Glucose Monitoring: yes  Accucheck frequency: occasionally  Fasting glucose: on average 130-140  Post prandial:  Evening:  Before meals: Taking Insulin ?: no  Long acting insulin :  Short acting insulin : Blood Pressure Monitoring: not checking Retinal Examination: Not Up To Date - [employer] Exam: Up to Date Pneumovax: Up to Date Influenza: Up to Date Aspirin: no   HYPERTENSION / HYPERLIPIDEMIA Continues Rosuvastatin  daily. Satisfied with current treatment? yes Duration of hypertension: chronic BP monitoring frequency: occasionally BP range: <130/80 on average BP medication side effects: no Duration of hyperlipidemia: chronic Cholesterol medication side effects: no Cholesterol supplements: none Past cholesterol medications:  Medication compliance: good compliance Aspirin: no Recent stressors: no Recurrent headaches: no Visual changes: no Palpitations: no Dyspnea: no Chest pain: no Lower extremity edema: no Dizzy/lightheaded: no  The 10-year ASCVD risk score (Arnett DK, et al., 2019) is: 14.1%   Values used to calculate the score:     Age: 40 years     Clincally relevant sex: Male     Is Non-Hispanic African American: No     Diabetic: Yes      Tobacco smoker: No     Systolic Blood Pressure: 131 mmHg     Is BP treated: No     HDL Cholesterol: 55 mg/dL     Total Cholesterol: 161 mg/dL  COPD Continues Breyna  2 puffs BID, no Albuterol .  Lung screening on 08/01/23 last noted emphysema and aortic atherosclerosis, no changes from previous.  Quit smoking cigarettes in 2015 and cigars in 2016 -- history of lung cancer. COPD status: stable Satisfied with current treatment?: yes Oxygen use: no Dyspnea frequency: no Cough frequency: no Rescue inhaler frequency: no Limitation of activity: no Productive cough: none Last Spirometry: 12/01/20 Pneumovax: Up to Date Influenza: Up to Date   Relevant past medical, surgical, family and social history reviewed and updated as indicated. Interim medical history since our last visit reviewed. Allergies and medications reviewed and updated.  Review of Systems  Constitutional:  Negative for activity change, diaphoresis, fatigue and fever.  Respiratory:  Negative for cough, chest tightness, shortness of breath and wheezing.   Cardiovascular:  Negative for chest pain, palpitations and leg swelling.  Gastrointestinal: Negative.   Endocrine: Negative.   Neurological: Negative.   Psychiatric/Behavioral: Negative.     Per HPI unless specifically indicated above     Objective:    BP 131/76   Pulse 70   Temp 98.6 F (37 C) (Oral)   Ht 5' 10.2 (1.783 m)   Wt 210 lb 6.4 oz (95.4 kg)   SpO2 98%   BMI 30.02  kg/m   Wt Readings from Last 3 Encounters:  06/21/24 210 lb 6.4 oz (95.4 kg)  12/15/23 209 lb 3.2 oz (94.9 kg)  08/12/23 208 lb 9.6 oz (94.6 kg)    Physical Exam Vitals and nursing note reviewed.  Constitutional:      General: He is awake. He is not in acute distress.    Appearance: He is well-developed and well-groomed. He is not ill-appearing.  HENT:     Head: Normocephalic and atraumatic.     Right Ear: Hearing normal. No drainage.     Left Ear: Hearing normal. No drainage.   Eyes:     General: Lids are normal.        Right eye: No discharge.        Left eye: No discharge.     Conjunctiva/sclera: Conjunctivae normal.     Pupils: Pupils are equal, round, and reactive to light.  Neck:     Thyroid: No thyromegaly.     Vascular: No carotid bruit.     Trachea: Trachea normal.  Cardiovascular:     Rate and Rhythm: Normal rate and regular rhythm.     Heart sounds: Normal heart sounds, S1 normal and S2 normal. No murmur heard.    No gallop.  Pulmonary:     Effort: Pulmonary effort is normal. No accessory muscle usage or respiratory distress.     Breath sounds: Normal breath sounds.  Abdominal:     General: Bowel sounds are normal.     Palpations: Abdomen is soft.  Musculoskeletal:        General: Normal range of motion.     Cervical back: Normal range of motion and neck supple.     Right lower leg: No edema.     Left lower leg: No edema.  Lymphadenopathy:     Cervical: No cervical adenopathy.  Skin:    General: Skin is warm and dry.     Capillary Refill: Capillary refill takes less than 2 seconds.  Neurological:     Mental Status: He is alert and oriented to person, place, and time.     Deep Tendon Reflexes: Reflexes are normal and symmetric.  Psychiatric:        Attention and Perception: Attention normal.        Mood and Affect: Mood normal.        Speech: Speech normal.        Behavior: Behavior normal. Behavior is cooperative.        Thought Content: Thought content normal.    Results for orders placed or performed in visit on 12/15/23  Bayer DCA Hb A1c Waived   Collection Time: 12/15/23  8:32 AM  Result Value Ref Range   HB A1C (BAYER DCA - WAIVED) 7.4 (H) 4.8 - 5.6 %  Microalbumin, Urine Waived   Collection Time: 12/15/23  8:32 AM  Result Value Ref Range   Microalb, Ur Waived 30 (H) 0 - 19 mg/L   Creatinine, Urine Waived 100 10 - 300 mg/dL   Microalb/Creat Ratio 30-300 (H) <30 mg/g  CBC with Differential/Platelet   Collection Time:  12/15/23  8:34 AM  Result Value Ref Range   WBC 6.6 3.4 - 10.8 x10E3/uL   RBC 5.04 4.14 - 5.80 x10E6/uL   Hemoglobin 14.4 13.0 - 17.7 g/dL   Hematocrit 56.2 62.4 - 51.0 %   MCV 87 79 - 97 fL   MCH 28.6 26.6 - 33.0 pg   MCHC 33.0 31.5 - 35.7 g/dL  RDW 12.5 11.6 - 15.4 %   Platelets 205 150 - 450 x10E3/uL   Neutrophils 65 Not Estab. %   Lymphs 24 Not Estab. %   Monocytes 8 Not Estab. %   Eos 2 Not Estab. %   Basos 1 Not Estab. %   Neutrophils Absolute 4.2 1.4 - 7.0 x10E3/uL   Lymphocytes Absolute 1.6 0.7 - 3.1 x10E3/uL   Monocytes Absolute 0.5 0.1 - 0.9 x10E3/uL   EOS (ABSOLUTE) 0.2 0.0 - 0.4 x10E3/uL   Basophils Absolute 0.0 0.0 - 0.2 x10E3/uL   Immature Granulocytes 0 Not Estab. %   Immature Grans (Abs) 0.0 0.0 - 0.1 x10E3/uL  Comprehensive metabolic panel with GFR   Collection Time: 12/15/23  8:34 AM  Result Value Ref Range   Glucose 129 (H) 70 - 99 mg/dL   BUN 15 8 - 27 mg/dL   Creatinine, Ser 9.23 0.76 - 1.27 mg/dL   eGFR 896 >40 fO/fpw/8.26   BUN/Creatinine Ratio 20 10 - 24   Sodium 139 134 - 144 mmol/L   Potassium 4.7 3.5 - 5.2 mmol/L   Chloride 100 96 - 106 mmol/L   CO2 24 20 - 29 mmol/L   Calcium  9.2 8.6 - 10.2 mg/dL   Total Protein 5.9 (L) 6.0 - 8.5 g/dL   Albumin 4.3 3.8 - 4.9 g/dL   Globulin, Total 1.6 1.5 - 4.5 g/dL   Bilirubin Total 0.8 0.0 - 1.2 mg/dL   Alkaline Phosphatase 65 44 - 121 IU/L   AST 18 0 - 40 IU/L   ALT 27 0 - 44 IU/L  Lipid Panel w/o Chol/HDL Ratio   Collection Time: 12/15/23  8:34 AM  Result Value Ref Range   Cholesterol, Total 161 100 - 199 mg/dL   Triglycerides 97 0 - 149 mg/dL   HDL 55 >60 mg/dL   VLDL Cholesterol Cal 18 5 - 40 mg/dL   LDL Chol Calc (NIH) 88 0 - 99 mg/dL  TSH   Collection Time: 12/15/23  8:34 AM  Result Value Ref Range   TSH 1.060 0.450 - 4.500 uIU/mL  PSA   Collection Time: 12/15/23  8:34 AM  Result Value Ref Range   Prostate Specific Ag, Serum 1.1 0.0 - 4.0 ng/mL  Vitamin B12   Collection Time: 12/15/23   8:34 AM  Result Value Ref Range   Vitamin B-12 959 232 - 1,245 pg/mL      Assessment & Plan:   Problem List Items Addressed This Visit       Cardiovascular and Mediastinum   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  Diagnosed October 2021. A1c today 6.9%, trend down.  Will continue Metformin  1000 MG in morning and 500 MG at night, he is tolerating this medication well.  Goal A1c <7% and educated him on sugar goals = fasting <130 and 2 hours after meal <180.  Recommend heavier focus on diet and if remains above goal next visit will need to adjust regimen.  Urine ALB 21 December 2023, would benefit ACE or ARB at very low dose, discussed with patient -- he wishes to hold off. Return to office in 6 months. - Eye and foot exams up to date - Vaccines -- needs PCV20 and flu - refuses - Needs ARB/ACE, refuses at present.  Statin on board.      Relevant Orders   Bayer DCA Hb A1c Waived   Aortic atherosclerosis   Chronic.  Noted on lung screenings, continue  to monitor and continue daily statin.  Consider addition of ASA in future.        Respiratory   Centrilobular emphysema (HCC)   Chronic, ongoing.  Spirometry stable April 2022 and is tolerating inhalers.  Continue smoking cessation.  Will continue current inhaler regimen and adjust as needed.  Recommend he continue annual lung screening.        Endocrine   Hyperlipidemia associated with type 2 diabetes mellitus (HCC)   Chronic, ongoing.  Continue current medication regimen and adjust as needed.  CMP and lipid panel today.      Relevant Orders   Bayer DCA Hb A1c Waived   Comprehensive metabolic panel with GFR   Lipid Panel w/o Chol/HDL Ratio     Other   History of lung cancer   Stable.  Continue yearly lung screening and monitoring.  Continue smoking cessation.      Other Visit Diagnoses       Colon cancer screening       Cologuard ordered.   Relevant Orders   Cologuard         Follow up plan: Return in about 6 months (around 12/20/2024) for Annual Physical after 12/10/24.

## 2024-06-21 NOTE — Assessment & Plan Note (Signed)
Chronic, ongoing.  Continue current medication regimen and adjust as needed.  CMP and lipid panel today. 

## 2024-06-21 NOTE — Assessment & Plan Note (Signed)
Chronic, ongoing.  Spirometry stable April 2022 and is tolerating inhalers.  Continue smoking cessation.  Will continue current inhaler regimen and adjust as needed.  Recommend he continue annual lung screening.

## 2024-06-21 NOTE — Assessment & Plan Note (Signed)
Chronic.  Noted on lung screenings, continue to monitor and continue daily statin.  Consider addition of ASA in future. 

## 2024-06-21 NOTE — Assessment & Plan Note (Signed)
Stable.  Continue yearly lung screening and monitoring.  Continue smoking cessation. 

## 2024-06-22 ENCOUNTER — Ambulatory Visit: Payer: Self-pay | Admitting: Nurse Practitioner

## 2024-06-22 LAB — COMPREHENSIVE METABOLIC PANEL WITH GFR
ALT: 27 IU/L (ref 0–44)
AST: 16 IU/L (ref 0–40)
Albumin: 4.6 g/dL (ref 3.9–4.9)
Alkaline Phosphatase: 60 IU/L (ref 47–123)
BUN/Creatinine Ratio: 15 (ref 10–24)
BUN: 12 mg/dL (ref 8–27)
Bilirubin Total: 0.8 mg/dL (ref 0.0–1.2)
CO2: 23 mmol/L (ref 20–29)
Calcium: 9.4 mg/dL (ref 8.6–10.2)
Chloride: 98 mmol/L (ref 96–106)
Creatinine, Ser: 0.82 mg/dL (ref 0.76–1.27)
Globulin, Total: 1.9 g/dL (ref 1.5–4.5)
Glucose: 169 mg/dL — ABNORMAL HIGH (ref 70–99)
Potassium: 4.5 mmol/L (ref 3.5–5.2)
Sodium: 136 mmol/L (ref 134–144)
Total Protein: 6.5 g/dL (ref 6.0–8.5)
eGFR: 100 mL/min/1.73 (ref >59)

## 2024-06-22 LAB — LIPID PANEL W/O CHOL/HDL RATIO
Cholesterol, Total: 160 mg/dL (ref 100–199)
HDL: 64 mg/dL (ref >39)
LDL Chol Calc (NIH): 76 mg/dL (ref 0–99)
Triglycerides: 114 mg/dL (ref 0–149)
VLDL Cholesterol Cal: 20 mg/dL (ref 5–40)

## 2024-06-22 NOTE — Progress Notes (Signed)
 Contacted via MyChart  Good afternoon Timothy Benitez, your labs have returned and overall are stable. They only recommendation I would have is to add on something like Zetia daily with your Rosuvastatin  to get your LDL <70. Something to think about and if you wish to try this let me know. Any questions? Keep being amazing!!  Thank you for allowing me to participate in your care.  I appreciate you. Kindest regards, Maninder Deboer

## 2024-06-25 ENCOUNTER — Other Ambulatory Visit
Admission: RE | Admit: 2024-06-25 | Discharge: 2024-06-25 | Disposition: A | Discharge status: Home / Self Care | Payer: Self-pay | Source: Ambulatory Visit | Attending: Medical Genetics | Admitting: Medical Genetics

## 2024-07-03 LAB — GENECONNECT MOLECULAR SCREEN: Genetic Analysis Overall Interpretation: NEGATIVE

## 2024-07-13 LAB — COLOGUARD

## 2024-07-13 NOTE — Progress Notes (Signed)
 Contacted via MyChart  Cologuard could not be processed, they will be sending new kit.

## 2024-07-23 ENCOUNTER — Telehealth: Payer: Self-pay | Admitting: Nurse Practitioner

## 2024-07-31 NOTE — Telephone Encounter (Signed)
 error

## 2024-08-02 ENCOUNTER — Ambulatory Visit
Admission: RE | Admit: 2024-08-02 | Discharge: 2024-08-02 | Disposition: A | Discharge status: Home / Self Care | Source: Ambulatory Visit | Attending: Acute Care | Admitting: Acute Care

## 2024-08-02 DIAGNOSIS — Z87891 Personal history of nicotine dependence: Secondary | ICD-10-CM | POA: Diagnosis present

## 2024-08-02 DIAGNOSIS — Z122 Encounter for screening for malignant neoplasm of respiratory organs: Secondary | ICD-10-CM | POA: Diagnosis not present

## 2024-08-06 MED ORDER — ROSUVASTATIN CALCIUM 40 MG PO TABS: 40.0000 mg | ORAL_TABLET | Freq: Every day | ORAL | 3 refills | Status: AC

## 2024-08-06 MED ORDER — METFORMIN HCL 500 MG PO TABS: ORAL_TABLET | ORAL | 3 refills | Status: AC

## 2024-08-06 MED ORDER — BUDESONIDE-FORMOTEROL FUMARATE 160-4.5 MCG/ACT IN AERO: 2.0000 | INHALATION_SPRAY | Freq: Two times a day (BID) | RESPIRATORY_TRACT | 12 refills | Status: AC

## 2024-08-07 ENCOUNTER — Telehealth: Payer: Self-pay | Admitting: Pharmacy Technician

## 2024-08-07 ENCOUNTER — Other Ambulatory Visit (HOSPITAL_COMMUNITY): Payer: Self-pay

## 2024-08-07 ENCOUNTER — Other Ambulatory Visit: Payer: Self-pay | Admitting: Acute Care

## 2024-08-07 DIAGNOSIS — Z122 Encounter for screening for malignant neoplasm of respiratory organs: Secondary | ICD-10-CM

## 2024-08-07 DIAGNOSIS — Z87891 Personal history of nicotine dependence: Secondary | ICD-10-CM

## 2024-08-07 NOTE — Telephone Encounter (Signed)
 Pharmacy Patient Advocate Encounter   Received notification from Onbase that prior authorization for Breyna  160-4.5MCG/ACT aerosol is required/requested.   Insurance verification completed.   The patient is insured through [employer].   Per test claim: Refill too soon. PA is not needed at this time. Medication was filled 08/06/24. Next eligible fill date is 08/28/2024.     **This was filled via mail order on 08/06/24 and pharmacy can call pharmacy helpdesk to ask for override if the patient hasn't received inhaler yet**

## 2024-08-08 ENCOUNTER — Other Ambulatory Visit (HOSPITAL_COMMUNITY): Payer: Self-pay

## 2024-12-19 ENCOUNTER — Encounter: Admitting: Nurse Practitioner
